# Patient Record
Sex: Male | Born: 1945 | Race: White | Hispanic: No | Smoking: Never smoker
Health system: Southern US, Community
[De-identification: ages and names within clinical notes are randomized; demographics above are authoritative.]

## PROBLEM LIST (undated history)

## (undated) DIAGNOSIS — M5126 Other intervertebral disc displacement, lumbar region: Secondary | ICD-10-CM

## (undated) DIAGNOSIS — I10 Essential (primary) hypertension: Secondary | ICD-10-CM

## (undated) DIAGNOSIS — M25569 Pain in unspecified knee: Secondary | ICD-10-CM

## (undated) DIAGNOSIS — G8929 Other chronic pain: Secondary | ICD-10-CM

## (undated) DIAGNOSIS — K219 Gastro-esophageal reflux disease without esophagitis: Secondary | ICD-10-CM

## (undated) DIAGNOSIS — F32A Depression, unspecified: Secondary | ICD-10-CM

## (undated) DIAGNOSIS — F329 Major depressive disorder, single episode, unspecified: Secondary | ICD-10-CM

## (undated) HISTORY — PX: KNEE SURGERY: SHX244

---

## 2004-09-15 ENCOUNTER — Ambulatory Visit: Payer: Self-pay | Admitting: Family Medicine

## 2004-10-06 ENCOUNTER — Ambulatory Visit: Payer: Self-pay | Admitting: Family Medicine

## 2004-11-09 ENCOUNTER — Ambulatory Visit: Payer: Self-pay | Admitting: Family Medicine

## 2004-12-10 ENCOUNTER — Ambulatory Visit: Payer: Self-pay | Admitting: Family Medicine

## 2005-02-04 ENCOUNTER — Ambulatory Visit: Payer: Self-pay | Admitting: Family Medicine

## 2011-02-17 ENCOUNTER — Emergency Department (HOSPITAL_COMMUNITY): Payer: Non-veteran care

## 2011-02-17 ENCOUNTER — Encounter: Payer: Self-pay | Admitting: *Deleted

## 2011-02-17 ENCOUNTER — Other Ambulatory Visit: Payer: Self-pay

## 2011-02-17 ENCOUNTER — Inpatient Hospital Stay (HOSPITAL_COMMUNITY): Payer: Non-veteran care

## 2011-02-17 ENCOUNTER — Inpatient Hospital Stay (HOSPITAL_COMMUNITY)
Admission: EM | Admit: 2011-02-17 | Discharge: 2011-02-20 | Disposition: A | Discharge status: Nursing Home (Includes ICF) | Payer: Non-veteran care | Source: Home / Self Care | Attending: General Surgery | Admitting: General Surgery

## 2011-02-17 DIAGNOSIS — E876 Hypokalemia: Secondary | ICD-10-CM | POA: Diagnosis not present

## 2011-02-17 DIAGNOSIS — F329 Major depressive disorder, single episode, unspecified: Secondary | ICD-10-CM | POA: Diagnosis present

## 2011-02-17 DIAGNOSIS — N189 Chronic kidney disease, unspecified: Secondary | ICD-10-CM | POA: Diagnosis not present

## 2011-02-17 DIAGNOSIS — F3289 Other specified depressive episodes: Secondary | ICD-10-CM | POA: Diagnosis present

## 2011-02-17 DIAGNOSIS — K922 Gastrointestinal hemorrhage, unspecified: Secondary | ICD-10-CM

## 2011-02-17 DIAGNOSIS — I129 Hypertensive chronic kidney disease with stage 1 through stage 4 chronic kidney disease, or unspecified chronic kidney disease: Secondary | ICD-10-CM | POA: Diagnosis present

## 2011-02-17 DIAGNOSIS — E872 Acidosis, unspecified: Secondary | ICD-10-CM | POA: Diagnosis not present

## 2011-02-17 DIAGNOSIS — T8183XA Persistent postprocedural fistula, initial encounter: Secondary | ICD-10-CM | POA: Diagnosis not present

## 2011-02-17 DIAGNOSIS — R55 Syncope and collapse: Secondary | ICD-10-CM

## 2011-02-17 DIAGNOSIS — Y849 Medical procedure, unspecified as the cause of abnormal reaction of the patient, or of later complication, without mention of misadventure at the time of the procedure: Secondary | ICD-10-CM | POA: Diagnosis present

## 2011-02-17 DIAGNOSIS — K659 Peritonitis, unspecified: Secondary | ICD-10-CM | POA: Diagnosis not present

## 2011-02-17 DIAGNOSIS — K265 Chronic or unspecified duodenal ulcer with perforation: Principal | ICD-10-CM | POA: Diagnosis present

## 2011-02-17 DIAGNOSIS — S0100XA Unspecified open wound of scalp, initial encounter: Secondary | ICD-10-CM | POA: Diagnosis present

## 2011-02-17 DIAGNOSIS — E871 Hypo-osmolality and hyponatremia: Secondary | ICD-10-CM | POA: Diagnosis not present

## 2011-02-17 DIAGNOSIS — I5032 Chronic diastolic (congestive) heart failure: Secondary | ICD-10-CM | POA: Diagnosis present

## 2011-02-17 DIAGNOSIS — R6520 Severe sepsis without septic shock: Secondary | ICD-10-CM | POA: Diagnosis not present

## 2011-02-17 DIAGNOSIS — S0101XA Laceration without foreign body of scalp, initial encounter: Secondary | ICD-10-CM

## 2011-02-17 DIAGNOSIS — Y9210 Unspecified residential institution as the place of occurrence of the external cause: Secondary | ICD-10-CM | POA: Diagnosis not present

## 2011-02-17 DIAGNOSIS — T82898A Other specified complication of vascular prosthetic devices, implants and grafts, initial encounter: Secondary | ICD-10-CM | POA: Diagnosis not present

## 2011-02-17 DIAGNOSIS — J9600 Acute respiratory failure, unspecified whether with hypoxia or hypercapnia: Secondary | ICD-10-CM | POA: Diagnosis not present

## 2011-02-17 DIAGNOSIS — D649 Anemia, unspecified: Secondary | ICD-10-CM

## 2011-02-17 DIAGNOSIS — E8809 Other disorders of plasma-protein metabolism, not elsewhere classified: Secondary | ICD-10-CM | POA: Diagnosis present

## 2011-02-17 DIAGNOSIS — K631 Perforation of intestine (nontraumatic): Secondary | ICD-10-CM

## 2011-02-17 DIAGNOSIS — A419 Sepsis, unspecified organism: Secondary | ICD-10-CM | POA: Diagnosis not present

## 2011-02-17 DIAGNOSIS — I509 Heart failure, unspecified: Secondary | ICD-10-CM | POA: Diagnosis present

## 2011-02-17 DIAGNOSIS — Y832 Surgical operation with anastomosis, bypass or graft as the cause of abnormal reaction of the patient, or of later complication, without mention of misadventure at the time of the procedure: Secondary | ICD-10-CM | POA: Diagnosis not present

## 2011-02-17 DIAGNOSIS — D62 Acute posthemorrhagic anemia: Secondary | ICD-10-CM | POA: Diagnosis not present

## 2011-02-17 DIAGNOSIS — T148XXA Other injury of unspecified body region, initial encounter: Secondary | ICD-10-CM

## 2011-02-17 DIAGNOSIS — W19XXXA Unspecified fall, initial encounter: Secondary | ICD-10-CM | POA: Diagnosis present

## 2011-02-17 HISTORY — DX: Depression, unspecified: F32.A

## 2011-02-17 HISTORY — DX: Major depressive disorder, single episode, unspecified: F32.9

## 2011-02-17 LAB — COMPREHENSIVE METABOLIC PANEL
BUN: 37 mg/dL — ABNORMAL HIGH (ref 6–23)
Calcium: 10.8 mg/dL — ABNORMAL HIGH (ref 8.4–10.5)
Creatinine, Ser: 1.54 mg/dL — ABNORMAL HIGH (ref 0.50–1.35)
GFR calc Af Amer: 53 mL/min — ABNORMAL LOW (ref >90)
Glucose, Bld: 183 mg/dL — ABNORMAL HIGH (ref 70–99)
Total Protein: 5.7 g/dL — ABNORMAL LOW (ref 6.0–8.3)

## 2011-02-17 LAB — BLOOD GAS, ARTERIAL
Acid-base deficit: 11.1 mmol/L — ABNORMAL HIGH (ref 0.0–2.0)
Drawn by: 21694
O2 Content: 2 L/min
Patient temperature: 37
pCO2 arterial: 36.3 mmHg (ref 35.0–45.0)

## 2011-02-17 LAB — DIFFERENTIAL
Basophils Absolute: 0 K/uL (ref 0.0–0.1)
Basophils Absolute: 0 10*3/uL (ref 0.0–0.1)
Basophils Relative: 0 % (ref 0–1)
Eosinophils Absolute: 0 10*3/uL (ref 0.0–0.7)
Eosinophils Relative: 0 % (ref 0–5)
Lymphocytes Relative: 6 % — ABNORMAL LOW (ref 12–46)
Lymphocytes Relative: 8 % — ABNORMAL LOW (ref 12–46)
Lymphs Abs: 0.5 10*3/uL — ABNORMAL LOW (ref 0.7–4.0)
Lymphs Abs: 0.9 10*3/uL (ref 0.7–4.0)
Monocytes Absolute: 0.5 K/uL (ref 0.1–1.0)
Monocytes Relative: 6 % (ref 3–12)
Monocytes Relative: 5 % (ref 3–12)
Neutro Abs: 6.5 K/uL (ref 1.7–7.7)
Neutrophils Relative %: 87 % — ABNORMAL HIGH (ref 43–77)

## 2011-02-17 LAB — COMPREHENSIVE METABOLIC PANEL WITH GFR
ALT: 12 U/L (ref 0–53)
AST: 17 U/L (ref 0–37)
Albumin: 3.2 g/dL — ABNORMAL LOW (ref 3.5–5.2)
Alkaline Phosphatase: 69 U/L (ref 39–117)
CO2: 21 meq/L (ref 19–32)
Chloride: 99 meq/L (ref 96–112)
GFR calc non Af Amer: 45 mL/min — ABNORMAL LOW (ref >90)
Potassium: 3.9 meq/L (ref 3.5–5.1)
Sodium: 135 meq/L (ref 135–145)
Total Bilirubin: 0.1 mg/dL — ABNORMAL LOW (ref 0.3–1.2)

## 2011-02-17 LAB — PROTIME-INR
INR: 1.13 (ref 0.00–1.49)
INR: 1.36 (ref 0.00–1.49)
Prothrombin Time: 14.7 s (ref 11.6–15.2)

## 2011-02-17 LAB — URINALYSIS, ROUTINE W REFLEX MICROSCOPIC
Glucose, UA: NEGATIVE mg/dL
Hgb urine dipstick: NEGATIVE
Ketones, ur: NEGATIVE mg/dL
Leukocytes, UA: NEGATIVE
Nitrite: NEGATIVE
Protein, ur: NEGATIVE mg/dL
Specific Gravity, Urine: 1.025 (ref 1.005–1.030)
Urobilinogen, UA: 0.2 mg/dL (ref 0.0–1.0)
pH: 5 (ref 5.0–8.0)

## 2011-02-17 LAB — SAMPLE TO BLOOD BANK

## 2011-02-17 LAB — HEPATIC FUNCTION PANEL
ALT: 7 U/L (ref 0–53)
AST: 14 U/L (ref 0–37)
Alkaline Phosphatase: 50 U/L (ref 39–117)
Bilirubin, Direct: 0.1 mg/dL (ref 0.0–0.3)
Total Bilirubin: 0.1 mg/dL — ABNORMAL LOW (ref 0.3–1.2)

## 2011-02-17 LAB — CBC
HCT: 29.9 % — ABNORMAL LOW (ref 39.0–52.0)
HCT: 30.1 % — ABNORMAL LOW (ref 39.0–52.0)
Hemoglobin: 9.6 g/dL — ABNORMAL LOW (ref 13.0–17.0)
Hemoglobin: 9.3 g/dL — ABNORMAL LOW (ref 13.0–17.0)
MCH: 29.9 pg (ref 26.0–34.0)
MCH: 29.2 pg (ref 26.0–34.0)
MCHC: 32.1 g/dL (ref 30.0–36.0)
MCHC: 30.9 g/dL (ref 30.0–36.0)
MCV: 93.1 fL (ref 78.0–100.0)
Platelets: 556 K/uL — ABNORMAL HIGH (ref 150–400)
Platelets: 564 10*3/uL — ABNORMAL HIGH (ref 150–400)
RBC: 3.21 MIL/uL — ABNORMAL LOW (ref 4.22–5.81)
RBC: 3.18 MIL/uL — ABNORMAL LOW (ref 4.22–5.81)
RDW: 14.1 % (ref 11.5–15.5)
RDW: 14.2 % (ref 11.5–15.5)
WBC: 7.5 K/uL (ref 4.0–10.5)

## 2011-02-17 LAB — OCCULT BLOOD, POC DEVICE: Fecal Occult Bld: POSITIVE

## 2011-02-17 LAB — LACTATE DEHYDROGENASE: LDH: 213 U/L (ref 94–250)

## 2011-02-17 LAB — APTT: aPTT: 27 seconds (ref 24–37)

## 2011-02-17 MED ORDER — SODIUM CHLORIDE 0.9 % IJ SOLN
INTRAMUSCULAR | Status: AC
  Filled 2011-02-17: qty 10

## 2011-02-17 MED ORDER — ENOXAPARIN SODIUM 40 MG/0.4ML ~~LOC~~ SOLN
40.0000 mg | SUBCUTANEOUS | Status: DC
  Administered 2011-02-18: 40 mg via SUBCUTANEOUS
  Filled 2011-02-17: qty 0.4

## 2011-02-17 MED ORDER — SODIUM CHLORIDE 0.45 % IV SOLN
INTRAVENOUS | Status: DC
  Administered 2011-02-17: 250 mL/h via INTRAVENOUS

## 2011-02-17 MED ORDER — SODIUM CHLORIDE 0.9 % IV SOLN: Freq: Once | INTRAVENOUS | Status: DC

## 2011-02-17 MED ORDER — HYDROMORPHONE HCL 1 MG/ML IJ SOLN
1.0000 mg | INTRAMUSCULAR | Status: DC | PRN
  Administered 2011-02-17 – 2011-02-20 (×10): 1 mg via INTRAVENOUS
  Filled 2011-02-17 (×10): qty 1

## 2011-02-17 MED ORDER — LEVALBUTEROL HCL 0.63 MG/3ML IN NEBU
0.6300 mg | INHALATION_SOLUTION | RESPIRATORY_TRACT | Status: DC | PRN
  Administered 2011-02-17 – 2011-02-18 (×3): 0.63 mg via RESPIRATORY_TRACT
  Filled 2011-02-17 (×3): qty 3

## 2011-02-17 MED ORDER — SODIUM CHLORIDE 0.9 % IV SOLN
Freq: Once | INTRAVENOUS | Status: AC
  Administered 2011-02-17: via INTRAVENOUS

## 2011-02-17 MED ORDER — SODIUM CHLORIDE 0.9 % IV BOLUS (SEPSIS)
1000.0000 mL | Freq: Once | INTRAVENOUS | Status: AC
  Administered 2011-02-17: 1000 mL via INTRAVENOUS

## 2011-02-17 MED ORDER — PANTOPRAZOLE SODIUM 40 MG IV SOLR
40.0000 mg | INTRAVENOUS | Status: DC
  Administered 2011-02-18 – 2011-02-19 (×2): 40 mg via INTRAVENOUS
  Filled 2011-02-17 (×2): qty 40

## 2011-02-17 MED ORDER — PIPERACILLIN-TAZOBACTAM 3.375 G IVPB
3.3750 g | Freq: Three times a day (TID) | INTRAVENOUS | Status: DC
  Administered 2011-02-17 – 2011-02-19 (×5): 3.375 g via INTRAVENOUS
  Filled 2011-02-17 (×6): qty 50

## 2011-02-17 MED ORDER — IOHEXOL 300 MG/ML  SOLN
100.0000 mL | Freq: Once | INTRAMUSCULAR | Status: AC | PRN
  Administered 2011-02-17: 100 mL via INTRAVENOUS

## 2011-02-17 MED ORDER — ONDANSETRON HCL 4 MG/2ML IJ SOLN: 4.0000 mg | Freq: Four times a day (QID) | INTRAMUSCULAR | Status: DC | PRN

## 2011-02-17 MED ORDER — DEXTROSE-NACL 5-0.9 % IV SOLN
INTRAVENOUS | Status: DC
  Administered 2011-02-17 – 2011-02-18 (×6): via INTRAVENOUS

## 2011-02-17 MED ORDER — SODIUM CHLORIDE 0.9 % IV SOLN
80.0000 mg | Freq: Once | INTRAVENOUS | Status: AC
  Administered 2011-02-17: 80 mg via INTRAVENOUS
  Filled 2011-02-17: qty 80

## 2011-02-17 MED ORDER — SODIUM CHLORIDE 0.9 % IJ SOLN
10.0000 mL | INTRAMUSCULAR | Status: DC | PRN
  Administered 2011-02-18 – 2011-02-19 (×3): 10 mL
  Filled 2011-02-17 (×2): qty 10
  Filled 2011-02-17: qty 20
  Filled 2011-02-17: qty 10

## 2011-02-17 MED ORDER — SODIUM CHLORIDE 0.9 % IV SOLN
Freq: Once | INTRAVENOUS | Status: AC
  Administered 2011-02-17: 18:00:00 via INTRAVENOUS

## 2011-02-17 MED ORDER — FENTANYL CITRATE 0.05 MG/ML IJ SOLN
50.0000 ug | Freq: Once | INTRAMUSCULAR | Status: AC
  Administered 2011-02-17: 50 ug via INTRAVENOUS
  Filled 2011-02-17: qty 2

## 2011-02-17 MED ORDER — SODIUM CHLORIDE 0.9 % IJ SOLN
10.0000 mL | Freq: Two times a day (BID) | INTRAMUSCULAR | Status: DC
  Administered 2011-02-18 – 2011-02-19 (×2): 10 mL
  Filled 2011-02-17 (×2): qty 10

## 2011-02-17 NOTE — H&P (Signed)
Subjective:   Patient is a 65 y.o. male presents with abdominal pain. He states he has had abdominal pain over the last 3-4 weeks. He has been taking both Naprosyn and Kaopectate to help out with the upper abdominal pain. Has had several episodes syncope without loss of consciousness. He called EMS and was brought to the hospital for further evaluation treatment. An abdominal series was performed by the ER which revealed pneumoperitoneum. Surgical consultation was obtained. The patient normally receives care through the hospital system. There are no active problems to display for this patient.  Past Medical History  Diagnosis Date  . Depression     History reviewed. No pertinent past surgical history.   (Not in a hospital admission) Allergies  Allergen Reactions  . Eggs Or Egg-Derived Products     History  Substance Use Topics  . Smoking status: Never Smoker   . Smokeless tobacco: Not on file  . Alcohol Use: No    History reviewed. No pertinent family history.  Review of Systems A comprehensive review of systems was negative.  Objective:   Patient Vitals for the past 8 hrs:  BP Temp Temp src Pulse Resp SpO2 Height Weight  02/17/11 1311 101/71 mmHg - - 108  20  95 % - -  02/17/11 1057 115/61 mmHg - - 86  24  95 % - -  02/17/11 0933 113/66 mmHg 97.6 F (36.4 C) Oral 97  20  97 % 5\' 6"  (1.676 m) 81.647 kg (180 lb)       Well-developed well-nourished white male in no acute distress. HEENT examination: Abrasion to the right forehead. No hematomas noted. Physical reactive. Neck: Supple without lymphadenopathy Lungs: Clear to auscultation with equal breath sounds bilaterally. Heart: Regular rate and rhythm without S3, S4, murmurs Abdomen: Soft, flat. No rigidity noted. No hepatosplenomegaly or masses are noted. Nonspecific tenderness noted in the right upper quadrant and epigastric region. Rectal: Deferred at this time.  .  Data Review:  Results for orders placed during the  hospital encounter of 02/17/11 (from the past 48 hour(s))  CBC     Status: Abnormal   Collection Time   02/17/11 10:25 AM      Component Value Range Comment   WBC 7.5  4.0 - 10.5 (K/uL)    RBC 3.21 (*) 4.22 - 5.81 (MIL/uL)    Hemoglobin 9.6 (*) 13.0 - 17.0 (g/dL)    HCT 04.5 (*) 40.9 - 52.0 (%)    MCV 93.1  78.0 - 100.0 (fL)    MCH 29.9  26.0 - 34.0 (pg)    MCHC 32.1  30.0 - 36.0 (g/dL)    RDW 81.1  91.4 - 78.2 (%)    Platelets 556 (*) 150 - 400 (K/uL)   DIFFERENTIAL     Status: Abnormal   Collection Time   02/17/11 10:25 AM      Component Value Range Comment   Neutrophils Relative 87 (*) 43 - 77 (%)    Neutro Abs 6.5  1.7 - 7.7 (K/uL)    Lymphocytes Relative 6 (*) 12 - 46 (%)    Lymphs Abs 0.5 (*) 0.7 - 4.0 (K/uL)    Monocytes Relative 6  3 - 12 (%)    Monocytes Absolute 0.5  0.1 - 1.0 (K/uL)    Eosinophils Relative 0  0 - 5 (%)    Eosinophils Absolute 0.0  0.0 - 0.7 (K/uL)    Basophils Relative 0  0 - 1 (%)    Basophils  Absolute 0.0  0.0 - 0.1 (K/uL)   COMPREHENSIVE METABOLIC PANEL     Status: Abnormal   Collection Time   02/17/11 10:25 AM      Component Value Range Comment   Sodium 135  135 - 145 (mEq/L)    Potassium 3.9  3.5 - 5.1 (mEq/L)    Chloride 99  96 - 112 (mEq/L)    CO2 21  19 - 32 (mEq/L)    Glucose, Bld 183 (*) 70 - 99 (mg/dL)    BUN 37 (*) 6 - 23 (mg/dL)    Creatinine, Ser 1.61 (*) 0.50 - 1.35 (mg/dL)    Calcium 09.6 (*) 8.4 - 10.5 (mg/dL)    Total Protein 5.7 (*) 6.0 - 8.3 (g/dL)    Albumin 3.2 (*) 3.5 - 5.2 (g/dL)    AST 17  0 - 37 (U/L)    ALT 12  0 - 53 (U/L)    Alkaline Phosphatase 69  39 - 117 (U/L)    Total Bilirubin 0.1 (*) 0.3 - 1.2 (mg/dL)    GFR calc non Af Amer 45 (*) >90 (mL/min)    GFR calc Af Amer 53 (*) >90 (mL/min)   APTT     Status: Normal   Collection Time   02/17/11 10:25 AM      Component Value Range Comment   aPTT 27  24 - 37 (seconds)   PROTIME-INR     Status: Normal   Collection Time   02/17/11 10:25 AM      Component  Value Range Comment   Prothrombin Time 14.7  11.6 - 15.2 (seconds)    INR 1.13  0.00 - 1.49    SAMPLE TO BLOOD BANK     Status: Normal   Collection Time   02/17/11 10:25 AM      Component Value Range Comment   Blood Bank Specimen SAMPLE AVAILABLE FOR TESTING      Sample Expiration 02/20/2011     OCCULT BLOOD, POC DEVICE     Status: Normal   Collection Time   02/17/11 10:46 AM      Component Value Range Comment   Fecal Occult Bld POSITIVE     URINALYSIS, ROUTINE W REFLEX MICROSCOPIC     Status: Abnormal   Collection Time   02/17/11 10:48 AM      Component Value Range Comment   Color, Urine YELLOW  YELLOW     Appearance CLEAR  CLEAR     Specific Gravity, Urine 1.025  1.005 - 1.030     pH 5.0  5.0 - 8.0     Glucose, UA NEGATIVE  NEGATIVE (mg/dL)    Hgb urine dipstick NEGATIVE  NEGATIVE     Bilirubin Urine SMALL (*) NEGATIVE     Ketones, ur NEGATIVE  NEGATIVE (mg/dL)    Protein, ur NEGATIVE  NEGATIVE (mg/dL)    Urobilinogen, UA 0.2  0.0 - 1.0 (mg/dL)    Nitrite NEGATIVE  NEGATIVE     Leukocytes, UA NEGATIVE  NEGATIVE  MICROSCOPIC NOT DONE ON URINES WITH NEGATIVE PROTEIN, BLOOD, LEUKOCYTES, NITRITE, OR GLUCOSE <1000 mg/dL.   CT scan of the abdomen and pelvis with oral contrast: Pneumoperitoneum with fluid noted along the right hepatic gutter and pelvis. Question of some contrast extravasation.  Thickened duodenal anterior bulb, consistent with perforated duodenal ulcer  Assessment:  Perforated duodenal ulcer Orthostatic hypotension NSAIDS abuse Active Problems:  * No active hospital problems. *    Plan:  Remnant the patient to the hospital  for intravenous hydration and to start IV antibiotics. He subsequently will perform an exploratory laparotomy, and a Graham plication. Risks and benefits of the procedure including bleeding, infection, and recurrence of the ulcer were fully explained to the patient, gave informed consent. The [facility] was contacted, but Gen. surgery declined  the transfer. The stated that if he needed surgery this could be done at [facility]. This was told to the patient.

## 2011-02-17 NOTE — Progress Notes (Signed)
eLink Physician-Brief Progress Note Patient Name: Jason Holt DOB: Mar 28, 1946 MRN: [id number]  Date of Service  02/17/2011   HPI/Events of Note   Patient with DU perf just moved to ICU. Noted to be hypotensive and triggered sepsis alert. Per eRN patient due to trip to OR tomorrow. PAtient sbp 85 currently. Looks ill on camera exam  eICU Interventions  .1. Stat fluid bolus 2. Stat labs   Intervention Category Major Interventions: Shock - evaluation and management  Stiven Kaspar 02/17/2011, 9:46 PM

## 2011-02-17 NOTE — ED Notes (Signed)
Pt c/o low abdominal pain, black stools and weakness x 3 weeks. Pt states that he has been taking a lot of pepto bismol for nausea. Pt alert and oriented x 3.

## 2011-02-17 NOTE — Procedures (Signed)
Central Venous Catheter Insertion Procedure Note Jason Holt [id number] Nov 19, 1945  Procedure: Insertion of Central Venous Catheter Indications: Assessment of intravascular volume  Procedure Details Consent: Risks of procedure as well as the alternatives and risks of each were explained to the (patient/caregiver).  Consent for procedure obtained. Time Out: Verified patient identification, verified procedure, site/side was marked, verified correct patient position, special equipment/implants available, medications/allergies/relevent history reviewed, required imaging and test results available.  Performed  Maximum sterile technique was used including antiseptics, gloves, gown, hand hygiene, mask and sheet. Skin prep: Chlorhexidine; local anesthetic administered A antimicrobial bonded/coated triple lumen catheter was placed in the left subclavian vein using the Seldinger technique.  Evaluation Blood flow good Complications: No apparent complications Patient did tolerate procedure well. Chest X-ray ordered to verify placement.  CXR: pending.  Jason Holt A 02/17/2011, 10:12 PM

## 2011-02-17 NOTE — ED Provider Notes (Addendum)
History   Chart scribed for Gavin Pound. Joshus Rogan, MD by Enos Fling; the patient was seen in room [facility]; this patient's care was started at 10:10 AM.    CSN: [id number] Arrival date & time: 02/17/2011  9:42 AM  Chief Complaint  Patient presents with  . Fatigue    HPI Jason Holt is a 65 y.o. male who presents to the Emergency Department complaining of abd pain and recurrent falls. Pt states he has had persistent abd pain, worse on the right, for the past 4-5 weeks. Pain is unchanged from onset and is non-radiating. Pt has been taking aleve and naproxen for pain as well as pepto-bismol for nausea. No vomiting or diarrhea. Family reports pt has also been c/o generalized weakness and fatigue for several weeks with multiple syncopal episodes that are worse with standing and exertion. Pt had 3 episodes of falling this AM because he feels weak; no LOC today. +head injury with laceration to right temporal area; also with skin tears to his right forearm that his wife bandaged at home pta. Denies neck pain, back pain or any other injury from fall. Pt admits to black stools and intermittent bright red rectal bleeding for several weeks. Pt takes iron daily. Family states pt appears pale. Reports h/o ulcers but denies h/o endoscopies. Pt is not taking blood thinners.   Past Medical History  Diagnosis Date  . Depression     History reviewed. No pertinent past surgical history.  History reviewed. No pertinent family history.  History  Substance Use Topics  . Smoking status: Never Smoker   . Smokeless tobacco: Not on file  . Alcohol Use: No      Review of Systems 10 Systems reviewed and are negative for acute change except as noted in the HPI.  Allergies  Eggs or egg-derived products  Home Medications   Current Outpatient Rx  Name Route Sig Dispense Refill  . CALCIUM CARB-CHOLECALCIFEROL 500-200 MG-UNIT PO TABS Oral Take 1 tablet by mouth 2 (two) times daily.      Marland Kitchen FERROUS  FUMARATE 325 (106 FE) MG PO TABS Oral Take 1 tablet by mouth daily.      Marland Kitchen NAPROXEN 500 MG PO TABS Oral Take 500 mg by mouth 2 (two) times daily as needed. Pain     . SERTRALINE HCL 100 MG PO TABS Oral Take 100 mg by mouth daily.      Marland Kitchen VITAMIN B-12 500 MCG PO TABS Oral Take 1,000 mcg by mouth daily.        BP 115/61  Pulse 86  Temp(Src) 97.6 F (36.4 C) (Oral)  Resp 24  Ht 5\' 6"  (1.676 m)  Wt 180 lb (81.647 kg)  BMI 29.05 kg/m2  SpO2 95%  Physical Exam  Nursing note and vitals reviewed. Constitutional: He is oriented to person, place, and time. He appears well-developed and well-nourished. No distress.  HENT:  Head: Normocephalic.  Right Ear: External ear normal.  Left Ear: External ear normal.  Nose: Nose normal.  Mouth/Throat: Oropharynx is clear and moist.       Laceration to right temporal scalp with surrounding contusion  Eyes:       Conjunctival pallor  Neck: Neck supple.       Neck nontender  Cardiovascular: Normal rate and regular rhythm.   Pulmonary/Chest: Effort normal and breath sounds normal.  Abdominal: Soft. There is tenderness.  Genitourinary:       Rectal exam: black stool, no hemorrhoids, normal tone, prostate nontender  Musculoskeletal: Normal range of motion.       Normal pulses  Neurological: He is alert and oriented to person, place, and time.       Motor intact in all extremities  Skin: Skin is warm and dry. There is pallor.       Skin tear to dorsum of right hand and right forearm  Psychiatric: He has a normal mood and affect.    ED Course  CRITICAL CARE Performed by: Lear Ng Authorized by: Lear Ng Total critical care time: 30 minutes Critical care time was exclusive of separately billable procedures and treating other patients. Critical care was necessary to treat or prevent imminent or life-threatening deterioration of the following conditions: circulatory failure and shock. Critical care was time spent personally by me on  the following activities: development of treatment plan with patient or surrogate, evaluation of patient's response to treatment, examination of patient, obtaining history from patient or surrogate, ordering and performing treatments and interventions, ordering and review of laboratory studies, ordering and review of radiographic studies, re-evaluation of patient's condition and review of old charts.  LACERATION REPAIR Date/Time: 02/17/2011 10:56 AM Performed by: Lear Ng Authorized by: Lear Ng Consent: Verbal consent obtained. Consent given by: patient Patient understanding: patient states understanding of the procedure being performed Patient consent: the patient's understanding of the procedure matches consent given Patient identity confirmed: verbally with patient Time out: Immediately prior to procedure a "time out" was called to verify the correct patient, procedure, equipment, support staff and site/side marked as required. Body area: head/neck Location details: scalp Tendon involvement: none Nerve involvement: none Vascular damage: no Irrigation solution: saline Amount of cleaning: standard Skin closure: glue Approximation: close Approximation difficulty: simple Patient tolerance: Patient tolerated the procedure well with no immediate complications.   - none  Labs Reviewed  CBC - Abnormal; Notable for the following:    RBC 3.21 (*)    Hemoglobin 9.6 (*)    HCT 29.9 (*)    Platelets 556 (*)    All other components within normal limits  DIFFERENTIAL - Abnormal; Notable for the following:    Neutrophils Relative 87 (*)    Lymphocytes Relative 6 (*)    Lymphs Abs 0.5 (*)    All other components within normal limits  COMPREHENSIVE METABOLIC PANEL - Abnormal; Notable for the following:    Glucose, Bld 183 (*)    BUN 37 (*)    Creatinine, Ser 1.54 (*)    Calcium 10.8 (*)    Total Protein 5.7 (*)    Albumin 3.2 (*)    Total Bilirubin 0.1 (*)    GFR calc  non Af Amer 45 (*)    GFR calc Af Amer 53 (*)    All other components within normal limits  URINALYSIS, ROUTINE W REFLEX MICROSCOPIC - Abnormal; Notable for the following:    Bilirubin Urine SMALL (*)    All other components within normal limits  APTT  PROTIME-INR  SAMPLE TO BLOOD BANK  OCCULT BLOOD, POC DEVICE  POCT OCCULT BLOOD STOOL, DEVICE   Dg Abd Acute W/chest  02/17/2011  *RADIOLOGY REPORT*  Clinical Data: Abdominal pain and GI bleed.  ACUTE ABDOMEN SERIES (ABDOMEN 2 VIEW & CHEST 1 VIEW)  Comparison: None.  Findings: Frontal view of the chest shows midline trachea.  Heart size is accentuated by low lung volumes and AP technique.  Linear densities are seen in both lung bases.  Lucencies are seen beneath the hemidiaphragms bilaterally.  Two views of the abdomen show gas and stool in the colon.  No small bowel dilatation.  IMPRESSION:  1.  Pneumoperitoneum. Critical Value/emergent results were called by telephone at the time of interpretation on 02/17/2011  at 1210 hours  to  Dr. Oletta Lamas, who verbally acknowledged these results. 2.  Bibasilar atelectasis and/or scarring.  Original Report Authenticated By: Reyes Ivan, M.D.     MDM    Weak, fatigue, with near syncope.  Pt with abd pain for 4-5 weeks, persistent, takign naprosyn and now stools bloody per family, here for me, was black.  Pt is taknig iron and pepto bismol twice a day for 4-5 days which may make interpretation difficult.  However MM are pale, I ssupect anemia from bleeding ulcer as cause of symptoms and abd pain.  Will need admit, will order IVF's and IV protonix for now.  No neck tenderness or pain.  No LOC or vomiting and not on coumadin or plavix, so I don't think head CT is needed.  Will dermabond scalp lac.     IMPRESSION: 1. GI bleeding   2. Anemia   3. Near syncope   4. Scalp laceration   5. Abrasion   6. Intestinal perforation     SCRIBE ATTESTATION: I personally performed the services described in this  documentation, which was scribed in my presence. The recorded information has been reviewed and considered. Macil Crady Y.   11:47 AM ECG time 11:23, NSR at rate 90, incomplete RBBB, normal axis, normal ST and T wave segments   12:10 PM X-ray results received from radiologist, free air present  12:21 PM Case discussed with on call surgeon Dr. Lovell Sheehan  1:02 PM Spoke to Dr. Laurell Josephs at [facility] who agrees that pt can stay here at least until tomorrow to be resuscitated and stabilized.  He would advise that if pt required surgery that it could be done here at [facility].  If he is transferred tomorrow, [facility] could [facility] directly since he has already spoken to me.  I will let Dr. Lovell Sheehan know and CT is pending.    Gavin Pound. Oletta Lamas, MD 02/17/11 1303     Gavin Pound. Oletta Lamas, MD 02/24/11 2152

## 2011-02-17 NOTE — Progress Notes (Addendum)
Patient is a 65 yr old male that arrived to dept 300,very diaphoretic,weak,with severe abd pain.B/P 89/62,hr 102,temp 96.9,Dr Lovell Sheehan notified.Orders received,and given.Patient received1 liter NS bolus,NG tube 16 fr inserted to rt nare,patient tolerated well,positive for placement,connected to moderate intermittent suction.Dilaudid1 mg given for pain. Orders to transfer to step down unit. Patient reassessed B/P 107/72,hr 77, pain level a 4 now,Dr Lovell Sheehan notified.Report called,and given to [facility] RN ICU nurse.Transferred to ICU bed 7,patient stable. Family aware.

## 2011-02-17 NOTE — ED Notes (Signed)
Pt states his knees gave out and fell this morning. Pain to right rib area. Pt states hx of knee problems since [employer]. Pt states intermittent nausea and upper abd pain x 3 weeks. NAD at this time.

## 2011-02-18 ENCOUNTER — Encounter (HOSPITAL_COMMUNITY)
Admission: EM | Disposition: A | Discharge status: Nursing Home (Includes ICF) | Payer: Self-pay | Source: Home / Self Care | Attending: General Surgery

## 2011-02-18 ENCOUNTER — Inpatient Hospital Stay (HOSPITAL_COMMUNITY): Payer: Non-veteran care

## 2011-02-18 ENCOUNTER — Encounter (HOSPITAL_COMMUNITY): Payer: Self-pay | Admitting: Anesthesiology

## 2011-02-18 ENCOUNTER — Inpatient Hospital Stay (HOSPITAL_COMMUNITY): Payer: Non-veteran care | Admitting: Anesthesiology

## 2011-02-18 DIAGNOSIS — I517 Cardiomegaly: Secondary | ICD-10-CM

## 2011-02-18 HISTORY — PX: LAPAROTOMY: SHX154

## 2011-02-18 LAB — BLOOD GAS, ARTERIAL
Acid-base deficit: 9.6 mmol/L — ABNORMAL HIGH (ref 0.0–2.0)
Acid-base deficit: 9.8 mmol/L — ABNORMAL HIGH (ref 0.0–2.0)
Acid-base deficit: 11.1 mmol/L — ABNORMAL HIGH (ref 0.0–2.0)
Bicarbonate: 13.2 mEq/L — ABNORMAL LOW (ref 20.0–24.0)
Bicarbonate: 14.7 mEq/L — ABNORMAL LOW (ref 20.0–24.0)
Bicarbonate: 14.8 mEq/L — ABNORMAL LOW (ref 20.0–24.0)
Drawn by: 22874
Drawn by: 21310
FIO2: 50 %
FIO2: 40 %
MECHVT: 520 mL
MECHVT: 470 mL
O2 Content: 3 L/min
O2 Saturation: 95.9 %
O2 Saturation: 97 %
PEEP: 8 cmH2O
PEEP: 5 cmH2O
Patient temperature: 37
RATE: 18 resp/min
RATE: 23 resp/min
TCO2: 13.5 mmol/L (ref 0–100)
TCO2: 13.5 mmol/L (ref 0–100)
TCO2: 13.4 mmol/L (ref 0–100)
pCO2 arterial: 30.3 mmHg — ABNORMAL LOW (ref 35.0–45.0)
pCO2 arterial: 27.3 mmHg — ABNORMAL LOW (ref 35.0–45.0)
pCO2 arterial: 27.4 mmHg — ABNORMAL LOW (ref 35.0–45.0)
pH, Arterial: 7.062 — CL (ref 7.350–7.450)
pH, Arterial: 7.322 — ABNORMAL LOW (ref 7.350–7.450)
pH, Arterial: 7.351 (ref 7.350–7.450)
pO2, Arterial: 62 mmHg — ABNORMAL LOW (ref 80.0–100.0)
pO2, Arterial: 85.7 mmHg (ref 80.0–100.0)
pO2, Arterial: 78.6 mmHg — ABNORMAL LOW (ref 80.0–100.0)
pO2, Arterial: 90.4 mmHg (ref 80.0–100.0)

## 2011-02-18 LAB — CBC
MCV: 95.2 fL (ref 78.0–100.0)
Platelets: 606 10*3/uL — ABNORMAL HIGH (ref 150–400)
Platelets: 506 10*3/uL — ABNORMAL HIGH (ref 150–400)
RBC: 3.12 MIL/uL — ABNORMAL LOW (ref 4.22–5.81)
RDW: 15.3 % (ref 11.5–15.5)
WBC: 11 10*3/uL — ABNORMAL HIGH (ref 4.0–10.5)
WBC: 9.4 10*3/uL (ref 4.0–10.5)

## 2011-02-18 LAB — HEPATIC FUNCTION PANEL
Alkaline Phosphatase: 54 U/L (ref 39–117)
Bilirubin, Direct: 0.2 mg/dL (ref 0.0–0.3)
Indirect Bilirubin: 0 mg/dL — ABNORMAL LOW (ref 0.3–0.9)
Total Bilirubin: 0.2 mg/dL — ABNORMAL LOW (ref 0.3–1.2)

## 2011-02-18 LAB — BASIC METABOLIC PANEL
BUN: 40 mg/dL — ABNORMAL HIGH (ref 6–23)
BUN: 41 mg/dL — ABNORMAL HIGH (ref 6–23)
BUN: 49 mg/dL — ABNORMAL HIGH (ref 6–23)
CO2: 16 mEq/L — ABNORMAL LOW (ref 19–32)
CO2: 18 mEq/L — ABNORMAL LOW (ref 19–32)
Calcium: 8 mg/dL — ABNORMAL LOW (ref 8.4–10.5)
Chloride: 110 mEq/L (ref 96–112)
Creatinine, Ser: 2.21 mg/dL — ABNORMAL HIGH (ref 0.50–1.35)
Creatinine, Ser: 3.04 mg/dL — ABNORMAL HIGH (ref 0.50–1.35)
GFR calc Af Amer: 37 mL/min — ABNORMAL LOW (ref >90)
GFR calc Af Amer: 23 mL/min — ABNORMAL LOW (ref >90)
GFR calc non Af Amer: 20 mL/min — ABNORMAL LOW (ref >90)
Glucose, Bld: 187 mg/dL — ABNORMAL HIGH (ref 70–99)
Glucose, Bld: 236 mg/dL — ABNORMAL HIGH (ref 70–99)
Potassium: 4.2 mEq/L (ref 3.5–5.1)

## 2011-02-18 LAB — DIFFERENTIAL
Basophils Absolute: 0 10*3/uL (ref 0.0–0.1)
Lymphocytes Relative: 11 % — ABNORMAL LOW (ref 12–46)
Lymphs Abs: 1 10*3/uL (ref 0.7–4.0)
Monocytes Relative: 9 % (ref 3–12)
Neutrophils Relative %: 80 % — ABNORMAL HIGH (ref 43–77)

## 2011-02-18 LAB — LACTIC ACID, PLASMA: Lactic Acid, Venous: 2.5 mmol/L — ABNORMAL HIGH (ref 0.5–2.2)

## 2011-02-18 LAB — PHOSPHORUS: Phosphorus: 5.8 mg/dL — ABNORMAL HIGH (ref 2.3–4.6)

## 2011-02-18 LAB — CARBOXYHEMOGLOBIN
Total hemoglobin: 11.1 g/dL — ABNORMAL LOW (ref 13.5–18.0)
Total oxygen content: 10.1 mL/dL — ABNORMAL LOW (ref 15.0–23.0)

## 2011-02-18 SURGERY — LAPAROTOMY, EXPLORATORY
Anesthesia: General | Site: Abdomen | Wound class: Dirty or Infected

## 2011-02-18 MED ORDER — CHLORHEXIDINE GLUCONATE CLOTH 2 % EX PADS
6.0000 | MEDICATED_PAD | Freq: Every day | CUTANEOUS | Status: DC
  Administered 2011-02-18 – 2011-02-19 (×2): 6 via TOPICAL

## 2011-02-18 MED ORDER — LACTATED RINGERS IV SOLN
INTRAVENOUS | Status: DC
  Administered 2011-02-18: 20:00:00 via INTRAVENOUS

## 2011-02-18 MED ORDER — NOREPINEPHRINE BITARTRATE 1 MG/ML IJ SOLN
2.0000 ug/min | INTRAVENOUS | Status: DC
  Administered 2011-02-18: 4 ug/min via INTRAVENOUS
  Administered 2011-02-18: 22 ug/min via INTRAVENOUS
  Filled 2011-02-18: qty 4

## 2011-02-18 MED ORDER — SODIUM CHLORIDE 0.9 % IV BOLUS (SEPSIS)
1000.0000 mL | Freq: Once | INTRAVENOUS | Status: AC
  Administered 2011-02-18: 1000 mL via INTRAVENOUS

## 2011-02-18 MED ORDER — MUPIROCIN CALCIUM 2 % EX CREA
TOPICAL_CREAM | Freq: Two times a day (BID) | CUTANEOUS | Status: DC
  Administered 2011-02-18: 23:00:00 via TOPICAL
  Administered 2011-02-18: 1 via TOPICAL
  Administered 2011-02-19 (×2): via TOPICAL
  Filled 2011-02-18: qty 15

## 2011-02-18 MED ORDER — EPHEDRINE SULFATE 50 MG/ML IJ SOLN: INTRAMUSCULAR | Status: DC | PRN

## 2011-02-18 MED ORDER — PROPOFOL 10 MG/ML IV EMUL
INTRAVENOUS | Status: AC
  Filled 2011-02-18: qty 20

## 2011-02-18 MED ORDER — ACETAMINOPHEN 10 MG/ML IV SOLN
1000.0000 mg | Freq: Four times a day (QID) | INTRAVENOUS | Status: AC
  Administered 2011-02-18 – 2011-02-19 (×4): 1000 mg via INTRAVENOUS
  Filled 2011-02-18 (×4): qty 100

## 2011-02-18 MED ORDER — SODIUM CHLORIDE 0.9 % IV SOLN
INTRAVENOUS | Status: DC | PRN
  Administered 2011-02-18: 13:00:00 via INTRAVENOUS

## 2011-02-18 MED ORDER — DOPAMINE-DEXTROSE 3.2-5 MG/ML-% IV SOLN
INTRAVENOUS | Status: AC
  Filled 2011-02-18: qty 250

## 2011-02-18 MED ORDER — SODIUM CHLORIDE 0.9 % IR SOLN
Status: DC | PRN
  Administered 2011-02-18: 5000 mL

## 2011-02-18 MED ORDER — SODIUM CHLORIDE 0.9 % IV BOLUS (SEPSIS)
1000.0000 mL | Freq: Once | INTRAVENOUS | Status: AC
Start: 2011-02-18 — End: 2011-02-18
  Administered 2011-02-18: 1000 mL via INTRAVENOUS

## 2011-02-18 MED ORDER — LIDOCAINE HCL (CARDIAC) 20 MG/ML IV SOLN
INTRAVENOUS | Status: AC
  Filled 2011-02-18: qty 5

## 2011-02-18 MED ORDER — POVIDONE-IODINE 10 % EX OINT
TOPICAL_OINTMENT | CUTANEOUS | Status: AC
  Filled 2011-02-18: qty 2

## 2011-02-18 MED ORDER — FENTANYL CITRATE 0.05 MG/ML IJ SOLN
INTRAMUSCULAR | Status: AC
  Filled 2011-02-18: qty 5

## 2011-02-18 MED ORDER — SODIUM BICARBONATE 8.4 % IV SOLN: 50.0000 meq | Freq: Once | INTRAVENOUS | Status: DC

## 2011-02-18 MED ORDER — ACETAMINOPHEN 10 MG/ML IV SOLN
INTRAVENOUS | Status: AC
  Filled 2011-02-18: qty 200

## 2011-02-18 MED ORDER — POVIDONE-IODINE 10 % OINT PACKET
TOPICAL_OINTMENT | CUTANEOUS | Status: DC | PRN
  Administered 2011-02-18: 2 via TOPICAL

## 2011-02-18 MED ORDER — PHENYLEPHRINE HCL 10 MG/ML IJ SOLN
INTRAMUSCULAR | Status: AC
  Filled 2011-02-18: qty 1

## 2011-02-18 MED ORDER — DOPAMINE-DEXTROSE 3.2-5 MG/ML-% IV SOLN: 5.0000 ug/kg/min | INTRAVENOUS | Status: DC

## 2011-02-18 MED ORDER — FUROSEMIDE 10 MG/ML IJ SOLN
40.0000 mg | Freq: Once | INTRAMUSCULAR | Status: AC
  Administered 2011-02-18: 40 mg via INTRAVENOUS

## 2011-02-18 MED ORDER — ROCURONIUM BROMIDE 50 MG/5ML IV SOLN
INTRAVENOUS | Status: AC
  Filled 2011-02-18: qty 2

## 2011-02-18 MED ORDER — PHENYLEPHRINE HCL 10 MG/ML IJ SOLN
100.0000 ug | Freq: Once | INTRAMUSCULAR | Status: AC
  Administered 2011-02-18: 100 ug via INTRAVENOUS

## 2011-02-18 MED ORDER — SODIUM CHLORIDE 0.9 % IV SOLN: Freq: Once | INTRAVENOUS | Status: DC

## 2011-02-18 MED ORDER — ROCURONIUM BROMIDE 100 MG/10ML IV SOLN
INTRAVENOUS | Status: DC | PRN
  Administered 2011-02-18: 50 mg via INTRAVENOUS
  Administered 2011-02-18: 30 mg via INTRAVENOUS

## 2011-02-18 MED ORDER — MIDAZOLAM HCL 5 MG/5ML IJ SOLN
INTRAMUSCULAR | Status: DC | PRN
  Administered 2011-02-18: 2 mg via INTRAVENOUS

## 2011-02-18 MED ORDER — PHENYLEPHRINE HCL 10 MG/ML IJ SOLN
INTRAMUSCULAR | Status: DC | PRN
  Administered 2011-02-18: 100 ug via INTRAVENOUS

## 2011-02-18 MED ORDER — SUCCINYLCHOLINE CHLORIDE 20 MG/ML IJ SOLN
INTRAMUSCULAR | Status: AC
  Filled 2011-02-18: qty 1

## 2011-02-18 MED ORDER — MIDAZOLAM HCL 2 MG/2ML IJ SOLN
INTRAMUSCULAR | Status: AC
  Filled 2011-02-18: qty 2

## 2011-02-18 MED ORDER — FENTANYL CITRATE 0.05 MG/ML IJ SOLN
INTRAMUSCULAR | Status: DC | PRN
  Administered 2011-02-18 (×2): 50 ug via INTRAVENOUS
  Administered 2011-02-18: 100 ug via INTRAVENOUS
  Administered 2011-02-18: 50 ug via INTRAVENOUS

## 2011-02-18 MED ORDER — MIDAZOLAM HCL 2 MG/2ML IJ SOLN
1.0000 mg | INTRAMUSCULAR | Status: DC | PRN
  Administered 2011-02-18 – 2011-02-20 (×8): 2 mg via INTRAVENOUS
  Filled 2011-02-18 (×8): qty 2

## 2011-02-18 MED ORDER — FUROSEMIDE 10 MG/ML IJ SOLN
INTRAMUSCULAR | Status: AC
  Administered 2011-02-18: 40 mg via INTRAVENOUS
  Filled 2011-02-18: qty 4

## 2011-02-18 MED ORDER — FENTANYL CITRATE 0.05 MG/ML IJ SOLN
25.0000 ug | INTRAMUSCULAR | Status: DC | PRN
  Administered 2011-02-18 – 2011-02-20 (×7): 50 ug via INTRAVENOUS
  Filled 2011-02-18 (×7): qty 2

## 2011-02-18 MED ORDER — ETOMIDATE 2 MG/ML IV SOLN
INTRAVENOUS | Status: AC
  Filled 2011-02-18: qty 20

## 2011-02-18 MED ORDER — SODIUM BICARBONATE 8.4 % IV SOLN
INTRAVENOUS | Status: AC
  Administered 2011-02-18: 50 meq
  Filled 2011-02-18: qty 50

## 2011-02-18 MED ORDER — NOREPINEPHRINE BITARTRATE 1 MG/ML IJ SOLN
INTRAMUSCULAR | Status: AC
Start: 2011-02-18 — End: 2011-02-18
  Filled 2011-02-18: qty 4

## 2011-02-18 MED ORDER — LACTATED RINGERS IV SOLN
INTRAVENOUS | Status: DC | PRN
  Administered 2011-02-18: 13:00:00 via INTRAVENOUS

## 2011-02-18 SURGICAL SUPPLY — 68 items
APPLIER CLIP 11 MED OPEN (CLIP)
APPLIER CLIP 13 LRG OPEN (CLIP)
APR CLP LRG 13 20 CLIP (CLIP)
APR CLP MED 11 20 MLT OPN (CLIP)
BAG HAMPER (MISCELLANEOUS) ×2 IMPLANT
BARRIER SKIN 2 3/4 (OSTOMY) IMPLANT
BRR SKN FLT 2.75X2.25 2 PC (OSTOMY)
CLAMP POUCH DRAINAGE QUIET (OSTOMY) IMPLANT
CLIP APPLIE 11 MED OPEN (CLIP) IMPLANT
CLIP APPLIE 13 LRG OPEN (CLIP) IMPLANT
CLOTH BEACON ORANGE TIMEOUT ST (SAFETY) ×2 IMPLANT
COVER LIGHT HANDLE STERIS (MISCELLANEOUS) ×4 IMPLANT
DRAPE WARM FLUID 44X44 (DRAPE) ×2 IMPLANT
DRSG MEPILEX BORDER 4X12 (GAUZE/BANDAGES/DRESSINGS) ×2 IMPLANT
DURAPREP 26ML APPLICATOR (WOUND CARE) ×2 IMPLANT
ELECT BLADE 6 FLAT ULTRCLN (ELECTRODE) IMPLANT
ELECT REM PT RETURN 9FT ADLT (ELECTROSURGICAL) ×2
ELECTRODE REM PT RTRN 9FT ADLT (ELECTROSURGICAL) ×1 IMPLANT
EVACUATOR DRAINAGE 10X20 100CC (DRAIN) IMPLANT
EVACUATOR SILICONE 100CC (DRAIN) ×2
GLOVE BIO SURGEON STRL SZ7.5 (GLOVE) ×4 IMPLANT
GLOVE ECLIPSE 7.0 STRL STRAW (GLOVE) ×1 IMPLANT
GLOVE ECLIPSE 8.0 STRL XLNG CF (GLOVE) ×1 IMPLANT
GLOVE EXAM NITRILE MD LF STRL (GLOVE) ×1 IMPLANT
GLOVE INDICATOR 7.5 STRL GRN (GLOVE) ×2 IMPLANT
GLOVE INDICATOR 8.5 STRL (GLOVE) ×1 IMPLANT
GOWN BRE IMP SLV AUR XL STRL (GOWN DISPOSABLE) ×5 IMPLANT
HARMONIC SHEARS 14CM COAG (MISCELLANEOUS) IMPLANT
INST SET MAJOR GENERAL (KITS) ×2 IMPLANT
KIT REMOVER STAPLE SKIN (MISCELLANEOUS) IMPLANT
KIT ROOM TURNOVER APOR (KITS) ×2 IMPLANT
LIGASURE IMPACT 36 18CM CVD LR (INSTRUMENTS) IMPLANT
MANIFOLD NEPTUNE II (INSTRUMENTS) ×2 IMPLANT
NS IRRIG 1000ML POUR BTL (IV SOLUTION) ×6 IMPLANT
PACK ABDOMINAL MAJOR (CUSTOM PROCEDURE TRAY) ×2 IMPLANT
PAD ARMBOARD 7.5X6 YLW CONV (MISCELLANEOUS) ×2 IMPLANT
POUCH OSTOMY 2 3/4  H 3804 (WOUND CARE)
POUCH OSTOMY 2 3/4 H 3804 (WOUND CARE)
POUCH OSTOMY 2 PC DRNBL 2.75 (WOUND CARE) IMPLANT
RELOAD LINEAR CUT PROX 55 BLUE (ENDOMECHANICALS) IMPLANT
RELOAD PROXIMATE 75MM BLUE (ENDOMECHANICALS) IMPLANT
RELOAD STAPLE 55 3.8 BLU REG (ENDOMECHANICALS) IMPLANT
RELOAD STAPLE 75 3.8 BLU REG (ENDOMECHANICALS) IMPLANT
RETRACTOR WND ALEXIS 25 LRG (MISCELLANEOUS) ×1 IMPLANT
RETRACTOR WOUND ALXS 34CM XLRG (MISCELLANEOUS) IMPLANT
RTRCTR WOUND ALEXIS 25CM LRG (MISCELLANEOUS) ×2
RTRCTR WOUND ALEXIS 34CM XLRG (MISCELLANEOUS)
SET BASIN LINEN APH (SET/KITS/TRAYS/PACK) ×2 IMPLANT
SHEARS HARMONIC 23CM COAG (MISCELLANEOUS) IMPLANT
SPONGE DRAIN TRACH 4X4 STRL 2S (GAUZE/BANDAGES/DRESSINGS) ×2 IMPLANT
SPONGE GAUZE 4X4 12PLY (GAUZE/BANDAGES/DRESSINGS) ×2 IMPLANT
SPONGE LAP 18X18 X RAY DECT (DISPOSABLE) ×4 IMPLANT
STAPLER GUN LINEAR PROX 60 (STAPLE) IMPLANT
STAPLER PROXIMATE 55 BLUE (STAPLE) IMPLANT
STAPLER PROXIMATE 75MM BLUE (STAPLE) IMPLANT
STAPLER VISISTAT (STAPLE) ×2 IMPLANT
SUCTION POOLE TIP (SUCTIONS) ×2 IMPLANT
SUT CHROMIC 0 SH (SUTURE) ×1 IMPLANT
SUT CHROMIC 2 0 SH (SUTURE) IMPLANT
SUT ETHILON 3 0 FSL (SUTURE) ×1 IMPLANT
SUT NOVA NAB GS-26 0 60 (SUTURE) ×4 IMPLANT
SUT SILK 2 0 (SUTURE)
SUT SILK 2 0 REEL (SUTURE) IMPLANT
SUT SILK 2-0 18XBRD TIE 12 (SUTURE) IMPLANT
SUT SILK 3 0 SH CR/8 (SUTURE) ×4 IMPLANT
TAPE CLOTH SURG 4X10 WHT LF (GAUZE/BANDAGES/DRESSINGS) ×1 IMPLANT
TOWEL BLUE STERILE X RAY DET (MISCELLANEOUS) ×1 IMPLANT
TRAY FOLEY CATH 14FR (SET/KITS/TRAYS/PACK) ×1 IMPLANT

## 2011-02-18 NOTE — Consult Note (Signed)
Consult requested by:Dr Lovell Sheehan Consult requested fo rrespiratory failure:  FAO:ZHYQ is a 65 year old Caucasian male who was admitted with a perforated ulcer. He has had surgery and has been left intubated and on the ventilator. He has been hypoxic and somewhat hypotensive since surgery.  Past Medical History  Diagnosis Date  . Depression      History reviewed. No pertinent family history.   History   Social History  . Marital Status: Married    Spouse Name: N/A    Number of Children: N/A  . Years of Education: N/A   Social History Main Topics  . Smoking status: Never Smoker   . Smokeless tobacco: None  . Alcohol Use: No  . Drug Use: No  . Sexually Active: Not Currently   Other Topics Concern  . None   Social History Narrative  . None     ROS: unobtainable    Objective: Vital signs in last 24 hours: Temp:  [97.5 F (36.4 C)-101.1 F (38.4 C)] 101.1 F (38.4 C) (10/11 1715) Pulse Rate:  [69-141] 116  (10/11 1900) Resp:  [15-36] 24  (10/11 1900) BP: (59-124)/(34-88) 104/42 mmHg (10/11 1900) SpO2:  [78 %-100 %] 100 % (10/11 1900) FiO2 (%):  [50 %-100 %] 50 % (10/11 1930) Weight:  [88.9 kg (195 lb 15.8 oz)] 195 lb 15.8 oz (88.9 kg) (10/11 0500) Weight change:  Last BM Date: 02/17/11  Intake/Output from previous day: 10/10 0701 - 10/11 0700 In: 5620 [I.V.:4520; IV Piggyback:1100] Out: 500 [Urine:500]  PHYSICAL EXAM he is intubated and on a ventilator. his blood pressure ipressors. His HEENT examination is unremarkable. His nose and throat are clear. His neck is supple without masses bruits or JVD. His chest shows some rales bilaterally. His heart is regular without murmur gallop or rub. I did not examine his abdomen since he has just had surger His extremities showed no clubbing cyanosis or edema. His central nervous system examination shows that his intubated and sedated  Lab Results: Basic Metabolic Panel:  Basename 02/18/11 1843 02/18/11 0405  NA 139  139  K 4.4 4.2  CL 111 110  CO2 17* 18*  GLUCOSE 135* 236*  BUN 49* 41*  CREATININE 3.04* 2.07*  CALCIUM 7.0* 7.9*  MG 2.2 2.2  PHOS 5.8* 5.1*   Liver Function Tests:  Basename 02/18/11 1843 02/17/11 2214  AST 20 14  ALT 5 7  ALKPHOS 54 50  BILITOT 0.2* 0.1*  PROT 4.0* 4.1*  ALBUMIN 1.5* 1.9*   No results found for this basename: LIPASE:2,AMYLASE:2 in the last 72 hours No results found for this basename: AMMONIA:2 in the last 72 hours CBC:  Basename 02/18/11 1843 02/18/11 0405 02/17/11 2214  WBC 9.4 11.0* --  NEUTROABS 7.6 -- 9.4*  HGB 11.2* 9.1* --  HCT 34.2* 29.7* --  MCV 91.2 95.2 --  PLT 506* 606* --   Cardiac Enzymes: No results found for this basename: CKTOTAL:3,CKMB:3,CKMBINDEX:3,TROPONINI:3 in the last 72 hours BNP: No results found for this basename: POCBNP:3 in the last 72 hours D-Dimer: No results found for this basename: DDIMER:2 in the last 72 hours CBG: No results found for this basename: GLUCAP:6 in the last 72 hours Hemoglobin A1C: No results found for this basename: HGBA1C in the last 72 hours Fasting Lipid Panel: No results found for this basename: CHOL,HDL,LDLCALC,TRIG,CHOLHDL,LDLDIRECT in the last 72 hours Thyroid Function Tests: No results found for this basename: TSH,T4TOTAL,FREET4,T3FREE,THYROIDAB in the last 72 hours Anemia Panel: No results found for this basename: VITAMINB12,FOLATE,FERRITIN,TIBC,IRON,RETICCTPCT  in the last 72 hours Urine Drug Screen:  Alcohol Level: No results found for this basename: ETH:2 in the last 72 hours Urinalysis:  Misc. Labs:   ABGS:  Basename 02/18/11 1645  PHART 7.322*  PO2ART 85.7  TCO2 13.5  HCO3 15.2*     MICROBIOLOGY: Recent Results (from the past 240 hour(s))  MRSA PCR SCREENING     Status: Normal   Collection Time   02/17/11  7:17 PM      Component Value Range Status Comment   MRSA by PCR NEGATIVE  NEGATIVE  Final   SURGICAL PCR SCREEN     Status: Abnormal   Collection Time    02/18/11  1:59 AM      Component Value Range Status Comment   MRSA, PCR NEGATIVE  NEGATIVE  Final    Staphylococcus aureus POSITIVE (*) NEGATIVE  Final     Studies/Results: Ct Abdomen Pelvis W Contrast  02/17/2011  *RADIOLOGY REPORT*  Clinical Data: Fatigue.  Abdominal pain.  Evaluate for intestinal perforation.  Abdominal pain.  Nausea.  CT ABDOMEN AND PELVIS WITH CONTRAST  Technique:  Multidetector CT imaging of the abdomen and pelvis was performed following the standard protocol during bolus administration of intravenous contrast.  Contrast: OMNIPAQUE IOHEXOL 300 MG/ML IV SOLN  Comparison: Plain films earlier in the day.  Findings: Clear lung bases.  Mild cardiomegaly without pericardial or pleural effusion.  Suspect mild hepatic steatosis.  No focal liver lesion.  Normal spleen.  Wall thickening at the gastric antrum and pylorus.  Free spill of contrast along the left lobe of the liver with extensive free intraperitoneal air.  Former finding on image 21. Contrast spill on image 31 coronal from the antropyloric region. Adjacent gastrocolic ligament nodes measure up to 8 mm on image 31.  No evidence of abscess.  Normal pancreas, gallbladder, biliary tract, adrenal glands.  Lower pole right renal too small to characterize lesion, likely a cyst.  Mild bilateral renal cortical thinning.  Other too small to characterize bilateral renal lesions. No retroperitoneal or retrocrural adenopathy.  Extensive colonic diverticulosis.  Normal terminal ileum and appendix.  Small volume perihepatic and perisplenic fluid and contrast  Normal caliber of small bowel loops.  Fat containing bilateral inguinal hernias. No pelvic adenopathy. Normal urinary bladder.  Mild prostatomegaly, with prominence of the median lobe.  Fluid and extra enteric contrast in the cul-de- sac on image 82.  Small volume. No acute osseous abnormality.  IMPRESSION:  1.  Free intraperitoneal air is secondary to perforation of the antropyloric  region.  Underlying wall thickening could be related to gastritis/duodenitis or ulcer. 2.  Small volume abdominal and pelvic extraenteric fluid/contrast. 3.  Small nodes in the gastrocolic ligament.  Likely reactive. Recommend attention on follow-up.  Original Report Authenticated By: Consuello Bossier, M.D.   Dg Chest Portable 1 View  02/18/2011  *RADIOLOGY REPORT*  Clinical Data: Status post intubation for respiratory failure.  PORTABLE CHEST - 1 VIEW  Comparison: 02/17/2011  Findings: Endotracheal tube present with tip approximately 3 cm above the carina.  Lung volumes are extremely low bilaterally with bibasilar atelectasis present.  No evidence of pneumothorax, edema or pleural effusion.  A nasogastric tube extends into the stomach. Central line is in stable position.  Surgical drain visible in the upper abdomen.  IMPRESSION: Endotracheal tube tip approximately 3 cm above the carina.  Lungs show low volumes with bibasilar atelectasis.  Original Report Authenticated By: Reola Calkins, M.D.   Dg Chest Portable  1 View  02/17/2011  *RADIOLOGY REPORT*  Clinical Data: Line placement  PORTABLE CHEST - 1 VIEW  Comparison: None.  Findings: There is an NG tube with tip in the esophagus.  Left subclavian catheter with tip projecting over the left brachiocephalic vein/SVC confluence.  Lungs are hypoaerated. Bibasilar linear opacities are favored to be scarring or atelectasis.  Cardiomediastinal contours mildly prominent however likely exaggerated by technique/respiratory effort.  No acute osseous abnormality identified.  IMPRESSION: NG tube tip projects over the esophagus.  Recommend advancement.  Subclavian catheter tip projects over the left brachiocephalic vein/SVC confluence.  No pneumothorax.  Original Report Authenticated By: Waneta Martins, M.D.   Dg Abd Acute W/chest  02/17/2011  *RADIOLOGY REPORT*  Clinical Data: Abdominal pain and GI bleed.  ACUTE ABDOMEN SERIES (ABDOMEN 2 VIEW & CHEST 1 VIEW)   Comparison: None.  Findings: Frontal view of the chest shows midline trachea.  Heart size is accentuated by low lung volumes and AP technique.  Linear densities are seen in both lung bases.  Lucencies are seen beneath the hemidiaphragms bilaterally.  Two views of the abdomen show gas and stool in the colon.  No small bowel dilatation.  IMPRESSION:  1.  Pneumoperitoneum. Critical Value/emergent results were called by telephone at the time of interpretation on 02/17/2011  at 1210 hours  to  Dr. Oletta Lamas, who verbally acknowledged these results. 2.  Bibasilar atelectasis and/or scarring.  Original Report Authenticated By: Reyes Ivan, M.D.    Medications:  Scheduled:   . sodium chloride   Intravenous Once  . sodium chloride   Intravenous Once  . acetaminophen  1,000 mg Intravenous Q6H  . Chlorhexidine Gluconate Cloth  6 each Topical Q0600  . DOPamine      . enoxaparin  40 mg Subcutaneous Q24H  . etomidate      . fentaNYL      . furosemide  40 mg Intravenous Once  . lidocaine (cardiac) 100 mg/15ml      . midazolam      . mupirocin   Topical BID  . pantoprazole (PROTONIX) IV  40 mg Intravenous Q24H  . phenylephrine      . phenylephrine  100 mcg Intravenous Once  . piperacillin-tazobactam (ZOSYN)  IV  3.375 g Intravenous Q8H  . propofol      . rocuronium      . rocuronium      . sodium bicarbonate      . sodium chloride  1,000 mL Intravenous Once  . sodium chloride  1,000 mL Intravenous Once  . sodium chloride  10 mL Intracatheter Q12H  . sodium chloride      . succinylcholine      . DISCONTD: sodium bicarbonate  50 mEq Intravenous Once   Continuous:   . DOPamine 10 mcg/kg/min (02/18/11 1943)  . lactated ringers 150 mL/hr at 02/18/11 1944  . norepinephrine (LEVOPHED) Adult infusion 6 mcg/min (02/18/11 1943)  . DISCONTD: dextrose 5 % and 0.9% NaCl Stopped (02/18/11 1817)   ZOX:WRUEAVWU, HYDROmorphone, levalbuterol, midazolam, ondansetron, sodium chloride, DISCONTD: povidone-iodine,  DISCONTD: sodium chloride  Assesment:he has respiratory failure. I think he probably is developing early ARDS I have changed his ventilator settings to ARDS protocol. Active Problems:  * No active hospital problems. *     Plan:he will continue intubated and on a ventilator and I switched his ventilator protocol  Thank you for allowing me to see him with you    LOS: 1 day   Shameek Nyquist L 02/18/2011, 7:40  PM

## 2011-02-18 NOTE — Addendum Note (Signed)
Addendum  created 02/18/11 1547 by Laurene Footman   Modules edited:Anesthesia Attestations

## 2011-02-18 NOTE — Progress Notes (Signed)
A-line was not inserted due to low blood pressures.  Dr. Juanetta Gosling instituted ards protocol on patient.

## 2011-02-18 NOTE — Anesthesia Preprocedure Evaluation (Addendum)
Anesthesia Evaluation   Patient unresponsive  General Assessment Comment  Airway       Dental   Pulmonary  ?? Early ARDS, intubated in ICU. Dopamine for BP support.         Cardiovascular Irregular Tachycardia Echo - nl, hyperdynamic   Neuro/Psych    GI/Hepatic perfed DU, ?early sepsis or ARDS   Endo/Other    Renal/GU      Musculoskeletal   Abdominal (+) obese,  Abdomen: soft. Bowel sounds: absent.  Peds  Hematology   Anesthesia Other Findings   Reproductive/Obstetrics                         Anesthesia Physical Anesthesia Plan  ASA: III  Anesthesia Plan: General   Post-op Pain Management:    Induction: Intravenous  Airway Management Planned: Oral ETT  Additional Equipment:   Intra-op Plan:   Post-operative Plan: Post-operative intubation/ventilation  Informed Consent: I have reviewed the patients History and Physical, chart, labs and discussed the procedure including the risks, benefits and alternatives for the proposed anesthesia with the patient or authorized representative who has indicated his/her understanding and acceptance.     Plan Discussed with: CRNA  Anesthesia Plan Comments: (Post op vent support.)        Anesthesia Quick Evaluation

## 2011-02-18 NOTE — Procedures (Signed)
Arterial Catheter Insertion Procedure Note JERMAINE NEUHARTH [id number] 08/18/1945  Procedure: Insertion of Arterial Catheter  Indications: Blood pressure monitoring  Procedure Details Consent: Risks of procedure as well as the alternatives and risks of each were explained to the (patient/caregiver).  Consent for procedure obtained. Time Out: Verified patient identification, verified procedure, site/side was marked, verified correct patient position, special equipment/implants available, medications/allergies/relevent history reviewed, required imaging and test results available.  Performed  Maximum sterile technique was used including antiseptics, gloves, gown, hand hygiene and sheet. Skin prep: Chlorhexidine; local anesthetic administered 22 gauge catheter was inserted into right femoral artery using the Seldinger technique.  Evaluation Blood flow good; BP tracing good. Complications: No apparent complications.   Franky Macho A 02/18/2011

## 2011-02-18 NOTE — Progress Notes (Signed)
eLink Physician-Brief Progress Note Patient Name: Jason Holt DOB: 1945-12-25 MRN: [id number]  Date of Service  02/18/2011   HPI/Events of Note   Acidotic, now intubated. Worsening creat and also repeated hypotension. S/p lap. Camera rounds  eICU Interventions  Fluid bolus Check cvp Check coox Check labs   Intervention Category Major Interventions: Shock - evaluation and management  Dorthie Santini 02/18/2011, 6:33 PM

## 2011-02-18 NOTE — Transfer of Care (Signed)
Immediate Anesthesia Transfer of Care Note  Patient: Jason Holt  Procedure(s) Performed:  EXPLORATORY LAPAROTOMY Mike Gip  Patient Location: ICU  Anesthesia Type: General  Level of Consciousness: unresponsive  Airway & Oxygen Therapy: Patient remains intubated per anesthesia plan  Post-op Assessment: Post -op Vital signs reviewed and stable  Post vital signs: Reviewed and stable  Complications: No apparent anesthesia complications

## 2011-02-18 NOTE — Progress Notes (Signed)
HYPOTENSION and TACHYCARDIA  Noted.  Spoke with bedside nurse.  Bedside RN to notify attending MD.  eMD notified.  Will continue to monitor.

## 2011-02-18 NOTE — OR Nursing (Signed)
Received patient to OR intubated via R. Ervin Knack, CRNA, Henderson Baltimore, RN; Post op patient transported with monitoring to ICU 7, remained intubated, transported by R. Idacavage, CRNA, C.Page, RN, Henderson Baltimore, RN.

## 2011-02-18 NOTE — Progress Notes (Signed)
eLink Physician-Brief Progress Note Patient Name: Jason Holt DOB: March 11, 1946 MRN: [id number]  Date of Service  02/18/2011   HPI/Events of Note  Patient developing MODS - anuric, fio2 50%, continued pressor need. RN says CVP 10   eICU Interventions  2 more liter fluid bolus, recheck abg and bmet and if worse transfer to [facility]  D/w Primary attending Dr. Lovell Sheehan   Intervention Category Major Interventions: Shock - evaluation and management  Ayan Heffington 02/18/2011, 9:42 PM

## 2011-02-18 NOTE — Progress Notes (Signed)
Intubated per anesthesia size 8 endotube 24cm at the lip. Placed on PRVC, f15, VT 500, FIO2 100%, NO PEEP. Equal and bilateral breath sounds.

## 2011-02-18 NOTE — Progress Notes (Signed)
Pt resting comfortably at present, with no acute distress noted.  Dopamine started due to low bp. Awaiting for surgical team to come and get patient for surgery by Dr. Lovell Sheehan.  Family at the bedside after intubation.  Emotional support provided.  HR 127, bp 78/39 and titrating at present.  Ngt removed during intubation, new one to be placed in or.  Art line to be placed in OR as well.

## 2011-02-18 NOTE — Progress Notes (Signed)
UR Chart Review Completed  

## 2011-02-18 NOTE — Progress Notes (Signed)
Dr. Lovell Sheehan notified of ABG results, ph 7.06, co2 48.7, po2 62, sat 79.8, bicarb 13.2. Orders received. Dr. Lovell Sheehan on way to intubate patient before surgery.

## 2011-02-18 NOTE — Progress Notes (Signed)
*  PRELIMINARY RESULTS* Echocardiogram 2D Echocardiogram has been performed.  Jason Holt 02/18/2011, 10:21 AM

## 2011-02-18 NOTE — Anesthesia Procedure Notes (Addendum)
Procedure Name: Intubation Date/Time: 02/18/2011 11:45 AM Performed by: Marylene Buerger Pre-anesthesia Checklist: Emergency Drugs available, Patient identified and Patient being monitored Patient Re-evaluated:Patient Re-evaluated prior to inductionPreoxygenation: Pre-oxygenation with 100% oxygen Intubation Type: IV induction, Rapid sequence and Circoid Pressure applied Number of attempts: 1 Airway Equipment and Method: stylet and lighted stylet Placement Confirmation: ETT inserted through vocal cords under direct vision,  positive ETCO2,  breath sounds checked- equal and bilateral and CO2 detector Secured at: 21 cm Dental Injury: Teeth and Oropharynx as per pre-operative assessment  Difficulty Due To: Difficulty was unanticipated Comments: Emergent Intubation In ICU #7. Amidate 8 Milligrams Anectine 120 miligrams Intubated X1 Zemuron 40 Milligrams

## 2011-02-18 NOTE — Anesthesia Postprocedure Evaluation (Signed)
  Anesthesia Post-op Note  Patient: Jason Holt  Procedure(s) Performed:  EXPLORATORY LAPAROTOMY - Gastrorraphy  Patient Location: ICU  Anesthesia Type: General  Level of Consciousness: unresponsive and Patient remains intubated per anesthesia plan  Airway and Oxygen Therapy: Patient remains intubated per anesthesia plan and Patient placed on Ventilator (see vital sign flow sheet for setting)  Post-op Pain: none  Post-op Assessment: Post-op Vital signs reviewed, Patient's Cardiovascular Status Stable and Respiratory Function Stable  Post-op Vital Signs: Reviewed and stable  Complications: No apparent anesthesia complications

## 2011-02-18 NOTE — Op Note (Signed)
Patient:  Jason Holt  DOB:  Jan 14, 1946  MRN:  [id number]   Preop Diagnosis:  Perforated viscus  Postop Diagnosis:  Same, perforated duodenal ulcer  Procedure:  Exploratory laparotomy, gastrorrhaphy  Surgeon:  Franky Macho, M.D.  Anes:  General endotracheal  Indications:  Patient is a 65 year old white male who presented yesterday afternoon with worsening abdominal pain. CT scan the abdomen and pelvis revealed pneumoperitoneum with thickened duodenal wall, consistent with probable perforated duodenum. He had had symptoms for approximately 3 or 4 weeks with episodes of syncope at home. It was elected to admit him to the hospital for stabilization prior to surgery. Do to aggressive hydration, he started having some shortness of breath do to possible early pulmonary edema. He was alert intubated in the intensive care unit and then brought down for his scheduled surgery. The risks and benefits of the procedure including bleeding, infection, cardiopulmonary difficulties, the possibly of remaining on the ventilator for a prolonged time after the surgery were fully explained to the family, gave informed consent for the patient. The patient previously signed the consent form for the surgery.  Procedure note:  Patient was placed the supine position. The abdomen was prepped and draped using usual sterile technique with DuraPrep. Surgical site confirmation was performed.  An upper midline incision was made down to the fascia.  The peritoneal cavity was entered into without difficulty. Cloudy peritoneal fluid was found. Approximately 2 L of fluid was found. This was evacuated without difficulty. The duodenum was explored and the superior portion of the first portion the duodenum had a relatively large perforation present. This was closed primarily using 3-0 silk sutures and surrounding adipose tissue was then closed over this and secured to the repair using 3-0 silk sutures. A nasogastric tube is noted be  in appropriate position in the stomach. The abdominal cavity was then copiously irrigated with warm normal saline until the fluid was clear. No other lesions were noted, though the exploration was limited secondary to the critical nature of the patient's cardiovascular status. A #10 flat Jackson-Pratt drain was placed around the repair and brought through separate stab wound to the right of the incision. It was secured at the skin level using 3-0 nylon interrupted suture. The fascia was reapproximated using looped oh Novafil running suture. Subcutaneous layer was irrigated normal saline and the skin was closed using staples. Betadine ointment after dressings were applied.  All tape and needle counts were correct at the end of the procedure. Patient was left intubated and transferred to the intensive care unit in critical condition.  Complications:  None  EBL:  25 cc  Specimen:  None  Drains: Jackson-Pratt drain to gastrorrhaphy repair  Transfusions: 2 units packed red blood cells

## 2011-02-18 NOTE — Progress Notes (Signed)
Notified Dr. Lovell Sheehan of tachycardia 138 and bp 76/44 with Dopamine drip at 83mcg/kg/min.  Also notified him of pt's fever 101.1.  New order received to start Levophed drip.

## 2011-02-18 NOTE — Progress Notes (Signed)
CRITICAL VALUE ALERT  Critical value received:  Positive Staph aureus presurgical swab  Date of notification:  02/18/11  Time of notification:  0354  Critical value read back:yes  Nurse who received alert:  Jinny Sanders, RN  MD notified (1st page):    Time of first page:    MD notified (2nd page):  Time of second page:  Responding MD:    Time MD responded:    MD not notified per protocol orders were initiated.

## 2011-02-19 ENCOUNTER — Inpatient Hospital Stay (HOSPITAL_COMMUNITY): Payer: Non-veteran care

## 2011-02-19 LAB — CBC
HCT: 30.6 % — ABNORMAL LOW (ref 39.0–52.0)
Platelets: 520 10*3/uL — ABNORMAL HIGH (ref 150–400)
RBC: 3.38 MIL/uL — ABNORMAL LOW (ref 4.22–5.81)
RDW: 16 % — ABNORMAL HIGH (ref 11.5–15.5)
WBC: 10.7 10*3/uL — ABNORMAL HIGH (ref 4.0–10.5)

## 2011-02-19 LAB — BLOOD GAS, ARTERIAL
Acid-base deficit: 9.4 mmol/L — ABNORMAL HIGH (ref 0.0–2.0)
Bicarbonate: 13.7 mEq/L — ABNORMAL LOW (ref 20.0–24.0)
FIO2: 30 %
O2 Saturation: 95.7 %
O2 Saturation: 97.2 %
PEEP: 5 cmH2O
RATE: 23 resp/min
TCO2: 13.7 mmol/L (ref 0–100)
pCO2 arterial: 27.2 mmHg — ABNORMAL LOW (ref 35.0–45.0)
pH, Arterial: 7.35 (ref 7.350–7.450)
pO2, Arterial: 78 mmHg — ABNORMAL LOW (ref 80.0–100.0)
pO2, Arterial: 90.2 mmHg (ref 80.0–100.0)

## 2011-02-19 LAB — BASIC METABOLIC PANEL
CO2: 14 mEq/L — ABNORMAL LOW (ref 19–32)
Calcium: 6.6 mg/dL — ABNORMAL LOW (ref 8.4–10.5)
Calcium: 6.3 mg/dL — CL (ref 8.4–10.5)
Chloride: 111 mEq/L (ref 96–112)
Creatinine, Ser: 3.31 mg/dL — ABNORMAL HIGH (ref 0.50–1.35)
Creatinine, Ser: 3.88 mg/dL — ABNORMAL HIGH (ref 0.50–1.35)
GFR calc Af Amer: 21 mL/min — ABNORMAL LOW (ref >90)
GFR calc Af Amer: 17 mL/min — ABNORMAL LOW (ref >90)
Sodium: 141 mEq/L (ref 135–145)
Sodium: 138 mEq/L (ref 135–145)

## 2011-02-19 LAB — PHOSPHORUS: Phosphorus: 5.4 mg/dL — ABNORMAL HIGH (ref 2.3–4.6)

## 2011-02-19 MED ORDER — FUROSEMIDE 10 MG/ML IJ SOLN
200.0000 mg | Freq: Two times a day (BID) | INTRAVENOUS | Status: DC
  Administered 2011-02-19 – 2011-02-20 (×2): 200 mg via INTRAVENOUS
  Filled 2011-02-19 (×7): qty 20

## 2011-02-19 MED ORDER — FLUCONAZOLE IN SODIUM CHLORIDE 200-0.9 MG/100ML-% IV SOLN
INTRAVENOUS | Status: AC
  Filled 2011-02-19: qty 200

## 2011-02-19 MED ORDER — PIPERACILLIN-TAZOBACTAM IN DEX 2-0.25 GM/50ML IV SOLN
2.2500 g | Freq: Three times a day (TID) | INTRAVENOUS | Status: DC
  Administered 2011-02-19 – 2011-02-20 (×3): 2.25 g via INTRAVENOUS
  Filled 2011-02-19 (×10): qty 50

## 2011-02-19 MED ORDER — FLUCONAZOLE IN SODIUM CHLORIDE 400-0.9 MG/200ML-% IV SOLN
800.0000 mg | INTRAVENOUS | Status: DC
  Filled 2011-02-19: qty 400

## 2011-02-19 MED ORDER — SODIUM CHLORIDE 0.45 % IV SOLN: INTRAVENOUS | Status: DC

## 2011-02-19 MED ORDER — VANCOMYCIN HCL IN DEXTROSE 1-5 GM/200ML-% IV SOLN
1000.0000 mg | INTRAVENOUS | Status: DC
  Administered 2011-02-20: 1000 mg via INTRAVENOUS
  Filled 2011-02-19 (×3): qty 200

## 2011-02-19 MED ORDER — FUROSEMIDE 10 MG/ML IJ SOLN
200.0000 mg | Freq: Once | INTRAVENOUS | Status: AC
  Administered 2011-02-19: 200 mg via INTRAVENOUS
  Filled 2011-02-19: qty 20

## 2011-02-19 MED ORDER — CHLORHEXIDINE GLUCONATE 0.12 % MT SOLN
15.0000 mL | Freq: Two times a day (BID) | OROMUCOSAL | Status: DC
  Administered 2011-02-19 – 2011-02-20 (×2): 15 mL via OROMUCOSAL
  Filled 2011-02-19 (×3): qty 15

## 2011-02-19 MED ORDER — SODIUM CHLORIDE 0.45 % IV SOLN
INTRAVENOUS | Status: DC
  Administered 2011-02-19 – 2011-02-20 (×4): via INTRAVENOUS
  Filled 2011-02-19 (×13): qty 1000

## 2011-02-19 MED ORDER — ALBUMIN HUMAN 25 % IV SOLN
25.0000 g | Freq: Two times a day (BID) | INTRAVENOUS | Status: DC
  Administered 2011-02-19 (×2): 25 g via INTRAVENOUS
  Filled 2011-02-19 (×4): qty 100

## 2011-02-19 MED ORDER — SODIUM BICARBONATE 8.4 % IV SOLN
INTRAVENOUS | Status: AC
  Filled 2011-02-19: qty 100

## 2011-02-19 MED ORDER — NOREPINEPHRINE BITARTRATE 1 MG/ML IJ SOLN
2.0000 ug/min | INTRAVENOUS | Status: DC
  Administered 2011-02-19: 16 ug/min via INTRAVENOUS
  Administered 2011-02-19: 14 ug/min via INTRAVENOUS
  Administered 2011-02-19: 20 ug/min via INTRAVENOUS
  Administered 2011-02-20: 10 ug/min via INTRAVENOUS
  Filled 2011-02-19 (×3): qty 8

## 2011-02-19 MED ORDER — BIOTENE DRY MOUTH MT LIQD
Freq: Four times a day (QID) | OROMUCOSAL | Status: DC
  Administered 2011-02-19 – 2011-02-20 (×4): via OROMUCOSAL

## 2011-02-19 MED ORDER — NOREPINEPHRINE BITARTRATE 1 MG/ML IJ SOLN
INTRAMUSCULAR | Status: AC
  Filled 2011-02-19: qty 8

## 2011-02-19 MED ORDER — ENOXAPARIN SODIUM 30 MG/0.3ML ~~LOC~~ SOLN
30.0000 mg | SUBCUTANEOUS | Status: DC
  Administered 2011-02-19: 30 mg via SUBCUTANEOUS
  Filled 2011-02-19: qty 0.3

## 2011-02-19 MED ORDER — VANCOMYCIN HCL IN DEXTROSE 1-5 GM/200ML-% IV SOLN
1000.0000 mg | Freq: Two times a day (BID) | INTRAVENOUS | Status: DC
  Administered 2011-02-19: 1000 mg via INTRAVENOUS
  Filled 2011-02-19: qty 200

## 2011-02-19 MED ORDER — FLUCONAZOLE IN SODIUM CHLORIDE 200-0.9 MG/100ML-% IV SOLN
200.0000 mg | INTRAVENOUS | Status: DC
  Filled 2011-02-19 (×3): qty 100

## 2011-02-19 MED ORDER — FLUCONAZOLE IN SODIUM CHLORIDE 400-0.9 MG/200ML-% IV SOLN
400.0000 mg | Freq: Once | INTRAVENOUS | Status: AC
  Administered 2011-02-19: 400 mg via INTRAVENOUS
  Filled 2011-02-19: qty 200

## 2011-02-19 MED ORDER — VANCOMYCIN HCL IN DEXTROSE 1-5 GM/200ML-% IV SOLN
INTRAVENOUS | Status: AC
  Filled 2011-02-19: qty 200

## 2011-02-19 MED ORDER — ACETAMINOPHEN 10 MG/ML IV SOLN
INTRAVENOUS | Status: AC
  Filled 2011-02-19: qty 100

## 2011-02-19 NOTE — Consult Note (Signed)
Reason for Consult: Acute kidney injury Referring Physician: Dr. Darci Current JAMEEK BRUNTZ is an 65 y.o. male.  HPI: Patient without a significant past medical history presently had came to the emergency room with complaints of abdominal pain and also syncope. During his workup patient was found to have pneumohemothorax possibility of stoma came dentist final ulcer perforation was entertained and the surgical consult was called. He is status post her surgery. Patient with septic shock and was on dopamine which seems to be weaned off . Presently patient is still intubated borderline low blood pressure and the oliguric her with worsening of renal failure.  Past Medical History  Diagnosis Date  . Depression     History reviewed. No pertinent past surgical history.  History reviewed. No pertinent family history.  Social History:  reports that he has never smoked. He does not have any smokeless tobacco history on file. He reports that he does not drink alcohol or use illicit drugs.  Allergies:  Allergies  Allergen Reactions  . Eggs Or Egg-Derived Products     Medications: I have reviewed the patient's current medications.  Results for orders placed during the hospital encounter of 02/17/11 (from the past 48 hour(s))  OCCULT BLOOD, POC DEVICE     Status: Normal   Collection Time   02/17/11 10:46 AM      Component Value Range Comment   Fecal Occult Bld POSITIVE     URINALYSIS, ROUTINE W REFLEX MICROSCOPIC     Status: Abnormal   Collection Time   02/17/11 10:48 AM      Component Value Range Comment   Color, Urine YELLOW  YELLOW     Appearance CLEAR  CLEAR     Specific Gravity, Urine 1.025  1.005 - 1.030     pH 5.0  5.0 - 8.0     Glucose, UA NEGATIVE  NEGATIVE (mg/dL)    Hgb urine dipstick NEGATIVE  NEGATIVE     Bilirubin Urine SMALL (*) NEGATIVE     Ketones, ur NEGATIVE  NEGATIVE (mg/dL)    Protein, ur NEGATIVE  NEGATIVE (mg/dL)    Urobilinogen, UA 0.2  0.0 - 1.0 (mg/dL)    Nitrite  NEGATIVE  NEGATIVE     Leukocytes, UA NEGATIVE  NEGATIVE  MICROSCOPIC NOT DONE ON URINES WITH NEGATIVE PROTEIN, BLOOD, LEUKOCYTES, NITRITE, OR GLUCOSE <1000 mg/dL.  MRSA PCR SCREENING     Status: Normal   Collection Time   02/17/11  7:17 PM      Component Value Range Comment   MRSA by PCR NEGATIVE  NEGATIVE    BLOOD GAS, ARTERIAL     Status: Abnormal   Collection Time   02/17/11 10:00 PM      Component Value Range Comment   O2 Content 2.0      Delivery systems NASAL CANNULA      pH, Arterial 7.238 (*) 7.350 - 7.450     pCO2 arterial 36.3  35.0 - 45.0 (mmHg)    pO2, Arterial 74.1 (*) 80.0 - 100.0 (mmHg)    Bicarbonate 14.9 (*) 20.0 - 24.0 (mEq/L)    TCO2 14.4  0 - 100 (mmol/L)    Acid-base deficit 11.1 (*) 0.0 - 2.0 (mmol/L)    O2 Saturation 91.4      Patient temperature 37.0      Collection site RIGHT RADIAL      Drawn by [id number]      Sample type ARTERIAL      Allens test (pass/fail) PASS  PASS    CBC     Status: Abnormal   Collection Time   02/17/11 10:14 PM      Component Value Range Comment   WBC 10.8 (*) 4.0 - 10.5 (K/uL)    RBC 3.18 (*) 4.22 - 5.81 (MIL/uL)    Hemoglobin 9.3 (*) 13.0 - 17.0 (g/dL)    HCT 16.1 (*) 09.6 - 52.0 (%)    MCV 94.7  78.0 - 100.0 (fL)    MCH 29.2  26.0 - 34.0 (pg)    MCHC 30.9  30.0 - 36.0 (g/dL)    RDW 04.5  40.9 - 81.1 (%)    Platelets 564 (*) 150 - 400 (K/uL)   DIFFERENTIAL     Status: Abnormal   Collection Time   02/17/11 10:14 PM      Component Value Range Comment   Neutrophils Relative 87 (*) 43 - 77 (%)    Lymphocytes Relative 8 (*) 12 - 46 (%)    Monocytes Relative 5  3 - 12 (%)    Eosinophils Relative 0  0 - 5 (%)    Basophils Relative 0  0 - 1 (%)    Neutro Abs 9.4 (*) 1.7 - 7.7 (K/uL)    Lymphs Abs 0.9  0.7 - 4.0 (K/uL)    Monocytes Absolute 0.5  0.1 - 1.0 (K/uL)    Eosinophils Absolute 0.0  0.0 - 0.7 (K/uL)    Basophils Absolute 0.0  0.0 - 0.1 (K/uL)   LACTATE DEHYDROGENASE     Status: Normal   Collection Time   02/17/11  10:14 PM      Component Value Range Comment   LD 213  94 - 250 (U/L)   PROCALCITONIN     Status: Normal   Collection Time   02/17/11 10:14 PM      Component Value Range Comment   Procalcitonin 32.74     HEPATIC FUNCTION PANEL     Status: Abnormal   Collection Time   02/17/11 10:14 PM      Component Value Range Comment   Total Protein 4.1 (*) 6.0 - 8.3 (g/dL)    Albumin 1.9 (*) 3.5 - 5.2 (g/dL)    AST 14  0 - 37 (U/L)    ALT 7  0 - 53 (U/L)    Alkaline Phosphatase 50  39 - 117 (U/L)    Total Bilirubin 0.1 (*) 0.3 - 1.2 (mg/dL)    Bilirubin, Direct 0.1  0.0 - 0.3 (mg/dL)    Indirect Bilirubin 0.0 (*) 0.3 - 0.9 (mg/dL)   PROTIME-INR     Status: Abnormal   Collection Time   02/17/11 10:14 PM      Component Value Range Comment   Prothrombin Time 17.0 (*) 11.6 - 15.2 (seconds)    INR 1.36  0.00 - 1.49    BASIC METABOLIC PANEL     Status: Abnormal   Collection Time   02/17/11 11:02 PM      Component Value Range Comment   Sodium 135  135 - 145 (mEq/L)    Potassium 4.1  3.5 - 5.1 (mEq/L)    Chloride 103  96 - 112 (mEq/L)    CO2 16 (*) 19 - 32 (mEq/L)    Glucose, Bld 187 (*) 70 - 99 (mg/dL)    BUN 40 (*) 6 - 23 (mg/dL)    Creatinine, Ser 9.14 (*) 0.50 - 1.35 (mg/dL)    Calcium 8.0 (*) 8.4 - 10.5 (mg/dL)    GFR calc  non Af Amer 29 (*) >90 (mL/min)    GFR calc Af Amer 34 (*) >90 (mL/min)   SURGICAL PCR SCREEN     Status: Abnormal   Collection Time   02/18/11  1:59 AM      Component Value Range Comment   MRSA, PCR NEGATIVE  NEGATIVE     Staphylococcus aureus POSITIVE (*) NEGATIVE    BASIC METABOLIC PANEL     Status: Abnormal   Collection Time   02/18/11  4:05 AM      Component Value Range Comment   Sodium 139  135 - 145 (mEq/L)    Potassium 4.2  3.5 - 5.1 (mEq/L)    Chloride 110  96 - 112 (mEq/L)    CO2 18 (*) 19 - 32 (mEq/L)    Glucose, Bld 236 (*) 70 - 99 (mg/dL)    BUN 41 (*) 6 - 23 (mg/dL)    Creatinine, Ser 1.61 (*) 0.50 - 1.35 (mg/dL)    Calcium 7.9 (*) 8.4 - 10.5  (mg/dL)    GFR calc non Af Amer 32 (*) >90 (mL/min)    GFR calc Af Amer 37 (*) >90 (mL/min)   MAGNESIUM     Status: Normal   Collection Time   02/18/11  4:05 AM      Component Value Range Comment   Magnesium 2.2  1.5 - 2.5 (mg/dL)   PHOSPHORUS     Status: Abnormal   Collection Time   02/18/11  4:05 AM      Component Value Range Comment   Phosphorus 5.1 (*) 2.3 - 4.6 (mg/dL)   CBC     Status: Abnormal   Collection Time   02/18/11  4:05 AM      Component Value Range Comment   WBC 11.0 (*) 4.0 - 10.5 (K/uL)    RBC 3.12 (*) 4.22 - 5.81 (MIL/uL)    Hemoglobin 9.1 (*) 13.0 - 17.0 (g/dL)    HCT 09.6 (*) 04.5 - 52.0 (%)    MCV 95.2  78.0 - 100.0 (fL)    MCH 29.2  26.0 - 34.0 (pg)    MCHC 30.6  30.0 - 36.0 (g/dL)    RDW 40.9  81.1 - 91.4 (%)    Platelets 606 (*) 150 - 400 (K/uL)   BLOOD GAS, ARTERIAL     Status: Abnormal   Collection Time   02/18/11 11:05 AM      Component Value Range Comment   O2 Content 3.0      Delivery systems NASAL CANNULA      pH, Arterial 7.062 (*) 7.350 - 7.450     pCO2 arterial 48.7 (*) 35.0 - 45.0 (mmHg)    pO2, Arterial 62.0 (*) 80.0 - 100.0 (mmHg)    Bicarbonate 13.2 (*) 20.0 - 24.0 (mEq/L)    TCO2 13.4  0 - 100 (mmol/L)    Acid-base deficit 15.1 (*) 0.0 - 2.0 (mmol/L)    O2 Saturation 79.8      Patient temperature 37.0      Collection site RIGHT BRACHIAL      Drawn by COLLECTED BY RT      Sample type ARTERIAL      Allens test (pass/fail) PASS  PASS    BLOOD GAS, ARTERIAL     Status: Abnormal   Collection Time   02/18/11  4:45 PM      Component Value Range Comment   FIO2 1.00      Delivery systems VENTILATOR  Mode PRESSURE REGULATED VOLUME CONTROL      VT 500      Rate 15      pH, Arterial 7.322 (*) 7.350 - 7.450     pCO2 arterial 30.3 (*) 35.0 - 45.0 (mmHg)    pO2, Arterial 85.7  80.0 - 100.0 (mmHg)    Bicarbonate 15.2 (*) 20.0 - 24.0 (mEq/L)    TCO2 13.5  0 - 100 (mmol/L)    Acid-base deficit 9.6 (*) 0.0 - 2.0 (mmol/L)    O2  Saturation 96.6      Patient temperature 37.0      Collection site RIGHT BRACHIAL      Drawn by [id number]      Sample type ARTERIAL      Allens test (pass/fail) PASS  PASS    LACTIC ACID, PLASMA     Status: Abnormal   Collection Time   02/18/11  6:43 PM      Component Value Range Comment   Lactic Acid, Venous 2.5 (*) 0.5 - 2.2 (mmol/L)   BASIC METABOLIC PANEL     Status: Abnormal   Collection Time   02/18/11  6:43 PM      Component Value Range Comment   Sodium 139  135 - 145 (mEq/L)    Potassium 4.4  3.5 - 5.1 (mEq/L)    Chloride 111  96 - 112 (mEq/L)    CO2 17 (*) 19 - 32 (mEq/L)    Glucose, Bld 135 (*) 70 - 99 (mg/dL)    BUN 49 (*) 6 - 23 (mg/dL)    Creatinine, Ser 6.57 (*) 0.50 - 1.35 (mg/dL)    Calcium 7.0 (*) 8.4 - 10.5 (mg/dL)    GFR calc non Af Amer 20 (*) >90 (mL/min)    GFR calc Af Amer 23 (*) >90 (mL/min)   PHOSPHORUS     Status: Abnormal   Collection Time   02/18/11  6:43 PM      Component Value Range Comment   Phosphorus 5.8 (*) 2.3 - 4.6 (mg/dL)   MAGNESIUM     Status: Normal   Collection Time   02/18/11  6:43 PM      Component Value Range Comment   Magnesium 2.2  1.5 - 2.5 (mg/dL)   HEPATIC FUNCTION PANEL     Status: Abnormal   Collection Time   02/18/11  6:43 PM      Component Value Range Comment   Total Protein 4.0 (*) 6.0 - 8.3 (g/dL)    Albumin 1.5 (*) 3.5 - 5.2 (g/dL)    AST 20  0 - 37 (U/L)    ALT 5  0 - 53 (U/L)    Alkaline Phosphatase 54  39 - 117 (U/L)    Total Bilirubin 0.2 (*) 0.3 - 1.2 (mg/dL)    Bilirubin, Direct 0.2  0.0 - 0.3 (mg/dL)    Indirect Bilirubin 0.0 (*) 0.3 - 0.9 (mg/dL)   CBC     Status: Abnormal   Collection Time   02/18/11  6:43 PM      Component Value Range Comment   WBC 9.4  4.0 - 10.5 (K/uL)    RBC 3.75 (*) 4.22 - 5.81 (MIL/uL)    Hemoglobin 11.2 (*) 13.0 - 17.0 (g/dL)    HCT 84.6 (*) 96.2 - 52.0 (%)    MCV 91.2  78.0 - 100.0 (fL)    MCH 29.9  26.0 - 34.0 (pg)    MCHC 32.7  30.0 - 36.0 (g/dL)  RDW 15.3  11.5 - 15.5  (%)    Platelets 506 (*) 150 - 400 (K/uL)   DIFFERENTIAL     Status: Abnormal   Collection Time   02/18/11  6:43 PM      Component Value Range Comment   Neutrophils Relative 80 (*) 43 - 77 (%)    Lymphocytes Relative 11 (*) 12 - 46 (%)    Monocytes Relative 9  3 - 12 (%)    Eosinophils Relative 0  0 - 5 (%)    Basophils Relative 0  0 - 1 (%)    Neutro Abs 7.6  1.7 - 7.7 (K/uL)    Lymphs Abs 1.0  0.7 - 4.0 (K/uL)    Monocytes Absolute 0.8  0.1 - 1.0 (K/uL)    Eosinophils Absolute 0.0  0.0 - 0.7 (K/uL)    Basophils Absolute 0.0  0.0 - 0.1 (K/uL)    RBC Morphology POLYCHROMASIA PRESENT      WBC Morphology ATYPICAL LYMPHOCYTES      Smear Review PLATELET COUNT CONFIRMED BY SMEAR     CARBOXYHEMOGLOBIN     Status: Abnormal   Collection Time   02/18/11  8:20 PM      Component Value Range Comment   Total hemoglobin 11.1 (*) 13.5 - 18.0 (g/dL)    O2 Saturation 04.5      Carboxyhemoglobin 1.3  0.5 - 1.5 (%)    Methemoglobin 1.6 (*) 0.0 - 1.5 (%)    Total oxygen content 10.1 (*) 15.0 - 23.0 (mL/dL)   BLOOD GAS, ARTERIAL     Status: Abnormal   Collection Time   02/18/11  8:30 PM      Component Value Range Comment   FIO2 50.00      Delivery systems VENTILATOR      Mode PRESSURE REGULATED VOLUME CONTROL      VT 620      Rate 18      Peep/cpap 8.0      pH, Arterial 7.349 (*) 7.350 - 7.450     pCO2 arterial 27.3 (*) 35.0 - 45.0 (mmHg)    pO2, Arterial 78.6 (*) 80.0 - 100.0 (mmHg)    Bicarbonate 14.7 (*) 20.0 - 24.0 (mEq/L)    TCO2 13.5  0 - 100 (mmol/L)    Acid-base deficit 9.8 (*) 0.0 - 2.0 (mmol/L)    O2 Saturation 95.9      Patient temperature 37.0      Collection site FEMORAL ARTERY      Drawn by [id number]      Sample type ARTERIAL      Allens test (pass/fail) NOT INDICATED (*) PASS    BLOOD GAS, ARTERIAL     Status: Abnormal   Collection Time   02/18/11  9:35 PM      Component Value Range Comment   FIO2 50.00      Delivery systems VENTILATOR      Mode PRESSURE REGULATED VOLUME  CONTROL      VT 520      Rate 21      Peep/cpap 8.0      pH, Arterial 7.351  7.350 - 7.450     pCO2 arterial 27.4 (*) 35.0 - 45.0 (mmHg)    pO2, Arterial 91.4  80.0 - 100.0 (mmHg)    Bicarbonate 14.8 (*) 20.0 - 24.0 (mEq/L)    TCO2 13.4  0 - 100 (mmol/L)    Acid-base deficit 9.7 (*) 0.0 - 2.0 (mmol/L)    O2 Saturation 97.6  Patient temperature 37.0      Collection site FEMORAL ARTERY      Drawn by [id number]      Sample type ARTERIAL      Allens test (pass/fail) NOT INDICATED (*) PASS    BLOOD GAS, ARTERIAL     Status: Abnormal   Collection Time   02/18/11 10:55 PM      Component Value Range Comment   FIO2 40.00      Delivery systems VENTILATOR      Mode PRESSURE REGULATED VOLUME CONTROL      VT 470      Rate 23      Peep/cpap 5.0      pH, Arterial 7.312 (*) 7.350 - 7.450     pCO2 arterial 28.3 (*) 35.0 - 45.0 (mmHg)    pO2, Arterial 90.4  80.0 - 100.0 (mmHg)    Bicarbonate 13.9 (*) 20.0 - 24.0 (mEq/L)    TCO2 12.7  0 - 100 (mmol/L)    Acid-base deficit 11.1 (*) 0.0 - 2.0 (mmol/L)    O2 Saturation 97.0      Patient temperature 37.0      Collection site FEMORAL ARTERY      Drawn by [id number]      Sample type ARTERIAL      Allens test (pass/fail) NOT INDICATED (*) PASS    BASIC METABOLIC PANEL     Status: Abnormal   Collection Time   02/18/11 11:18 PM      Component Value Range Comment   Sodium 141  135 - 145 (mEq/L)    Potassium 4.5  3.5 - 5.1 (mEq/L)    Chloride 116 (*) 96 - 112 (mEq/L)    CO2 14 (*) 19 - 32 (mEq/L)    Glucose, Bld 131 (*) 70 - 99 (mg/dL)    BUN 47 (*) 6 - 23 (mg/dL)    Creatinine, Ser 9.14 (*) 0.50 - 1.35 (mg/dL)    Calcium 6.6 (*) 8.4 - 10.5 (mg/dL)    GFR calc non Af Amer 18 (*) >90 (mL/min)    GFR calc Af Amer 21 (*) >90 (mL/min)   BLOOD GAS, ARTERIAL     Status: Abnormal   Collection Time   02/19/11 12:15 AM      Component Value Range Comment   FIO2 30.00      Delivery systems VENTILATOR      Mode PRESSURE REGULATED VOLUME CONTROL      VT  470      Rate 23      Peep/cpap 5.0      pH, Arterial 7.350  7.350 - 7.450     pCO2 arterial 25.4 (*) 35.0 - 45.0 (mmHg)    pO2, Arterial 78.0 (*) 80.0 - 100.0 (mmHg)    Bicarbonate 13.7 (*) 20.0 - 24.0 (mEq/L)    TCO2 12.5  0 - 100 (mmol/L)    Acid-Base Excess 10.8 (*) 0.0 - 2.0 (mmol/L)    O2 Saturation 95.7      Collection site FEMORAL ARTERY      Drawn by [id number]      Sample type ARTERIAL      Allens test (pass/fail) NOT INDICATED (*) PASS    BLOOD GAS, ARTERIAL     Status: Abnormal   Collection Time   02/19/11  4:15 AM      Component Value Range Comment   FIO2 30.00      Delivery systems VENTILATOR      Mode PRESSURE REGULATED VOLUME CONTROL  VT 470      Rate 23      Peep/cpap 5.0      pH, Arterial 7.359  7.350 - 7.450     pCO2 arterial 27.2 (*) 35.0 - 45.0 (mmHg)    pO2, Arterial 90.2  80.0 - 100.0 (mmHg)    Bicarbonate 15.0 (*) 20.0 - 24.0 (mEq/L)    TCO2 13.7  0 - 100 (mmol/L)    Acid-base deficit 9.4 (*) 0.0 - 2.0 (mmol/L)    O2 Saturation 97.2      Patient temperature 37.0      Collection site FEMORAL ARTERY      Drawn by [id number]      Sample type ARTERIAL      Allens test (pass/fail) NOT INDICATED (*) PASS    CBC     Status: Abnormal   Collection Time   02/19/11  4:30 AM      Component Value Range Comment   WBC 10.7 (*) 4.0 - 10.5 (K/uL)    RBC 3.38 (*) 4.22 - 5.81 (MIL/uL)    Hemoglobin 9.8 (*) 13.0 - 17.0 (g/dL)    HCT 16.1 (*) 09.6 - 52.0 (%)    MCV 90.5  78.0 - 100.0 (fL)    MCH 29.0  26.0 - 34.0 (pg)    MCHC 32.0  30.0 - 36.0 (g/dL)    RDW 04.5 (*) 40.9 - 15.5 (%)    Platelets 520 (*) 150 - 400 (K/uL)   MAGNESIUM     Status: Normal   Collection Time   02/19/11  4:30 AM      Component Value Range Comment   Magnesium 1.9  1.5 - 2.5 (mg/dL)   PHOSPHORUS     Status: Abnormal   Collection Time   02/19/11  4:30 AM      Component Value Range Comment   Phosphorus 5.4 (*) 2.3 - 4.6 (mg/dL)   BASIC METABOLIC PANEL     Status: Abnormal   Collection Time    02/19/11  4:30 AM      Component Value Range Comment   Sodium 138  135 - 145 (mEq/L)    Potassium 4.1  3.5 - 5.1 (mEq/L)    Chloride 111  96 - 112 (mEq/L)    CO2 16 (*) 19 - 32 (mEq/L)    Glucose, Bld 123 (*) 70 - 99 (mg/dL)    BUN 51 (*) 6 - 23 (mg/dL)    Creatinine, Ser 8.11 (*) 0.50 - 1.35 (mg/dL)    Calcium 6.3 (*) 8.4 - 10.5 (mg/dL)    GFR calc non Af Amer 15 (*) >90 (mL/min)    GFR calc Af Amer 17 (*) >90 (mL/min)   CULTURE, BLOOD (ROUTINE X 2)     Status: Normal (Preliminary result)   Collection Time   02/19/11  5:42 AM      Component Value Range Comment   Specimen Description BLOOD BLOOD LEFT HAND      Special Requests [facility] 2CC      Culture PENDING      Report Status PENDING     CULTURE, BLOOD (ROUTINE X 2)     Status: Normal (Preliminary result)   Collection Time   02/19/11  5:43 AM      Component Value Range Comment   Specimen Description BLOOD BLOOD LEFT ARM      Special Requests [facility] E[facility]      Culture PENDING      Report Status PENDING  Ct Abdomen Pelvis W Contrast  02/17/2011  *RADIOLOGY REPORT*  Clinical Data: Fatigue.  Abdominal pain.  Evaluate for intestinal perforation.  Abdominal pain.  Nausea.  CT ABDOMEN AND PELVIS WITH CONTRAST  Technique:  Multidetector CT imaging of the abdomen and pelvis was performed following the standard protocol during bolus administration of intravenous contrast.  Contrast: OMNIPAQUE IOHEXOL 300 MG/ML IV SOLN  Comparison: Plain films earlier in the day.  Findings: Clear lung bases.  Mild cardiomegaly without pericardial or pleural effusion.  Suspect mild hepatic steatosis.  No focal liver lesion.  Normal spleen.  Wall thickening at the gastric antrum and pylorus.  Free spill of contrast along the left lobe of the liver with extensive free intraperitoneal air.  Former finding on image 21. Contrast spill on image 31 coronal from the antropyloric region. Adjacent gastrocolic ligament nodes measure up to 8 mm on image 31.  No  evidence of abscess.  Normal pancreas, gallbladder, biliary tract, adrenal glands.  Lower pole right renal too small to characterize lesion, likely a cyst.  Mild bilateral renal cortical thinning.  Other too small to characterize bilateral renal lesions. No retroperitoneal or retrocrural adenopathy.  Extensive colonic diverticulosis.  Normal terminal ileum and appendix.  Small volume perihepatic and perisplenic fluid and contrast  Normal caliber of small bowel loops.  Fat containing bilateral inguinal hernias. No pelvic adenopathy. Normal urinary bladder.  Mild prostatomegaly, with prominence of the median lobe.  Fluid and extra enteric contrast in the cul-de- sac on image 82.  Small volume. No acute osseous abnormality.  IMPRESSION:  1.  Free intraperitoneal air is secondary to perforation of the antropyloric region.  Underlying wall thickening could be related to gastritis/duodenitis or ulcer. 2.  Small volume abdominal and pelvic extraenteric fluid/contrast. 3.  Small nodes in the gastrocolic ligament.  Likely reactive. Recommend attention on follow-up.  Original Report Authenticated By: Consuello Bossier, M.D.   Dg Chest Portable 1 View  02/19/2011  *RADIOLOGY REPORT*  Clinical Data: Respiratory failure  PORTABLE CHEST - 1 VIEW  Comparison:   the previous day's study  Findings: Endotracheal tube, nasogastric tube, and left subclavian central line are stable in position.  Low lung volumes.  Persistent bibasilar atelectasis or consolidation.  Suspect small left effusion.  Heart size within normal limits for technique.  IMPRESSION:  1.  Persistent low lung volumes with bibasilar atelectasis/consolidation. 2. Support hardware stable in position.  Original Report Authenticated By: Osa Craver, M.D.   Dg Chest Portable 1 View  02/18/2011  *RADIOLOGY REPORT*  Clinical Data: Status post intubation for respiratory failure.  PORTABLE CHEST - 1 VIEW  Comparison: 02/17/2011  Findings: Endotracheal tube  present with tip approximately 3 cm above the carina.  Lung volumes are extremely low bilaterally with bibasilar atelectasis present.  No evidence of pneumothorax, edema or pleural effusion.  A nasogastric tube extends into the stomach. Central line is in stable position.  Surgical drain visible in the upper abdomen.  IMPRESSION: Endotracheal tube tip approximately 3 cm above the carina.  Lungs show low volumes with bibasilar atelectasis.  Original Report Authenticated By: Reola Calkins, M.D.   Dg Chest Portable 1 View  02/17/2011  *RADIOLOGY REPORT*  Clinical Data: Line placement  PORTABLE CHEST - 1 VIEW  Comparison: None.  Findings: There is an NG tube with tip in the esophagus.  Left subclavian catheter with tip projecting over the left brachiocephalic vein/SVC confluence.  Lungs are hypoaerated. Bibasilar linear opacities are favored to  be scarring or atelectasis.  Cardiomediastinal contours mildly prominent however likely exaggerated by technique/respiratory effort.  No acute osseous abnormality identified.  IMPRESSION: NG tube tip projects over the esophagus.  Recommend advancement.  Subclavian catheter tip projects over the left brachiocephalic vein/SVC confluence.  No pneumothorax.  Original Report Authenticated By: Waneta Martins, M.D.   Dg Abd Acute W/chest  02/17/2011  *RADIOLOGY REPORT*  Clinical Data: Abdominal pain and GI bleed.  ACUTE ABDOMEN SERIES (ABDOMEN 2 VIEW & CHEST 1 VIEW)  Comparison: None.  Findings: Frontal view of the chest shows midline trachea.  Heart size is accentuated by low lung volumes and AP technique.  Linear densities are seen in both lung bases.  Lucencies are seen beneath the hemidiaphragms bilaterally.  Two views of the abdomen show gas and stool in the colon.  No small bowel dilatation.  IMPRESSION:  1.  Pneumoperitoneum. Critical Value/emergent results were called by telephone at the time of interpretation on 02/17/2011  at 1210 hours  to  Dr. Oletta Lamas, who  verbally acknowledged these results. 2.  Bibasilar atelectasis and/or scarring.  Original Report Authenticated By: Reyes Ivan, M.D.    Review of Systems  Gastrointestinal: Positive for abdominal pain.   Blood pressure 112/49, pulse 103, temperature 100.8 F (38.2 C), temperature source Oral, resp. rate 23, height 5\' 6"  (1.676 m), weight 93.1 kg (205 lb 4 oz), SpO2 99.00%. Physical Exam  Eyes: No scleral icterus.  Cardiovascular: Normal rate, regular rhythm and normal heart sounds.  Exam reveals no gallop.   No murmur heard. Respiratory: He has no wheezes.  GI: He exhibits no distension.  Musculoskeletal: He exhibits edema.    Assessment/Plan: Problem #1 acute kidney injury at this moment seems to be multifactorial including ATN from septic shock versus dye-induced acute kidney injury because of contrast. The initial blood work indicate a creatinine of 1.5 in the CT scan shows patient has mild cortical thinning which could be a sign of underlying chronic renal failure. At this moment there is no additional history or lab to see what his baseline creatinine is. Presently .patient is oliguric Problem #2 septic shock he was on dopamine which has been weaned off. His blood pressure seems to be somewhat better now. His blood culture is pending and his white blood cell count is slightly high. Problem #3 status post laparoscopic surgery for perforated viscus. Problem #4 respiratory failure patient is intubated. Problem #5 anemia Problem #6 history of  metabolic acidosis Problem #7 history of hypocalcemia possibly secondary to low albumin however at this moment free serum calcium oral moment is not available. Problem #8 history of hyperphosphatemia mild. Recommendation we'll check a serum calcium level of albumin today. Will start him on Lasix drip to improve his urine output. Since his blood pressure still low probably will continue his he IV fluid. We'll check his basic metabolic panel  and CBC in the morning. We'll check his UA. We'll continue his other medications and we'll follow patient. If his urine output doesn't improve and then creatinine continued to increase probably would do an ultrasound of the kidneys to rule out obstruction.   Joeanthony Seeling S 02/19/2011, 10:40 AM

## 2011-02-19 NOTE — Addendum Note (Signed)
Addendum  created 02/19/11 1229 by Glynn Octave   Modules edited:Anesthesia Events, Notes Section

## 2011-02-19 NOTE — Progress Notes (Signed)
Temp 102 Add vanc, diflucan BC  X 2

## 2011-02-19 NOTE — Progress Notes (Signed)
1 Day Post-Op  Subjective: Intubated, apparently comfortable on the ventilator  Objective: Vital signs in last 24 hours: Temp:  [99.2 F (37.3 C)-102.6 F (39.2 C)] 100.9 F (38.3 C) (10/12 0600) Pulse Rate:  [101-141] 102  (10/12 0700) Resp:  [15-36] 26  (10/12 0700) BP: (57-126)/(34-96) 123/50 mmHg (10/12 0700) SpO2:  [84 %-100 %] 100 % (10/12 0700) Arterial Line BP: (85-142)/(44-63) 116/54 mmHg (10/12 0700) FiO2 (%):  [29.8 %-100 %] 30 % (10/12 0729) Weight:  [93.1 kg (205 lb 4 oz)] 205 lb 4 oz (93.1 kg) (10/12 0500) Last BM Date: 02/17/11  Intake/Output from previous day: 10/11 0701 - 10/12 0700 In: 9400.8 [I.V.:4613.5; Blood:600; IV Piggyback:4187.3] Out: 1660 [Urine:545; Emesis/NG output:550; Drains:540; Blood:25] Intake/Output this shift:    General appearance: Sedated, intubated Resp: clear to auscultation bilaterally Cardio: regular rate and rhythm, S1, S2 normal, no murmur, click, rub or gallop Abdomen: Soft, flat. Dressing dry and intact. Jackson-Pratt drain with some slightly bilious fluid.  Lab Results:   [facility] 02/19/11 0430 02/18/11 1843  WBC 10.7* 9.4  HGB 9.8* 11.2*  HCT 30.6* 34.2*  PLT 520* 506*   BMET  Basename 02/19/11 0430 02/18/11 2318  NA 138 141  K 4.1 4.5  CL 111 116*  CO2 16* 14*  GLUCOSE 123* 131*  BUN 51* 47*  CREATININE 3.88* 3.31*  CALCIUM 6.3* 6.6*   PT/INR  Basename 02/17/11 2214 02/17/11 1025  LABPROT 17.0* 14.7  INR 1.36 1.13    Studies/Results: Ct Abdomen Pelvis W Contrast  02/17/2011  *RADIOLOGY REPORT*  Clinical Data: Fatigue.  Abdominal pain.  Evaluate for intestinal perforation.  Abdominal pain.  Nausea.  CT ABDOMEN AND PELVIS WITH CONTRAST  Technique:  Multidetector CT imaging of the abdomen and pelvis was performed following the standard protocol during bolus administration of intravenous contrast.  Contrast: OMNIPAQUE IOHEXOL 300 MG/ML IV SOLN  Comparison: Plain films earlier in the day.  Findings: Clear  lung bases.  Mild cardiomegaly without pericardial or pleural effusion.  Suspect mild hepatic steatosis.  No focal liver lesion.  Normal spleen.  Wall thickening at the gastric antrum and pylorus.  Free spill of contrast along the left lobe of the liver with extensive free intraperitoneal air.  Former finding on image 21. Contrast spill on image 31 coronal from the antropyloric region. Adjacent gastrocolic ligament nodes measure up to 8 mm on image 31.  No evidence of abscess.  Normal pancreas, gallbladder, biliary tract, adrenal glands.  Lower pole right renal too small to characterize lesion, likely a cyst.  Mild bilateral renal cortical thinning.  Other too small to characterize bilateral renal lesions. No retroperitoneal or retrocrural adenopathy.  Extensive colonic diverticulosis.  Normal terminal ileum and appendix.  Small volume perihepatic and perisplenic fluid and contrast  Normal caliber of small bowel loops.  Fat containing bilateral inguinal hernias. No pelvic adenopathy. Normal urinary bladder.  Mild prostatomegaly, with prominence of the median lobe.  Fluid and extra enteric contrast in the cul-de- sac on image 82.  Small volume. No acute osseous abnormality.  IMPRESSION:  1.  Free intraperitoneal air is secondary to perforation of the antropyloric region.  Underlying wall thickening could be related to gastritis/duodenitis or ulcer. 2.  Small volume abdominal and pelvic extraenteric fluid/contrast. 3.  Small nodes in the gastrocolic ligament.  Likely reactive. Recommend attention on follow-up.  Original Report Authenticated By: Consuello Bossier, M.D.   Dg Chest Portable 1 View  02/19/2011  *RADIOLOGY REPORT*  Clinical Data: Respiratory  failure  PORTABLE CHEST - 1 VIEW  Comparison:   the previous day's study  Findings: Endotracheal tube, nasogastric tube, and left subclavian central line are stable in position.  Low lung volumes.  Persistent bibasilar atelectasis or consolidation.  Suspect small left  effusion.  Heart size within normal limits for technique.  IMPRESSION:  1.  Persistent low lung volumes with bibasilar atelectasis/consolidation. 2. Support hardware stable in position.  Original Report Authenticated By: Osa Craver, M.D.   Dg Chest Portable 1 View  02/18/2011  *RADIOLOGY REPORT*  Clinical Data: Status post intubation for respiratory failure.  PORTABLE CHEST - 1 VIEW  Comparison: 02/17/2011  Findings: Endotracheal tube present with tip approximately 3 cm above the carina.  Lung volumes are extremely low bilaterally with bibasilar atelectasis present.  No evidence of pneumothorax, edema or pleural effusion.  A nasogastric tube extends into the stomach. Central line is in stable position.  Surgical drain visible in the upper abdomen.  IMPRESSION: Endotracheal tube tip approximately 3 cm above the carina.  Lungs show low volumes with bibasilar atelectasis.  Original Report Authenticated By: Reola Calkins, M.D.   Dg Chest Portable 1 View  02/17/2011  *RADIOLOGY REPORT*  Clinical Data: Line placement  PORTABLE CHEST - 1 VIEW  Comparison: None.  Findings: There is an NG tube with tip in the esophagus.  Left subclavian catheter with tip projecting over the left brachiocephalic vein/SVC confluence.  Lungs are hypoaerated. Bibasilar linear opacities are favored to be scarring or atelectasis.  Cardiomediastinal contours mildly prominent however likely exaggerated by technique/respiratory effort.  No acute osseous abnormality identified.  IMPRESSION: NG tube tip projects over the esophagus.  Recommend advancement.  Subclavian catheter tip projects over the left brachiocephalic vein/SVC confluence.  No pneumothorax.  Original Report Authenticated By: Waneta Martins, M.D.   Dg Abd Acute W/chest  02/17/2011  *RADIOLOGY REPORT*  Clinical Data: Abdominal pain and GI bleed.  ACUTE ABDOMEN SERIES (ABDOMEN 2 VIEW & CHEST 1 VIEW)  Comparison: None.  Findings: Frontal view of the chest  shows midline trachea.  Heart size is accentuated by low lung volumes and AP technique.  Linear densities are seen in both lung bases.  Lucencies are seen beneath the hemidiaphragms bilaterally.  Two views of the abdomen show gas and stool in the colon.  No small bowel dilatation.  IMPRESSION:  1.  Pneumoperitoneum. Critical Value/emergent results were called by telephone at the time of interpretation on 02/17/2011  at 1210 hours  to  Dr. Oletta Lamas, who verbally acknowledged these results. 2.  Bibasilar atelectasis and/or scarring.  Original Report Authenticated By: Reyes Ivan, M.D.    Anti-infectives: Anti-infectives     Start     Dose/Rate Route Frequency Ordered Stop   02/19/11 0600   fluconazole (DIFLUCAN) IVPB 400 mg        400 mg 200 mL/hr over 60 Minutes Intravenous  Once 02/19/11 0554 02/19/11 0703   02/19/11 0530   vancomycin (VANCOCIN) IVPB 1000 mg/200 mL premix  Status:  Discontinued        1,000 mg 200 mL/hr over 60 Minutes Intravenous Every 12 hours 02/19/11 0523 02/19/11 0745   02/19/11 0530   fluconazole (DIFLUCAN) IVPB 800 mg  Status:  Discontinued        800 mg 400 mL/hr over 60 Minutes Intravenous Every 24 hours 02/19/11 0523 02/19/11 0553   02/17/11 1700   piperacillin-tazobactam (ZOSYN) IVPB 3.375 g  Status:  Discontinued  3.375 g 12.5 mL/hr over 240 Minutes Intravenous Every 8 hours 02/17/11 1442 02/19/11 0556          Assessment/Plan: s/p Procedure(s): EXPLORATORY LAPAROTOMY Impression: 1.  Multiple organ failure secondary to peritonitis, blood pressure improved, dopamine has been weaned off, he continues on the left side 2. metabolic acidosis, on bicarbonate supplementation 3. renal failure, creatinine somewhat stabilizing, though urine output low. Dr. Fausto Skillern of nephrology has been consulted 4. status post gastrorrhaphy, and appropriate position. Nasogastric tube was reproduced positioned as an orogastric tube as the NG tube was pulled for ventilatory  protocol 5. respiratory failure, stable, being followed by Dr. Juanetta Gosling of pulmonology 6. there is a question of whether the patient was transferred to critical care at [facility] hospital for possible CAVH.   LOS: 2 days    Vernell Townley A 02/19/2011

## 2011-02-19 NOTE — Progress Notes (Signed)
Subjective: He had problems overnight with low urine output which did respond to some extent to fluid bolus and with fever. He had blood cultures done he is now on vancomycin and Diflucan. His urine output has improved somewhat. He is still somewhat hypotensive and remains on Levofed.Jason Holt He remains intubated and on the ventilator.  Objective: Vital signs in last 24 hours: Temp:  [97.9 F (36.6 C)-102.6 F (39.2 C)] 100.9 F (38.3 C) (10/12 0600) Pulse Rate:  [101-141] 102  (10/12 0700) Resp:  [15-36] 26  (10/12 0700) BP: (57-126)/(34-96) 123/50 mmHg (10/12 0700) SpO2:  [84 %-100 %] 100 % (10/12 0700) Arterial Line BP: (85-142)/(44-63) 116/54 mmHg (10/12 0700) FiO2 (%):  [29.8 %-100 %] 30 % (10/12 0729) Weight:  [93.1 kg (205 lb 4 oz)] 205 lb 4 oz (93.1 kg) (10/12 0500) Weight change: 11.452 kg (25 lb 4 oz) Last BM Date: 02/17/11  Intake/Output from previous day: 10/11 0701 - 10/12 0700 In: 9400.8 [I.V.:4613.5; Blood:600; IV Piggyback:4187.3] Out: 1660 [Urine:545; Emesis/NG output:550; Drains:540; Blood:25]  PHYSICAL EXAM General appearance: He is undergoing his wakeup assessment and complaint of pain in his abdomen at the surgical site. He is intubated on the ventilator and sedated but less so than earlier so we can do his wakeup assessment Resp: rhonchi bilaterally Cardio: regular rate and rhythm, S1, S2 normal, no murmur, click, rub or gallop GI: soft, non-tender; bowel sounds normal; no masses,  no organomegaly Extremities: extremities normal, atraumatic, no cyanosis or edema  Lab Results:    Basic Metabolic Panel:  Basename 02/19/11 0430 02/18/11 2318 02/18/11 1843  NA 138 141 --  K 4.1 4.5 --  CL 111 116* --  CO2 16* 14* --  GLUCOSE 123* 131* --  BUN 51* 47* --  CREATININE 3.88* 3.31* --  CALCIUM 6.3* 6.6* --  MG 1.9 -- 2.2  PHOS 5.4* -- 5.8*   Liver Function Tests:  Basename 02/18/11 1843 02/17/11 2214  AST 20 14  ALT 5 7  ALKPHOS 54 50  BILITOT 0.2* 0.1*    PROT 4.0* 4.1*  ALBUMIN 1.5* 1.9*   No results found for this basename: LIPASE:2,AMYLASE:2 in the last 72 hours No results found for this basename: AMMONIA:2 in the last 72 hours CBC:  Basename 02/19/11 0430 02/18/11 1843 02/17/11 2214  WBC 10.7* 9.4 --  NEUTROABS -- 7.6 9.4*  HGB 9.8* 11.2* --  HCT 30.6* 34.2* --  MCV 90.5 91.2 --  PLT 520* 506* --   Cardiac Enzymes: No results found for this basename: CKTOTAL:3,CKMB:3,CKMBINDEX:3,TROPONINI:3 in the last 72 hours BNP: No results found for this basename: POCBNP:3 in the last 72 hours D-Dimer: No results found for this basename: DDIMER:2 in the last 72 hours CBG: No results found for this basename: GLUCAP:6 in the last 72 hours Hemoglobin A1C: No results found for this basename: HGBA1C in the last 72 hours Fasting Lipid Panel: No results found for this basename: CHOL,HDL,LDLCALC,TRIG,CHOLHDL,LDLDIRECT in the last 72 hours Thyroid Function Tests: No results found for this basename: TSH,T4TOTAL,FREET4,T3FREE,THYROIDAB in the last 72 hours Anemia Panel: No results found for this basename: VITAMINB12,FOLATE,FERRITIN,TIBC,IRON,RETICCTPCT in the last 72 hours Urine Drug Screen:  Alcohol Level: No results found for this basename: ETH:2 in the last 72 hours Urinalysis:  Misc. Labs:  ABGS  Basename 02/19/11 0415  PHART 7.359  PO2ART 90.2  TCO2 13.7  HCO3 15.0*   CULTURES Recent Results (from the past 240 hour(s))  MRSA PCR SCREENING     Status: Normal   Collection  Time   02/17/11  7:17 PM      Component Value Range Status Comment   MRSA by PCR NEGATIVE  NEGATIVE  Final   SURGICAL PCR SCREEN     Status: Abnormal   Collection Time   02/18/11  1:59 AM      Component Value Range Status Comment   MRSA, PCR NEGATIVE  NEGATIVE  Final    Staphylococcus aureus POSITIVE (*) NEGATIVE  Final   CULTURE, BLOOD (ROUTINE X 2)     Status: Normal (Preliminary result)   Collection Time   02/19/11  5:42 AM      Component Value  Range Status Comment   Specimen Description BLOOD BLOOD LEFT HAND   Final    Special Requests AEB 2CC   Final    Culture PENDING   Incomplete    Report Status PENDING   Incomplete   CULTURE, BLOOD (ROUTINE X 2)     Status: Normal (Preliminary result)   Collection Time   02/19/11  5:43 AM      Component Value Range Status Comment   Specimen Description BLOOD BLOOD LEFT ARM   Final    Special Requests [facility]   Final    Culture PENDING   Incomplete    Report Status PENDING   Incomplete    Studies/Results: Ct Abdomen Pelvis W Contrast  02/17/2011  *RADIOLOGY REPORT*  Clinical Data: Fatigue.  Abdominal pain.  Evaluate for intestinal perforation.  Abdominal pain.  Nausea.  CT ABDOMEN AND PELVIS WITH CONTRAST  Technique:  Multidetector CT imaging of the abdomen and pelvis was performed following the standard protocol during bolus administration of intravenous contrast.  Contrast: OMNIPAQUE IOHEXOL 300 MG/ML IV SOLN  Comparison: Plain films earlier in the day.  Findings: Clear lung bases.  Mild cardiomegaly without pericardial or pleural effusion.  Suspect mild hepatic steatosis.  No focal liver lesion.  Normal spleen.  Wall thickening at the gastric antrum and pylorus.  Free spill of contrast along the left lobe of the liver with extensive free intraperitoneal air.  Former finding on image 21. Contrast spill on image 31 coronal from the antropyloric region. Adjacent gastrocolic ligament nodes measure up to 8 mm on image 31.  No evidence of abscess.  Normal pancreas, gallbladder, biliary tract, adrenal glands.  Lower pole right renal too small to characterize lesion, likely a cyst.  Mild bilateral renal cortical thinning.  Other too small to characterize bilateral renal lesions. No retroperitoneal or retrocrural adenopathy.  Extensive colonic diverticulosis.  Normal terminal ileum and appendix.  Small volume perihepatic and perisplenic fluid and contrast  Normal caliber of small bowel loops.   Fat containing bilateral inguinal hernias. No pelvic adenopathy. Normal urinary bladder.  Mild prostatomegaly, with prominence of the median lobe.  Fluid and extra enteric contrast in the cul-de- sac on image 82.  Small volume. No acute osseous abnormality.  IMPRESSION:  1.  Free intraperitoneal air is secondary to perforation of the antropyloric region.  Underlying wall thickening could be related to gastritis/duodenitis or ulcer. 2.  Small volume abdominal and pelvic extraenteric fluid/contrast. 3.  Small nodes in the gastrocolic ligament.  Likely reactive. Recommend attention on follow-up.  Original Report Authenticated By: Consuello Bossier, M.D.   Dg Chest Portable 1 View  02/18/2011  *RADIOLOGY REPORT*  Clinical Data: Status post intubation for respiratory failure.  PORTABLE CHEST - 1 VIEW  Comparison: 02/17/2011  Findings: Endotracheal tube present with tip approximately 3 cm above the carina.  Lung volumes are extremely low bilaterally with bibasilar atelectasis present.  No evidence of pneumothorax, edema or pleural effusion.  A nasogastric tube extends into the stomach. Central line is in stable position.  Surgical drain visible in the upper abdomen.  IMPRESSION: Endotracheal tube tip approximately 3 cm above the carina.  Lungs show low volumes with bibasilar atelectasis.  Original Report Authenticated By: Reola Calkins, M.D.   Dg Chest Portable 1 View  02/17/2011  *RADIOLOGY REPORT*  Clinical Data: Line placement  PORTABLE CHEST - 1 VIEW  Comparison: None.  Findings: There is an NG tube with tip in the esophagus.  Left subclavian catheter with tip projecting over the left brachiocephalic vein/SVC confluence.  Lungs are hypoaerated. Bibasilar linear opacities are favored to be scarring or atelectasis.  Cardiomediastinal contours mildly prominent however likely exaggerated by technique/respiratory effort.  No acute osseous abnormality identified.  IMPRESSION: NG tube tip projects over the  esophagus.  Recommend advancement.  Subclavian catheter tip projects over the left brachiocephalic vein/SVC confluence.  No pneumothorax.  Original Report Authenticated By: Waneta Martins, M.D.   Dg Abd Acute W/chest  02/17/2011  *RADIOLOGY REPORT*  Clinical Data: Abdominal pain and GI bleed.  ACUTE ABDOMEN SERIES (ABDOMEN 2 VIEW & CHEST 1 VIEW)  Comparison: None.  Findings: Frontal view of the chest shows midline trachea.  Heart size is accentuated by low lung volumes and AP technique.  Linear densities are seen in both lung bases.  Lucencies are seen beneath the hemidiaphragms bilaterally.  Two views of the abdomen show gas and stool in the colon.  No small bowel dilatation.  IMPRESSION:  1.  Pneumoperitoneum. Critical Value/emergent results were called by telephone at the time of interpretation on 02/17/2011  at 1210 hours  to  Dr. Oletta Lamas, who verbally acknowledged these results. 2.  Bibasilar atelectasis and/or scarring.  Original Report Authenticated By: Reyes Ivan, M.D.    Medications:  Scheduled:   . sodium chloride   Intravenous Once  . acetaminophen  1,000 mg Intravenous Q6H  . antiseptic oral rinse   Mouth Rinse QID  . chlorhexidine  15 mL Mouth/Throat BID  . Chlorhexidine Gluconate Cloth  6 each Topical Q0600  . DOPamine      . enoxaparin  40 mg Subcutaneous Q24H  . etomidate      . fentaNYL      . fluconazole (DIFLUCAN) IV  400 mg Intravenous Once  . furosemide  40 mg Intravenous Once  . lidocaine (cardiac) 100 mg/51ml      . midazolam      . mupirocin   Topical BID  . pantoprazole (PROTONIX) IV  40 mg Intravenous Q24H  . phenylephrine      . phenylephrine  100 mcg Intravenous Once  . piperacillin-tazobactam (ZOSYN)  IV  2.25 g Intravenous Q8H  . propofol      . rocuronium      . rocuronium      . sodium bicarbonate      . sodium chloride  1,000 mL Intravenous Once  . sodium chloride  1,000 mL Intravenous Once  . sodium chloride  1,000 mL Intravenous Once  .  sodium chloride  10 mL Intracatheter Q12H  . succinylcholine      . vancomycin  1,000 mg Intravenous Q12H  . DISCONTD: sodium chloride   Intravenous Once  . DISCONTD: fluconazole (DIFLUCAN) IV  800 mg Intravenous Q24H  . DISCONTD: piperacillin-tazobactam (ZOSYN)  IV  3.375 g Intravenous Q8H  . DISCONTD:  sodium bicarbonate  50 mEq Intravenous Once   Continuous:   . DOPamine Stopped (02/18/11 2235)  . norepinephrine (LEVOPHED) Adult infusion 14 mcg/min (02/19/11 0715)  . sodium chloride 0.45 % 1,000 mL with sodium bicarbonate 1 mEq/mL 100 mEq infusion 150 mL/hr at 02/19/11 0700  . DISCONTD: sodium chloride    . DISCONTD: dextrose 5 % and 0.9% NaCl Stopped (02/18/11 1817)  . DISCONTD: lactated ringers Stopped (02/19/11 0102)  . DISCONTD: norepinephrine (LEVOPHED) Adult infusion Stopped (02/19/11 0330)   ZOX:WRUEAVWU, HYDROmorphone, levalbuterol, midazolam, ondansetron, sodium chloride, DISCONTD: povidone-iodine, DISCONTD: sodium chloride  Assesment:he has respiratory failure post-op. He has signs and symptoms of sepsis and is on multiple antibiotics. He is still requiring pressor support and his renal function has deteriorated.Chest x ray is not as suggestive of ards but with ovwerall clinical picture  I will leave him on ARDS protocol for ventilation Active Problems:  * No active hospital problems. *     Plan:continue with current treatments    LOS: 2 days   Jull Harral L 02/19/2011, 7:40 AM

## 2011-02-19 NOTE — Anesthesia Post-op Follow-up Note (Signed)
Patient remains in the ICU on the ventilator.  Respiratory status is stable though no immediate plans to extubate.  Kidney function is deminished, but is presently on levophed to maintain BP.  Some discussion on the possibility of transferring to [facility] for CVVH, Present problems more likely as a result of medical problems than anesthesia.

## 2011-02-19 NOTE — Consult Note (Signed)
ANTIBIOTIC CONSULT NOTE - INITIAL  Pharmacy Consult for vancomycin and fluconazole Indication: possible sepsis, pna  Allergies  Allergen Reactions  . Eggs Or Egg-Derived Products    Patient Measurements: Height: 5\' 6"  (167.6 cm) Weight: 205 lb 4 oz (93.1 kg) IBW/kg (Calculated) : 63.8   Vital Signs: Temp: 100.8 F (38.2 C) (10/12 0800) Temp src: Oral (10/12 0800) BP: 112/49 mmHg (10/12 1000) Pulse Rate: 103  (10/12 1000) Intake/Output from previous day: 10/11 0701 - 10/12 0700 In: 9400.8 [I.V.:4613.5; Blood:600; IV Piggyback:4187.3] Out: 1660 [Urine:545; Emesis/NG output:550; Drains:540; Blood:25] Intake/Output from this shift: Total I/O In: -  Out: 25 [Drains:25]  Labs:  [facility] 02/19/11 0430 02/18/11 2318 02/18/11 1843 02/18/11 0405  WBC 10.7* -- 9.4 11.0*  HGB 9.8* -- 11.2* 9.1*  PLT 520* -- 506* 606*  LABCREA -- -- -- --  CREATININE 3.88* 3.31* 3.04* --   Estimated Creatinine Clearance: 20 ml/min (by C-G formula based on Cr of 3.88). No results found for this basename: VANCOTROUGH:2,VANCOPEAK:2,VANCORANDOM:2,GENTTROUGH:2,GENTPEAK:2,GENTRANDOM:2,TOBRATROUGH:2,TOBRAPEAK:2,TOBRARND:2,AMIKACINPEAK:2,AMIKACINTROU:2,AMIKACIN:2, in the last 72 hours   Microbiology: Recent Results (from the past 720 hour(s))  MRSA PCR SCREENING     Status: Normal   Collection Time   02/17/11  7:17 PM      Component Value Range Status Comment   MRSA by PCR NEGATIVE  NEGATIVE  Final   SURGICAL PCR SCREEN     Status: Abnormal   Collection Time   02/18/11  1:59 AM      Component Value Range Status Comment   MRSA, PCR NEGATIVE  NEGATIVE  Final    Staphylococcus aureus POSITIVE (*) NEGATIVE  Final   CULTURE, BLOOD (ROUTINE X 2)     Status: Normal (Preliminary result)   Collection Time   02/19/11  5:42 AM      Component Value Range Status Comment   Specimen Description BLOOD BLOOD LEFT HAND   Final    Special Requests AEB 2CC   Final    Culture PENDING   Incomplete    Report Status  PENDING   Incomplete   CULTURE, BLOOD (ROUTINE X 2)     Status: Normal (Preliminary result)   Collection Time   02/19/11  5:43 AM      Component Value Range Status Comment   Specimen Description BLOOD BLOOD LEFT ARM   Final    Special Requests [facility] EACH   Final    Culture PENDING   Incomplete    Report Status PENDING   Incomplete    Medical History: Past Medical History  Diagnosis Date  . Depression    Medications:  Scheduled:    . sodium chloride   Intravenous Once  . acetaminophen  1,000 mg Intravenous Q6H  . antiseptic oral rinse   Mouth Rinse QID  . chlorhexidine  15 mL Mouth/Throat BID  . Chlorhexidine Gluconate Cloth  6 each Topical Q0600  . DOPamine      . enoxaparin  30 mg Subcutaneous Q24H  . etomidate      . fentaNYL      . fluconazole (DIFLUCAN) IV  200 mg Intravenous Q24H  . fluconazole (DIFLUCAN) IV  400 mg Intravenous Once  . furosemide  40 mg Intravenous Once  . lidocaine (cardiac) 100 mg/64ml      . midazolam      . mupirocin   Topical BID  . pantoprazole (PROTONIX) IV  40 mg Intravenous Q24H  . phenylephrine      . phenylephrine  100 mcg Intravenous Once  .  piperacillin-tazobactam (ZOSYN)  IV  2.25 g Intravenous Q8H  . propofol      . rocuronium      . rocuronium      . sodium bicarbonate      . sodium chloride  1,000 mL Intravenous Once  . sodium chloride  1,000 mL Intravenous Once  . sodium chloride  1,000 mL Intravenous Once  . sodium chloride  10 mL Intracatheter Q12H  . succinylcholine      . vancomycin  1,000 mg Intravenous Q24H  . DISCONTD: sodium chloride   Intravenous Once  . DISCONTD: enoxaparin  40 mg Subcutaneous Q24H  . DISCONTD: fluconazole (DIFLUCAN) IV  800 mg Intravenous Q24H  . DISCONTD: piperacillin-tazobactam (ZOSYN)  IV  3.375 g Intravenous Q8H  . DISCONTD: sodium bicarbonate  50 mEq Intravenous Once  . DISCONTD: vancomycin  1,000 mg Intravenous Q12H   Assessment: Renal failure (awaiting nephrology consult) May be  candidate for dialysis clcr ~ 20 ml/min Multi-organ failure  Goal of Therapy:  Vancomycin trough level 15-20 mcg/ml Eradicate infection.  Plan: Fluconazole 400mg  already given... Fluconazole 200mg  iv daily thereafter for now Vancomycin 1gm iv q24hrs for now.. Check trough at [facility] Will adjust ABX as needed based on nephrology consult and/or need for dialysis and/or drug levels Monitor renal fxn per protocol   Jason Holt A 02/19/2011,10:43 AM

## 2011-02-19 NOTE — Progress Notes (Signed)
CRITICAL VALUE ALERT  Critical value received:  Calcium 6.3  Date of notification:  02/19/11  Time of notification:  0600  Critical value read back:yes  Nurse who received alert:  Jinny Sanders, RN   MD notified (1st page):    Time of first page:    MD notified (2nd page):  Time of second page:  Responding MD:  0600  Time MD responded:  Dr. Tyson Alias notified via phone no new orders were given.

## 2011-02-19 NOTE — Progress Notes (Signed)
  75 cc hr over last 1. Hours Add bicard for NON AG  Repeat bmet

## 2011-02-20 ENCOUNTER — Inpatient Hospital Stay (HOSPITAL_COMMUNITY): Payer: Non-veteran care

## 2011-02-20 ENCOUNTER — Other Ambulatory Visit: Payer: Self-pay

## 2011-02-20 ENCOUNTER — Inpatient Hospital Stay (HOSPITAL_COMMUNITY)
Admission: AD | Admit: 2011-02-20 | Discharge: 2011-03-13 | DRG: 326 | Disposition: A | Discharge status: Other Acute Inpatient Hospital | Payer: Non-veteran care | Source: Other Acute Inpatient Hospital | Attending: Internal Medicine | Admitting: Internal Medicine

## 2011-02-20 DIAGNOSIS — A419 Sepsis, unspecified organism: Secondary | ICD-10-CM

## 2011-02-20 DIAGNOSIS — R6520 Severe sepsis without septic shock: Secondary | ICD-10-CM

## 2011-02-20 DIAGNOSIS — L0291 Cutaneous abscess, unspecified: Secondary | ICD-10-CM

## 2011-02-20 DIAGNOSIS — R6521 Severe sepsis with septic shock: Secondary | ICD-10-CM

## 2011-02-20 DIAGNOSIS — J9600 Acute respiratory failure, unspecified whether with hypoxia or hypercapnia: Secondary | ICD-10-CM

## 2011-02-20 DIAGNOSIS — S31109A Unspecified open wound of abdominal wall, unspecified quadrant without penetration into peritoneal cavity, initial encounter: Secondary | ICD-10-CM

## 2011-02-20 DIAGNOSIS — N170 Acute kidney failure with tubular necrosis: Secondary | ICD-10-CM

## 2011-02-20 LAB — LACTIC ACID, PLASMA: Lactic Acid, Venous: 1 mmol/L (ref 0.5–2.2)

## 2011-02-20 LAB — PRO B NATRIURETIC PEPTIDE: Pro B Natriuretic peptide (BNP): 8616 pg/mL — ABNORMAL HIGH (ref 0–125)

## 2011-02-20 LAB — CBC
HCT: 22.9 % — ABNORMAL LOW (ref 39.0–52.0)
HCT: 31.5 % — ABNORMAL LOW (ref 39.0–52.0)
Hemoglobin: 7.4 g/dL — ABNORMAL LOW (ref 13.0–17.0)
MCHC: 32.3 g/dL (ref 30.0–36.0)
MCV: 89.5 fL (ref 78.0–100.0)
Platelets: 287 10*3/uL (ref 150–400)
RDW: 15.2 % (ref 11.5–15.5)
WBC: 10.5 10*3/uL (ref 4.0–10.5)
WBC: 17.4 10*3/uL — ABNORMAL HIGH (ref 4.0–10.5)

## 2011-02-20 LAB — POCT I-STAT 3, ART BLOOD GAS (G3+)
pCO2 arterial: 31.1 mmHg — ABNORMAL LOW (ref 35.0–45.0)
pH, Arterial: 7.396 (ref 7.350–7.450)
pO2, Arterial: 75 mmHg — ABNORMAL LOW (ref 80.0–100.0)

## 2011-02-20 LAB — COMPREHENSIVE METABOLIC PANEL
ALT: <5 U/L (ref 0–53)
Albumin: 1.6 g/dL — ABNORMAL LOW (ref 3.5–5.2)
Alkaline Phosphatase: 55 U/L (ref 39–117)
Calcium: 5.9 mg/dL — CL (ref 8.4–10.5)
GFR calc Af Amer: 11 mL/min — ABNORMAL LOW (ref >90)
Glucose, Bld: 88 mg/dL (ref 70–99)
Potassium: 3.6 mEq/L (ref 3.5–5.1)
Sodium: 139 mEq/L (ref 135–145)
Total Protein: 4.1 g/dL — ABNORMAL LOW (ref 6.0–8.3)

## 2011-02-20 LAB — BASIC METABOLIC PANEL
BUN: 67 mg/dL — ABNORMAL HIGH (ref 6–23)
GFR calc Af Amer: 8 mL/min — ABNORMAL LOW (ref >90)
GFR calc non Af Amer: 7 mL/min — ABNORMAL LOW (ref >90)
Potassium: 3.6 mEq/L (ref 3.5–5.1)
Sodium: 141 mEq/L (ref 135–145)

## 2011-02-20 LAB — MRSA PCR SCREENING: MRSA by PCR: POSITIVE — AB

## 2011-02-20 LAB — PHOSPHORUS: Phosphorus: 5.8 mg/dL — ABNORMAL HIGH (ref 2.3–4.6)

## 2011-02-20 LAB — URINE MICROSCOPIC-ADD ON

## 2011-02-20 LAB — URINALYSIS, ROUTINE W REFLEX MICROSCOPIC
Bilirubin Urine: NEGATIVE
Nitrite: NEGATIVE
Specific Gravity, Urine: 1.013 (ref 1.005–1.030)
Urobilinogen, UA: 0.2 mg/dL (ref 0.0–1.0)

## 2011-02-20 LAB — PROTIME-INR: INR: 2.39 — ABNORMAL HIGH (ref 0.00–1.49)

## 2011-02-20 LAB — PREPARE RBC (CROSSMATCH)

## 2011-02-20 LAB — APTT: aPTT: 75 seconds — ABNORMAL HIGH (ref 24–37)

## 2011-02-20 LAB — GLUCOSE, CAPILLARY: Glucose-Capillary: 99 mg/dL (ref 70–99)

## 2011-02-20 MED ORDER — PROPOFOL 10 MG/ML IV EMUL
5.0000 ug/kg/min | INTRAVENOUS | Status: DC
  Administered 2011-02-20: 20 ug/kg/min via INTRAVENOUS

## 2011-02-20 MED ORDER — SODIUM CHLORIDE 0.9 % IJ SOLN
INTRAMUSCULAR | Status: AC
  Filled 2011-02-20: qty 20

## 2011-02-20 MED ORDER — VANCOMYCIN HCL IN DEXTROSE 1-5 GM/200ML-% IV SOLN: 1000.0000 mg | INTRAVENOUS | Status: DC

## 2011-02-20 MED ORDER — PROPOFOL 10 MG/ML IV EMUL
INTRAVENOUS | Status: AC
  Filled 2011-02-20: qty 100

## 2011-02-20 MED ORDER — SODIUM BICARBONATE 8.4 % IV SOLN
INTRAVENOUS | Status: AC
  Filled 2011-02-20: qty 100

## 2011-02-20 MED ORDER — ACETAMINOPHEN 160 MG/5ML PO SOLN
650.0000 mg | Freq: Four times a day (QID) | ORAL | Status: DC | PRN
  Administered 2011-02-20 (×2): 650 mg
  Filled 2011-02-20 (×2): qty 20.3

## 2011-02-20 MED ORDER — SODIUM CHLORIDE 0.9 % IV SOLN
25.0000 ug/h | INTRAVENOUS | Status: DC
  Filled 2011-02-20: qty 50

## 2011-02-20 NOTE — Progress Notes (Signed)
Md notified of femoral line connection being loose and this am hbg of 7.4. New orders received.

## 2011-02-20 NOTE — Progress Notes (Signed)
2 Days Post-Op  Subjective: Intubated, on ventilator, comfortable  Objective: Vital signs in last 24 hours: Temp:  [99.4 F (37.4 C)-102 F (38.9 C)] 100.3 F (37.9 C) (10/13 0820) Pulse Rate:  [91-150] 150  (10/13 0800) Resp:  [0-40] 28  (10/13 0800) BP: (73-128)/(43-84) 120/62 mmHg (10/13 0800) SpO2:  [83 %-100 %] 100 % (10/13 0630) FiO2 (%):  [29.7 %-30.3 %] 30.1 % (10/13 0630) Last BM Date: 02/20/11  Intake/Output from previous day: 10/12 0701 - 10/13 0700 In: 4588.9 [I.V.:4038.9; IV Piggyback:550] Out: 606 [Urine:100; Drains:505; Stool:1] Intake/Output this shift: Total I/O In: 200 [IV Piggyback:200] Out: 90 [Drains:90]  Lungs:   Intubuted, CTA Cor:  Sinus tach, without s3, s4, murmurs ABdomen:  Soft, incision healing well.  JP drain clear yellowish-green.  Lab Results:  @LABLAST2 (wbc:2,hgb:2,hct:2,plt:2) BMET  Basename 02/20/11 0443 02/19/11 0430  NA 139 138  K 3.6 4.1  CL 103 111  CO2 21 16*  GLUCOSE 88 123*  BUN 59* 51*  CREATININE 5.81* 3.88*  CALCIUM 5.9* 6.3*   PT/INR  Basename 02/17/11 2214 02/17/11 1025  LABPROT 17.0* 14.7  INR 1.36 1.13   ABG  Basename 02/19/11 0415 02/19/11 0015  PHART 7.359 7.350  HCO3 15.0* 13.7*    Studies/Results: Dg Chest Portable 1 View  02/19/2011  *RADIOLOGY REPORT*  Clinical Data: Respiratory failure  PORTABLE CHEST - 1 VIEW  Comparison:   the previous day's study  Findings: Endotracheal tube, nasogastric tube, and left subclavian central line are stable in position.  Low lung volumes.  Persistent bibasilar atelectasis or consolidation.  Suspect small left effusion.  Heart size within normal limits for technique.  IMPRESSION:  1.  Persistent low lung volumes with bibasilar atelectasis/consolidation. 2. Support hardware stable in position.  Original Report Authenticated By: Osa Craver, M.D.   Dg Chest Portable 1 View  02/18/2011  *RADIOLOGY REPORT*  Clinical Data: Status post intubation for respiratory  failure.  PORTABLE CHEST - 1 VIEW  Comparison: 02/17/2011  Findings: Endotracheal tube present with tip approximately 3 cm above the carina.  Lung volumes are extremely low bilaterally with bibasilar atelectasis present.  No evidence of pneumothorax, edema or pleural effusion.  A nasogastric tube extends into the stomach. Central line is in stable position.  Surgical drain visible in the upper abdomen.  IMPRESSION: Endotracheal tube tip approximately 3 cm above the carina.  Lungs show low volumes with bibasilar atelectasis.  Original Report Authenticated By: Reola Calkins, M.D.    Anti-infectives: Anti-infectives     Start     Dose/Rate Route Frequency Ordered Stop   02/19/11 0600   fluconazole (DIFLUCAN) IVPB 400 mg        400 mg 200 mL/hr over 60 Minutes Intravenous  Once 02/19/11 0554 02/19/11 0703   02/19/11 0530   vancomycin (VANCOCIN) IVPB 1000 mg/200 mL premix  Status:  Discontinued        1,000 mg 200 mL/hr over 60 Minutes Intravenous Every 12 hours 02/19/11 0523 02/19/11 0745   02/19/11 0530   fluconazole (DIFLUCAN) IVPB 800 mg  Status:  Discontinued        800 mg 400 mL/hr over 60 Minutes Intravenous Every 24 hours 02/19/11 0523 02/19/11 0553   02/17/11 1700   piperacillin-tazobactam (ZOSYN) IVPB 3.375 g  Status:  Discontinued        3.375 g 12.5 mL/hr over 240 Minutes Intravenous Every 8 hours 02/17/11 1442 02/19/11 0556          Assessment/Plan: s/p Procedure(s):  EXPLORATORY LAPAROTOMY, Gastrorrhaphty : Impression: 1. Worsening renal failure 2. Anemia secondary to acute blood loss from art line. 3. VDRF- stable 4. MOF secondary to peritonitis. Plan:  Have discussed situation with [facility]. They have accepted patient in transfer for CAVHD.  General surgery has been notified of what I did for him. He has been notified that he is being transferred for further care that we cannot provide here in any [facility]. They are fine with that and agreed to the  transfer. I will be more than happy to accept the patient back on transfer once he has CAVHD.      LOS: 3 days    Ilana Prezioso A 02/20/2011

## 2011-02-20 NOTE — Consult Note (Signed)
ANTIBIOTIC CONSULT NOTE   Pharmacy Consult for vancomycin and fluconazole Indication: possible sepsis, pna  Allergies  Allergen Reactions  . Eggs Or Egg-Derived Products    Patient Measurements: Height: 5\' 6"  (167.6 cm) Weight: 205 lb 4 oz (93.1 kg) IBW/kg (Calculated) : 63.8   Vital Signs: Temp: 99 F (37.2 C) (10/13 1005) Temp src: Axillary (10/13 1005) BP: 104/84 mmHg (10/13 1000) Pulse Rate: 101  (10/13 1005) Intake/Output from previous day: 10/12 0701 - 10/13 0700 In: 4588.9 [I.V.:4038.9; IV Piggyback:550] Out: 606 [Urine:100; Drains:505; Stool:1] Intake/Output from this shift: Total I/O In: 857.8 [I.V.:384.5; Blood:273.3; IV Piggyback:200] Out: 90 [Drains:90]  Labs:  Basename 02/20/11 0443 02/19/11 0430 02/18/11 2318 02/18/11 1843  WBC 10.5 10.7* -- 9.4  HGB 7.4* 9.8* -- 11.2*  PLT 278 520* -- 506*  LABCREA -- -- -- --  CREATININE 5.81* 3.88* 3.31* --   Estimated Creatinine Clearance: 13.4 ml/min (by C-G formula based on Cr of 5.81). No results found for this basename: VANCOTROUGH:2,VANCOPEAK:2,VANCORANDOM:2,GENTTROUGH:2,GENTPEAK:2,GENTRANDOM:2,TOBRATROUGH:2,TOBRAPEAK:2,TOBRARND:2,AMIKACINPEAK:2,AMIKACINTROU:2,AMIKACIN:2, in the last 72 hours   Microbiology: Recent Results (from the past 720 hour(s))  MRSA PCR SCREENING     Status: Normal   Collection Time   02/17/11  7:17 PM      Component Value Range Status Comment   MRSA by PCR NEGATIVE  NEGATIVE  Final   SURGICAL PCR SCREEN     Status: Abnormal   Collection Time   02/18/11  1:59 AM      Component Value Range Status Comment   MRSA, PCR NEGATIVE  NEGATIVE  Final    Staphylococcus aureus POSITIVE (*) NEGATIVE  Final   CULTURE, BLOOD (ROUTINE X 2)     Status: Normal (Preliminary result)   Collection Time   02/19/11  5:42 AM      Component Value Range Status Comment   Specimen Description BLOOD BLOOD LEFT HAND   Final    Special Requests AEB 2CC   Final    Culture PENDING   Incomplete    Report Status  PENDING   Incomplete   CULTURE, BLOOD (ROUTINE X 2)     Status: Normal (Preliminary result)   Collection Time   02/19/11  5:43 AM      Component Value Range Status Comment   Specimen Description BLOOD BLOOD LEFT ARM   Final    Special Requests BAA [facility] EACH   Final    Culture PENDING   Incomplete    Report Status PENDING   Incomplete    Medical History: Past Medical History  Diagnosis Date  . Depression    Medications:  Scheduled:     . sodium chloride   Intravenous Once  . acetaminophen  1,000 mg Intravenous Q6H  . albumin human  25 g Intravenous BID  . antiseptic oral rinse   Mouth Rinse QID  . chlorhexidine  15 mL Mouth/Throat BID  . Chlorhexidine Gluconate Cloth  6 each Topical Q0600  . enoxaparin  30 mg Subcutaneous Q24H  . fluconazole (DIFLUCAN) IV  200 mg Intravenous Q24H  . furosemide  200 mg Intravenous BID  . furosemide  200 mg Intravenous Once  . mupirocin   Topical BID  . pantoprazole (PROTONIX) IV  40 mg Intravenous Q24H  . piperacillin-tazobactam (ZOSYN)  IV  2.25 g Intravenous Q8H  . propofol      . sodium chloride  10 mL Intracatheter Q12H  . sodium chloride      . vancomycin  1,000 mg Intravenous Q48H  . DISCONTD: vancomycin  1,000 mg Intravenous Q24H   Assessment: Worsening renal failure Plan transfer to [facility] per MD clcr ~ 13 ml/min  SCr rising Multi-organ failure  Goal of Therapy:  Vancomycin trough level 15-20 mcg/ml Eradicate infection.  Plan: Fluconazole 400mg  already given...continue Fluconazole 200mg  iv q24hrs thereafter for now Change Vancomycin to 1gm iv q48 hrs.  Will adjust ABX as needed based on nephrology consult and/or need for dialysis and/or drug levels Monitor renal fxn per protocol   Valrie Hart A 02/20/2011,10:19 AM

## 2011-02-20 NOTE — Progress Notes (Signed)
MD notified of [facility] request for cont sedation for transport and increased HR. MD ordered propofol. Pt has allergy to eggs and egg product. MD notified of contraindication. Pt has no known history with Propofol. MD wishes to continue with order for propofol. [facility] to start propofol gtt.

## 2011-02-20 NOTE — Progress Notes (Signed)
Dr. Lovell Sheehan notified of HR consistently staying 150-170's. Blood rate reduced to 50 mL/hr. Levophed decreased to 4 mcg/min. Pain and sedation medication given. MD is on his way to see patient.

## 2011-02-20 NOTE — Progress Notes (Signed)
Subjective: He remains intubated and on the ventilator. He continues to have problems with what looks like multisystem failure. His renal function is worse and he is much more anemic.  Objective: Vital signs in last 24 hours: Temp:  [99.4 F (37.4 C)-102 F (38.9 C)] 100.3 F (37.9 C) (10/13 0820) Pulse Rate:  [50-165] 50  (10/13 0900) Resp:  [0-40] 5  (10/13 0900) BP: (73-128)/(43-77) 110/68 mmHg (10/13 0900) SpO2:  [83 %-100 %] 97 % (10/13 0900) Arterial Line BP: (119-134)/(53-66) 134/66 mmHg (10/13 0745) FiO2 (%):  [29.7 %-30.3 %] 30 % (10/13 0900) Weight change:  Last BM Date: 02/20/11  Intake/Output from previous day: 10/12 0701 - 10/13 0700 In: 4588.9 [I.V.:4038.9; IV Piggyback:550] Out: 606 [Urine:100; Drains:505; Stool:1]  PHYSICAL EXAM General appearance: Intubated and sedated Resp: rales bibasilar and rhonchi bilaterally Cardio: regular rate and rhythm, S1, S2 normal, no murmur, click, rub or gallop GI: soft, non-tender; bowel sounds normal; no masses,  no organomegaly Extremities: extremities normal, atraumatic, no cyanosis or edema  Lab Results:    Basic Metabolic Panel:  Basename 02/20/11 0443 02/19/11 0430  NA 139 138  K 3.6 4.1  CL 103 111  CO2 21 16*  GLUCOSE 88 123*  BUN 59* 51*  CREATININE 5.81* 3.88*  CALCIUM 5.9* 6.3*  MG 2.3 1.9  PHOS 5.8* 5.4*   Liver Function Tests:  Basename 02/20/11 0443 02/18/11 1843  AST 11 20  ALT <5 5  ALKPHOS 55 54  BILITOT 0.2* 0.2*  PROT 4.1* 4.0*  ALBUMIN 1.6* 1.5*   No results found for this basename: LIPASE:2,AMYLASE:2 in the last 72 hours No results found for this basename: AMMONIA:2 in the last 72 hours CBC:  Basename 02/20/11 0443 02/19/11 0430 02/18/11 1843 02/17/11 2214  WBC 10.5 10.7* -- --  NEUTROABS -- -- 7.6 9.4*  HGB 7.4* 9.8* -- --  HCT 22.9* 30.6* -- --  MCV 89.5 90.5 -- --  PLT 278 520* -- --   Cardiac Enzymes: No results found for this basename:  CKTOTAL:3,CKMB:3,CKMBINDEX:3,TROPONINI:3 in the last 72 hours BNP: No results found for this basename: POCBNP:3 in the last 72 hours D-Dimer: No results found for this basename: DDIMER:2 in the last 72 hours CBG: No results found for this basename: GLUCAP:6 in the last 72 hours Hemoglobin A1C: No results found for this basename: HGBA1C in the last 72 hours Fasting Lipid Panel: No results found for this basename: CHOL,HDL,LDLCALC,TRIG,CHOLHDL,LDLDIRECT in the last 72 hours Thyroid Function Tests: No results found for this basename: TSH,T4TOTAL,FREET4,T3FREE,THYROIDAB in the last 72 hours Anemia Panel: No results found for this basename: VITAMINB12,FOLATE,FERRITIN,TIBC,IRON,RETICCTPCT in the last 72 hours Urine Drug Screen:  Alcohol Level: No results found for this basename: ETH:2 in the last 72 hours Urinalysis:  Misc. Labs:  ABGS  Basename 02/19/11 0415  PHART 7.359  PO2ART 90.2  TCO2 13.7  HCO3 15.0*   CULTURES Recent Results (from the past 240 hour(s))  MRSA PCR SCREENING     Status: Normal   Collection Time   02/17/11  7:17 PM      Component Value Range Status Comment   MRSA by PCR NEGATIVE  NEGATIVE  Final   SURGICAL PCR SCREEN     Status: Abnormal   Collection Time   02/18/11  1:59 AM      Component Value Range Status Comment   MRSA, PCR NEGATIVE  NEGATIVE  Final    Staphylococcus aureus POSITIVE (*) NEGATIVE  Final   CULTURE, BLOOD (ROUTINE X 2)  Status: Normal (Preliminary result)   Collection Time   02/19/11  5:42 AM      Component Value Range Status Comment   Specimen Description BLOOD BLOOD LEFT HAND   Final    Special Requests AEB 2CC   Final    Culture PENDING   Incomplete    Report Status PENDING   Incomplete   CULTURE, BLOOD (ROUTINE X 2)     Status: Normal (Preliminary result)   Collection Time   02/19/11  5:43 AM      Component Value Range Status Comment   Specimen Description BLOOD BLOOD LEFT ARM   Final    Special Requests BAA [facility] EACH    Final    Culture PENDING   Incomplete    Report Status PENDING   Incomplete    Studies/Results: Dg Chest Portable 1 View  02/19/2011  *RADIOLOGY REPORT*  Clinical Data: Respiratory failure  PORTABLE CHEST - 1 VIEW  Comparison:   the previous day's study  Findings: Endotracheal tube, nasogastric tube, and left subclavian central line are stable in position.  Low lung volumes.  Persistent bibasilar atelectasis or consolidation.  Suspect small left effusion.  Heart size within normal limits for technique.  IMPRESSION:  1.  Persistent low lung volumes with bibasilar atelectasis/consolidation. 2. Support hardware stable in position.  Original Report Authenticated By: Osa Craver, M.D.   Dg Chest Portable 1 View  02/18/2011  *RADIOLOGY REPORT*  Clinical Data: Status post intubation for respiratory failure.  PORTABLE CHEST - 1 VIEW  Comparison: 02/17/2011  Findings: Endotracheal tube present with tip approximately 3 cm above the carina.  Lung volumes are extremely low bilaterally with bibasilar atelectasis present.  No evidence of pneumothorax, edema or pleural effusion.  A nasogastric tube extends into the stomach. Central line is in stable position.  Surgical drain visible in the upper abdomen.  IMPRESSION: Endotracheal tube tip approximately 3 cm above the carina.  Lungs show low volumes with bibasilar atelectasis.  Original Report Authenticated By: Reola Calkins, M.D.    Medications:  Scheduled:   . sodium chloride   Intravenous Once  . acetaminophen  1,000 mg Intravenous Q6H  . albumin human  25 g Intravenous BID  . antiseptic oral rinse   Mouth Rinse QID  . chlorhexidine  15 mL Mouth/Throat BID  . Chlorhexidine Gluconate Cloth  6 each Topical Q0600  . enoxaparin  30 mg Subcutaneous Q24H  . fluconazole (DIFLUCAN) IV  200 mg Intravenous Q24H  . furosemide  200 mg Intravenous BID  . furosemide  200 mg Intravenous Once  . mupirocin   Topical BID  . pantoprazole (PROTONIX) IV  40  mg Intravenous Q24H  . piperacillin-tazobactam (ZOSYN)  IV  2.25 g Intravenous Q8H  . sodium chloride  10 mL Intracatheter Q12H  . sodium chloride      . vancomycin  1,000 mg Intravenous Q24H  . DISCONTD: enoxaparin  40 mg Subcutaneous Q24H   Continuous:   . DOPamine Stopped (02/18/11 2235)  . norepinephrine (LEVOPHED) Adult infusion Stopped (02/20/11 0900)  . sodium chloride 0.45 % 1,000 mL with sodium bicarbonate 1 mEq/mL 100 mEq infusion 20 mL/hr at 02/20/11 9147   WGN:FAOZHYQMVHQIO (TYLENOL) oral liquid 160 mg/5 mL, fentaNYL, HYDROmorphone, levalbuterol, midazolam, ondansetron, sodium chloride  Assesment: He has respiratory failure he's had surgery for peptic ulcer he has multisystem failure now he is markedly anemic Active Problems:  * No active hospital problems. *     Plan: He is apparently  going to be transferred to [facility] to be at a higher level of care    LOS: 3 days   Kendrah Lovern L 02/20/2011, 9:02 AM

## 2011-02-20 NOTE — Progress Notes (Signed)
Report called to Maylon Peppers, RN on dept 2100. Pt transported via [facility]. Pt's family notified of pt's departure and belongings left at desk.

## 2011-02-20 NOTE — Progress Notes (Signed)
Subjective: Interval History:  Patient her intubated awake difficult to get any history. Objective: Vital signs in last 24 hours: Temp:  [99.4 F (37.4 C)-102 F (38.9 C)] 100.2 F (37.9 C) (10/13 0745) Pulse Rate:  [91-109] 92  (10/13 0700) Resp:  [0-40] 23  (10/13 0700) BP: (73-128)/(43-84) 126/61 mmHg (10/13 0700) SpO2:  [83 %-100 %] 100 % (10/13 0630) Arterial Line BP: (99)/(73) 99/73 mmHg (10/12 0830) FiO2 (%):  [29.7 %-30.3 %] 30.1 % (10/13 0630) Weight change:   Intake/Output from previous day: 10/12 0701 - 10/13 0700 In: 2487.9 [I.V.:1937.9; IV Piggyback:550] Out: 606 [Urine:100; Drains:505; Stool:1] Intake/Output this shift: Total I/O In: -  Out: 90 [Drains:90]  General appearance: alert and no distress Resp: diminished breath sounds bibasilar and rhonchi bilaterally Cardio: regular rate and rhythm and Him he him patient seems to be tachycardic in sinus rhythm her. Her patient presently is awake and. GI: Hypoactive. Extremities: edema Uterus 1+ edema at the back of his back. he him he him to  Lab Results:  Basename 02/20/11 0443 02/19/11 0430  WBC 10.5 10.7*  HGB 7.4* 9.8*  HCT 22.9* 30.6*  PLT 278 520*   BMET:  Basename 02/20/11 0443 02/19/11 0430  NA 139 138  K 3.6 4.1  CL 103 111  CO2 21 16*  GLUCOSE 88 123*  BUN 59* 51*  CREATININE 5.81* 3.88*  CALCIUM 5.9* 6.3*   No results found for this basename: PTH:2 in the last 72 hours Iron Studies: No results found for this basename: IRON,TIBC,TRANSFERRIN,FERRITIN in the last 72 hours  Studies/Results: Dg Chest Portable 1 View  02/19/2011  *RADIOLOGY REPORT*  Clinical Data: Respiratory failure  PORTABLE CHEST - 1 VIEW  Comparison:   the previous day's study  Findings: Endotracheal tube, nasogastric tube, and left subclavian central line are stable in position.  Low lung volumes.  Persistent bibasilar atelectasis or consolidation.  Suspect small left effusion.  Heart size within normal limits for technique.   IMPRESSION:  1.  Persistent low lung volumes with bibasilar atelectasis/consolidation. 2. Support hardware stable in position.  Original Report Authenticated By: Osa Craver, M.D.   Dg Chest Portable 1 View  02/18/2011  *RADIOLOGY REPORT*  Clinical Data: Status post intubation for respiratory failure.  PORTABLE CHEST - 1 VIEW  Comparison: 02/17/2011  Findings: Endotracheal tube present with tip approximately 3 cm above the carina.  Lung volumes are extremely low bilaterally with bibasilar atelectasis present.  No evidence of pneumothorax, edema or pleural effusion.  A nasogastric tube extends into the stomach. Central line is in stable position.  Surgical drain visible in the upper abdomen.  IMPRESSION: Endotracheal tube tip approximately 3 cm above the carina.  Lungs show low volumes with bibasilar atelectasis.  Original Report Authenticated By: Reola Calkins, M.D.    I have reviewed the patient's current medications.  Assessment/Plan: Her problem #1 acute kidney injury  presently patient is oliguric and BUN and creatinine continued to increase. He is on Lasix and albumin as this moment no significant difference. Problem #2 history of hypotension  this is secondary to sepsis patient on the pressure suport his blood pressure seems to be  somewhat better her and. Her problem #3 history of metabolic acidosispatienthis CO2 has improved and.  Problem #4 hypocalcemia corrected for albumin of 1.6 her calcium is still low. Her problem #5 history of severe hypoalbuminemia Problem #6 respiratory failure patient remains intubated Her problem #7 history of anemia H&H seems to be declining  Problem #  8 sinus tachycardia, patient  this moment seems to be awake and seems tosomewhat agitated. Seems to be transient. Recommendation because of  patient renal function worsening, oliguric, and hypotensive being on pressor support very difficult to do hemodialysis . At this moment patient may benefit from  CVVHD. Hence transferring the patient to intensive care unit  may be reasonable choice. For now we'll continue his present management.    LOS: 3 days   Moe Graca S 02/20/2011,8:00 AM

## 2011-02-21 ENCOUNTER — Inpatient Hospital Stay (HOSPITAL_COMMUNITY): Payer: Non-veteran care

## 2011-02-21 LAB — RENAL FUNCTION PANEL
Calcium: 6.2 mg/dL — CL (ref 8.4–10.5)
Creatinine, Ser: 3.65 mg/dL — ABNORMAL HIGH (ref 0.50–1.35)
GFR calc Af Amer: 19 mL/min — ABNORMAL LOW (ref >90)
GFR calc non Af Amer: 16 mL/min — ABNORMAL LOW (ref >90)
Phosphorus: 5.2 mg/dL — ABNORMAL HIGH (ref 2.3–4.6)
Sodium: 139 mEq/L (ref 135–145)

## 2011-02-21 LAB — CARDIAC PANEL(CRET KIN+CKTOT+MB+TROPI)
CK, MB: 1.9 ng/mL (ref 0.3–4.0)
Relative Index: 1.1 (ref 0.0–2.5)
Total CK: 173 U/L (ref 7–232)
Troponin I: <0.3 ng/mL (ref <0.30)
Troponin I: <0.3 ng/mL (ref <0.30)

## 2011-02-21 LAB — BLOOD GAS, ARTERIAL
Acid-base deficit: 4.2 mmol/L — ABNORMAL HIGH (ref 0.0–2.0)
FIO2: 0.3 %
TCO2: 20.4 mmol/L (ref 0–100)
pCO2 arterial: 29.8 mmHg — ABNORMAL LOW (ref 35.0–45.0)
pO2, Arterial: 116 mmHg — ABNORMAL HIGH (ref 80.0–100.0)

## 2011-02-21 LAB — COMPREHENSIVE METABOLIC PANEL
Alkaline Phosphatase: 73 U/L (ref 39–117)
BUN: 58 mg/dL — ABNORMAL HIGH (ref 6–23)
CO2: 19 mEq/L (ref 19–32)
GFR calc Af Amer: 12 mL/min — ABNORMAL LOW (ref >90)
GFR calc non Af Amer: 10 mL/min — ABNORMAL LOW (ref >90)
Glucose, Bld: 109 mg/dL — ABNORMAL HIGH (ref 70–99)
Potassium: 3.7 mEq/L (ref 3.5–5.1)
Total Bilirubin: 0.3 mg/dL (ref 0.3–1.2)
Total Protein: 4.3 g/dL — ABNORMAL LOW (ref 6.0–8.3)

## 2011-02-21 LAB — CBC
HCT: 25.4 % — ABNORMAL LOW (ref 39.0–52.0)
MCV: 87.9 fL (ref 78.0–100.0)
Platelets: 190 10*3/uL (ref 150–400)
RBC: 2.89 MIL/uL — ABNORMAL LOW (ref 4.22–5.81)
WBC: 14 10*3/uL — ABNORMAL HIGH (ref 4.0–10.5)

## 2011-02-21 LAB — GLUCOSE, CAPILLARY: Glucose-Capillary: 108 mg/dL — ABNORMAL HIGH (ref 70–99)

## 2011-02-21 LAB — PROTIME-INR: Prothrombin Time: 21 seconds — ABNORMAL HIGH (ref 11.6–15.2)

## 2011-02-21 LAB — PHOSPHORUS: Phosphorus: 6.2 mg/dL — ABNORMAL HIGH (ref 2.3–4.6)

## 2011-02-22 ENCOUNTER — Inpatient Hospital Stay (HOSPITAL_COMMUNITY): Payer: Non-veteran care

## 2011-02-22 LAB — CBC
Hemoglobin: 7.8 g/dL — ABNORMAL LOW (ref 13.0–17.0)
MCH: 29.4 pg (ref 26.0–34.0)
MCV: 87.2 fL (ref 78.0–100.0)
RBC: 2.65 MIL/uL — ABNORMAL LOW (ref 4.22–5.81)
WBC: 15 10*3/uL — ABNORMAL HIGH (ref 4.0–10.5)

## 2011-02-22 LAB — RENAL FUNCTION PANEL
Albumin: 1.3 g/dL — ABNORMAL LOW (ref 3.5–5.2)
Albumin: 1.3 g/dL — ABNORMAL LOW (ref 3.5–5.2)
BUN: 45 mg/dL — ABNORMAL HIGH (ref 6–23)
Calcium: 6.7 mg/dL — ABNORMAL LOW (ref 8.4–10.5)
Chloride: 105 mEq/L (ref 96–112)
GFR calc Af Amer: 25 mL/min — ABNORMAL LOW (ref >90)
GFR calc Af Amer: 30 mL/min — ABNORMAL LOW (ref >90)
Glucose, Bld: 109 mg/dL — ABNORMAL HIGH (ref 70–99)
Phosphorus: 3.8 mg/dL (ref 2.3–4.6)
Phosphorus: 3.4 mg/dL (ref 2.3–4.6)
Potassium: 3.9 mEq/L (ref 3.5–5.1)
Potassium: 3.8 mEq/L (ref 3.5–5.1)
Sodium: 138 mEq/L (ref 135–145)
Sodium: 138 mEq/L (ref 135–145)

## 2011-02-22 LAB — GLUCOSE, CAPILLARY
Glucose-Capillary: 82 mg/dL (ref 70–99)
Glucose-Capillary: 109 mg/dL — ABNORMAL HIGH (ref 70–99)
Glucose-Capillary: 118 mg/dL — ABNORMAL HIGH (ref 70–99)
Glucose-Capillary: 103 mg/dL — ABNORMAL HIGH (ref 70–99)

## 2011-02-22 LAB — MAGNESIUM: Magnesium: 2.5 mg/dL (ref 1.5–2.5)

## 2011-02-22 LAB — H. PYLORI ANTIBODY, IGG: H Pylori IgG: <0.4 {ISR}

## 2011-02-22 LAB — PROTIME-INR: Prothrombin Time: 15.1 seconds (ref 11.6–15.2)

## 2011-02-22 LAB — CULTURE, RESPIRATORY W GRAM STAIN

## 2011-02-23 ENCOUNTER — Inpatient Hospital Stay (HOSPITAL_COMMUNITY): Payer: Non-veteran care

## 2011-02-23 DIAGNOSIS — R6521 Severe sepsis with septic shock: Secondary | ICD-10-CM

## 2011-02-23 DIAGNOSIS — A419 Sepsis, unspecified organism: Secondary | ICD-10-CM

## 2011-02-23 DIAGNOSIS — N170 Acute kidney failure with tubular necrosis: Secondary | ICD-10-CM

## 2011-02-23 DIAGNOSIS — R6520 Severe sepsis without septic shock: Secondary | ICD-10-CM

## 2011-02-23 DIAGNOSIS — J9600 Acute respiratory failure, unspecified whether with hypoxia or hypercapnia: Secondary | ICD-10-CM

## 2011-02-23 LAB — RENAL FUNCTION PANEL
Albumin: 1.4 g/dL — ABNORMAL LOW (ref 3.5–5.2)
BUN: 39 mg/dL — ABNORMAL HIGH (ref 6–23)
BUN: 39 mg/dL — ABNORMAL HIGH (ref 6–23)
CO2: 25 mEq/L (ref 19–32)
Chloride: 106 mEq/L (ref 96–112)
Chloride: 105 mEq/L (ref 96–112)
GFR calc Af Amer: 35 mL/min — ABNORMAL LOW (ref >90)
GFR calc non Af Amer: 33 mL/min — ABNORMAL LOW (ref >90)
Glucose, Bld: 109 mg/dL — ABNORMAL HIGH (ref 70–99)
Phosphorus: 2.3 mg/dL (ref 2.3–4.6)
Phosphorus: 2.1 mg/dL — ABNORMAL LOW (ref 2.3–4.6)
Potassium: 3.9 mEq/L (ref 3.5–5.1)
Potassium: 3.9 mEq/L (ref 3.5–5.1)
Sodium: 139 mEq/L (ref 135–145)
Sodium: 138 mEq/L (ref 135–145)

## 2011-02-23 LAB — TYPE AND SCREEN
ABO/RH(D): O POS
Unit division: 0
Unit division: 0

## 2011-02-23 LAB — CBC
Hemoglobin: 6.7 g/dL — CL (ref 13.0–17.0)
MCH: 30.3 pg (ref 26.0–34.0)
MCHC: 34.2 g/dL (ref 30.0–36.0)
MCHC: 34.8 g/dL (ref 30.0–36.0)
MCV: 88.6 fL (ref 78.0–100.0)
Platelets: 188 10*3/uL (ref 150–400)
RDW: 14.8 % (ref 11.5–15.5)
WBC: 23.9 10*3/uL — ABNORMAL HIGH (ref 4.0–10.5)

## 2011-02-23 LAB — GLUCOSE, CAPILLARY
Glucose-Capillary: 107 mg/dL — ABNORMAL HIGH (ref 70–99)
Glucose-Capillary: 112 mg/dL — ABNORMAL HIGH (ref 70–99)
Glucose-Capillary: 114 mg/dL — ABNORMAL HIGH (ref 70–99)
Glucose-Capillary: 116 mg/dL — ABNORMAL HIGH (ref 70–99)
Glucose-Capillary: 96 mg/dL (ref 70–99)

## 2011-02-23 LAB — PROTIME-INR: Prothrombin Time: 14.4 seconds (ref 11.6–15.2)

## 2011-02-23 LAB — PREPARE RBC (CROSSMATCH)

## 2011-02-23 LAB — VANCOMYCIN, RANDOM: Vancomycin Rm: 34.6 ug/mL

## 2011-02-24 ENCOUNTER — Inpatient Hospital Stay (HOSPITAL_COMMUNITY): Payer: Non-veteran care

## 2011-02-24 DIAGNOSIS — R6521 Severe sepsis with septic shock: Secondary | ICD-10-CM

## 2011-02-24 DIAGNOSIS — N170 Acute kidney failure with tubular necrosis: Secondary | ICD-10-CM

## 2011-02-24 DIAGNOSIS — K659 Peritonitis, unspecified: Secondary | ICD-10-CM

## 2011-02-24 DIAGNOSIS — A419 Sepsis, unspecified organism: Secondary | ICD-10-CM

## 2011-02-24 DIAGNOSIS — J9600 Acute respiratory failure, unspecified whether with hypoxia or hypercapnia: Secondary | ICD-10-CM

## 2011-02-24 LAB — CULTURE, BLOOD (ROUTINE X 2): Culture: NO GROWTH

## 2011-02-24 LAB — RENAL FUNCTION PANEL
Albumin: 1.5 g/dL — ABNORMAL LOW (ref 3.5–5.2)
BUN: 43 mg/dL — ABNORMAL HIGH (ref 6–23)
Calcium: 7.6 mg/dL — ABNORMAL LOW (ref 8.4–10.5)
Creatinine, Ser: 2 mg/dL — ABNORMAL HIGH (ref 0.50–1.35)
Glucose, Bld: 105 mg/dL — ABNORMAL HIGH (ref 70–99)
Phosphorus: 2.4 mg/dL (ref 2.3–4.6)

## 2011-02-24 LAB — CBC
HCT: 24.2 % — ABNORMAL LOW (ref 39.0–52.0)
Hemoglobin: 8.3 g/dL — ABNORMAL LOW (ref 13.0–17.0)
MCHC: 34.3 g/dL (ref 30.0–36.0)
MCV: 87.4 fL (ref 78.0–100.0)
RDW: 14.8 % (ref 11.5–15.5)

## 2011-02-24 LAB — GLUCOSE, CAPILLARY
Glucose-Capillary: 110 mg/dL — ABNORMAL HIGH (ref 70–99)
Glucose-Capillary: 102 mg/dL — ABNORMAL HIGH (ref 70–99)
Glucose-Capillary: 100 mg/dL — ABNORMAL HIGH (ref 70–99)
Glucose-Capillary: 98 mg/dL (ref 70–99)

## 2011-02-24 LAB — MAGNESIUM: Magnesium: 2.7 mg/dL — ABNORMAL HIGH (ref 1.5–2.5)

## 2011-02-24 LAB — PROTIME-INR: INR: 1.04 (ref 0.00–1.49)

## 2011-02-24 NOTE — Consult Note (Signed)
  NAMEGAGANDEEP, PETTET                ACCOUNT NO.:  [id number]  MEDICAL RECORD NO.:  [id number]  LOCATION:                                 FACILITY:  PHYSICIAN:  Corie Vavra A. Arrington Bencomo, M.D.DATE OF BIRTH:  1945-11-17  DATE OF CONSULTATION: DATE OF DISCHARGE:                                CONSULTATION   REQUESTING PHYSICIAN:  Critical Care Medicine.  REASON FOR CONSULTATION:  Management of postoperative wound.  HISTORY OF PRESENT ILLNESS:  The patient is a 65 year old male who is 2 days out from an exploratory laparotomy with closure of duodenal ulcer by Dr. Lovell Sheehan at [facility].  Postoperatively, he went into acute renal failure and multisystem organ failure and required transfer for dialysis.  Dr. Lovell Sheehan contacted me to let me know that the patient was coming, but he was being admitted to the critical care folks.  We were asked to follow.  We were asked to help manage postoperative surgical issues as need be.  The patient is currently intubated and sedated, therefore I can get no further history from at this point in time.  PAST MEDICAL HISTORY:  Per chart, obesity.  PAST SURGICAL HISTORY:  No previous abdominal surgery.  MEDICATIONS:  Unknown.  ALLERGIES:  Unknown.  REVIEW OF SYSTEMS:  Unobtainable due the patient's mental status.  FAMILY HISTORY:  Noncontributory.  PHYSICAL EXAMINATION:  VITAL SIGNS:  Currently, he has a heart rate of 150, blood pressure is 100 systolic.  He is on the ventilator and intubated, sedated. ABDOMEN:  Midline incision well closed.  No redness.  JP drain in the upper quadrant has bilious drainage and was about 30 mL in the vault currently.  Distended. EXTREMITIES:  1+ edema noted bilaterally.  Warm and well perfused. PULMONARY:  Lung sounds are clear.  Chest wall excursion normal. CARDIOVASCULAR:  Tachycardia. SKIN:  No obvious breakdown.  IMPRESSION:  Status post exploratory laparotomy with closure of duodenal ulcer with postop  multisystem organ failure.  PLAN:  We have nothing different to add at this point.  We will monitor his drain output.  It is bilious, therefore he could have a leak at his closure site, but this could be old bile from irrigation.  In any event, we will follow closely at this point.  Thank you for this consultation.     Vishaal Strollo A. Mishti Swanton, M.D.     TAC/MEDQ  D:  02/20/2011  T:  02/20/2011  Job:  [id number]  Electronically Signed by Harriette Bouillon M.D. on 02/24/2011 09:14:45 AM

## 2011-02-25 ENCOUNTER — Inpatient Hospital Stay (HOSPITAL_COMMUNITY): Payer: Non-veteran care

## 2011-02-25 ENCOUNTER — Encounter (HOSPITAL_COMMUNITY): Payer: Self-pay | Admitting: General Surgery

## 2011-02-25 DIAGNOSIS — K251 Acute gastric ulcer with perforation: Secondary | ICD-10-CM

## 2011-02-25 LAB — POCT I-STAT 7, (LYTES, BLD GAS, ICA,H+H)
Acid-base deficit: 7 mmol/L — ABNORMAL HIGH (ref 0.0–2.0)
Bicarbonate: 22.6 mEq/L (ref 20.0–24.0)
Calcium, Ion: 0.96 mmol/L — ABNORMAL LOW (ref 1.12–1.32)
Hemoglobin: 9.9 g/dL — ABNORMAL LOW (ref 13.0–17.0)
O2 Saturation: 100 %
Sodium: 138 mEq/L (ref 135–145)
TCO2: 24 mmol/L (ref 0–100)
pCO2 arterial: 35.7 mmHg (ref 35.0–45.0)
pH, Arterial: 7.408 (ref 7.350–7.450)
pO2, Arterial: 288 mmHg — ABNORMAL HIGH (ref 80.0–100.0)
pO2, Arterial: 263 mmHg — ABNORMAL HIGH (ref 80.0–100.0)

## 2011-02-25 LAB — BASIC METABOLIC PANEL
CO2: 17 mEq/L — ABNORMAL LOW (ref 19–32)
CO2: 17 mEq/L — ABNORMAL LOW (ref 19–32)
Chloride: 113 mEq/L — ABNORMAL HIGH (ref 96–112)
Chloride: 105 mEq/L (ref 96–112)
Creatinine, Ser: 3.19 mg/dL — ABNORMAL HIGH (ref 0.50–1.35)
GFR calc Af Amer: 15 mL/min — ABNORMAL LOW (ref >90)
GFR calc Af Amer: 14 mL/min — ABNORMAL LOW (ref >90)
GFR calc non Af Amer: 13 mL/min — ABNORMAL LOW (ref >90)
Glucose, Bld: 87 mg/dL (ref 70–99)
Potassium: 4 mEq/L (ref 3.5–5.1)
Sodium: 139 mEq/L (ref 135–145)
Sodium: 139 mEq/L (ref 135–145)

## 2011-02-25 LAB — POCT I-STAT 3, ART BLOOD GAS (G3+)
Bicarbonate: 17.7 mEq/L — ABNORMAL LOW (ref 20.0–24.0)
pCO2 arterial: 27.8 mmHg — ABNORMAL LOW (ref 35.0–45.0)
pH, Arterial: 7.413 (ref 7.350–7.450)
pO2, Arterial: 105 mmHg — ABNORMAL HIGH (ref 80.0–100.0)

## 2011-02-25 LAB — CBC
HCT: 17.9 % — ABNORMAL LOW (ref 39.0–52.0)
Hemoglobin: 6.1 g/dL — CL (ref 13.0–17.0)
Hemoglobin: 7.3 g/dL — ABNORMAL LOW (ref 13.0–17.0)
MCH: 30.3 pg (ref 26.0–34.0)
MCH: 30 pg (ref 26.0–34.0)
MCV: 89.1 fL (ref 78.0–100.0)
Platelets: 312 10*3/uL (ref 150–400)
RBC: 2.01 MIL/uL — ABNORMAL LOW (ref 4.22–5.81)
RBC: 2.43 MIL/uL — ABNORMAL LOW (ref 4.22–5.81)
RBC: 3.27 MIL/uL — ABNORMAL LOW (ref 4.22–5.81)
WBC: 38.3 10*3/uL — ABNORMAL HIGH (ref 4.0–10.5)

## 2011-02-25 LAB — GLUCOSE, CAPILLARY
Glucose-Capillary: 102 mg/dL — ABNORMAL HIGH (ref 70–99)
Glucose-Capillary: 114 mg/dL — ABNORMAL HIGH (ref 70–99)
Glucose-Capillary: 187 mg/dL — ABNORMAL HIGH (ref 70–99)

## 2011-02-25 LAB — PROTIME-INR
INR: 1.37 (ref 0.00–1.49)
Prothrombin Time: 17.1 seconds — ABNORMAL HIGH (ref 11.6–15.2)

## 2011-02-25 LAB — PREPARE RBC (CROSSMATCH)

## 2011-02-26 ENCOUNTER — Inpatient Hospital Stay (HOSPITAL_COMMUNITY): Payer: Non-veteran care

## 2011-02-26 LAB — RENAL FUNCTION PANEL
Albumin: 1.2 g/dL — ABNORMAL LOW (ref 3.5–5.2)
CO2: 14 mEq/L — ABNORMAL LOW (ref 19–32)
Calcium: 6.5 mg/dL — ABNORMAL LOW (ref 8.4–10.5)
Chloride: 105 mEq/L (ref 96–112)
GFR calc Af Amer: 15 mL/min — ABNORMAL LOW (ref >90)
GFR calc non Af Amer: 13 mL/min — ABNORMAL LOW (ref >90)
Sodium: 136 mEq/L (ref 135–145)

## 2011-02-26 LAB — CBC
HCT: 30.3 % — ABNORMAL LOW (ref 39.0–52.0)
Hemoglobin: 9.1 g/dL — ABNORMAL LOW (ref 13.0–17.0)
MCH: 30.3 pg (ref 26.0–34.0)
MCH: 29.9 pg (ref 26.0–34.0)
MCH: 29.5 pg (ref 26.0–34.0)
MCHC: 35 g/dL (ref 30.0–36.0)
MCHC: 34.1 g/dL (ref 30.0–36.0)
MCHC: 34.3 g/dL (ref 30.0–36.0)
MCV: 86.7 fL (ref 78.0–100.0)
Platelets: 314 10*3/uL (ref 150–400)
RBC: 2.34 MIL/uL — ABNORMAL LOW (ref 4.22–5.81)
RDW: 15.6 % — ABNORMAL HIGH (ref 11.5–15.5)

## 2011-02-26 LAB — BASIC METABOLIC PANEL
BUN: 89 mg/dL — ABNORMAL HIGH (ref 6–23)
CO2: 15 mEq/L — ABNORMAL LOW (ref 19–32)
Calcium: 6.3 mg/dL — CL (ref 8.4–10.5)
Creatinine, Ser: 4.68 mg/dL — ABNORMAL HIGH (ref 0.50–1.35)
GFR calc non Af Amer: 12 mL/min — ABNORMAL LOW (ref >90)
Glucose, Bld: 155 mg/dL — ABNORMAL HIGH (ref 70–99)
Sodium: 141 mEq/L (ref 135–145)

## 2011-02-26 LAB — CROSSMATCH
Unit division: 0
Unit division: 0
Unit division: 0
Unit division: 0
Unit division: 0
Unit division: 0

## 2011-02-26 LAB — POCT I-STAT 3, ART BLOOD GAS (G3+)
Acid-base deficit: 9 mmol/L — ABNORMAL HIGH (ref 0.0–2.0)
Bicarbonate: 12.6 mEq/L — ABNORMAL LOW (ref 20.0–24.0)
O2 Saturation: 99 %
O2 Saturation: 98 %
TCO2: 16 mmol/L (ref 0–100)
TCO2: 13 mmol/L (ref 0–100)
pCO2 arterial: 26.7 mmHg — ABNORMAL LOW (ref 35.0–45.0)
pH, Arterial: 7.291 — ABNORMAL LOW (ref 7.350–7.450)
pO2, Arterial: 116 mmHg — ABNORMAL HIGH (ref 80.0–100.0)
pO2, Arterial: 123 mmHg — ABNORMAL HIGH (ref 80.0–100.0)

## 2011-02-26 LAB — GLUCOSE, CAPILLARY
Glucose-Capillary: 132 mg/dL — ABNORMAL HIGH (ref 70–99)
Glucose-Capillary: 151 mg/dL — ABNORMAL HIGH (ref 70–99)
Glucose-Capillary: 153 mg/dL — ABNORMAL HIGH (ref 70–99)

## 2011-02-26 LAB — CARBOXYHEMOGLOBIN
Carboxyhemoglobin: 1.1 % (ref 0.5–1.5)
Carboxyhemoglobin: 1.1 % (ref 0.5–1.5)
Methemoglobin: 1.1 % (ref 0.0–1.5)
O2 Saturation: 52.4 %
O2 Saturation: 59.1 %
O2 Saturation: 41.9 %
Total hemoglobin: 8.9 g/dL — ABNORMAL LOW (ref 13.5–18.0)
Total hemoglobin: 10.7 g/dL — ABNORMAL LOW (ref 13.5–18.0)
Total hemoglobin: 7.1 g/dL — ABNORMAL LOW (ref 13.5–18.0)

## 2011-02-27 ENCOUNTER — Inpatient Hospital Stay (HOSPITAL_COMMUNITY): Payer: Non-veteran care

## 2011-02-27 LAB — DIFFERENTIAL
Basophils Absolute: 0.4 10*3/uL — ABNORMAL HIGH (ref 0.0–0.1)
Eosinophils Relative: 0 % (ref 0–5)
Lymphocytes Relative: 4 % — ABNORMAL LOW (ref 12–46)
Lymphs Abs: 1.5 10*3/uL (ref 0.7–4.0)
Monocytes Relative: 7 % (ref 3–12)

## 2011-02-27 LAB — CBC
Hemoglobin: 9.2 g/dL — ABNORMAL LOW (ref 13.0–17.0)
MCH: 29.5 pg (ref 26.0–34.0)
Platelets: 305 10*3/uL (ref 150–400)
RBC: 3.12 MIL/uL — ABNORMAL LOW (ref 4.22–5.81)
WBC: 36.9 10*3/uL — ABNORMAL HIGH (ref 4.0–10.5)

## 2011-02-27 LAB — RENAL FUNCTION PANEL
Albumin: 1.1 g/dL — ABNORMAL LOW (ref 3.5–5.2)
BUN: 42 mg/dL — ABNORMAL HIGH (ref 6–23)
Calcium: 7.7 mg/dL — ABNORMAL LOW (ref 8.4–10.5)
Creatinine, Ser: 2.32 mg/dL — ABNORMAL HIGH (ref 0.50–1.35)
GFR calc non Af Amer: 28 mL/min — ABNORMAL LOW (ref >90)
Phosphorus: 3.3 mg/dL (ref 2.3–4.6)

## 2011-02-27 LAB — POCT I-STAT 3, ART BLOOD GAS (G3+)
Acid-base deficit: 3 mmol/L — ABNORMAL HIGH (ref 0.0–2.0)
Bicarbonate: 23.2 mEq/L (ref 20.0–24.0)
O2 Saturation: 100 %
TCO2: 22 mmol/L (ref 0–100)
pCO2 arterial: 33.8 mmHg — ABNORMAL LOW (ref 35.0–45.0)
pCO2 arterial: 42.7 mmHg (ref 35.0–45.0)
pH, Arterial: 7.382 (ref 7.350–7.450)
pO2, Arterial: 165 mmHg — ABNORMAL HIGH (ref 80.0–100.0)
pO2, Arterial: 165 mmHg — ABNORMAL HIGH (ref 80.0–100.0)

## 2011-02-27 LAB — COMPREHENSIVE METABOLIC PANEL
ALT: 8 U/L (ref 0–53)
AST: 29 U/L (ref 0–37)
Albumin: 0.9 g/dL — ABNORMAL LOW (ref 3.5–5.2)
Alkaline Phosphatase: 49 U/L (ref 39–117)
CO2: 16 mEq/L — ABNORMAL LOW (ref 19–32)
Chloride: 111 mEq/L (ref 96–112)
GFR calc non Af Amer: 26 mL/min — ABNORMAL LOW (ref >90)
Potassium: 3.7 mEq/L (ref 3.5–5.1)
Sodium: 138 mEq/L (ref 135–145)
Total Bilirubin: 0.5 mg/dL (ref 0.3–1.2)

## 2011-02-27 LAB — GLUCOSE, CAPILLARY: Glucose-Capillary: 122 mg/dL — ABNORMAL HIGH (ref 70–99)

## 2011-02-27 LAB — MAGNESIUM: Magnesium: 1.8 mg/dL (ref 1.5–2.5)

## 2011-02-27 LAB — LACTIC ACID, PLASMA: Lactic Acid, Venous: 2.3 mmol/L — ABNORMAL HIGH (ref 0.5–2.2)

## 2011-02-28 ENCOUNTER — Inpatient Hospital Stay (HOSPITAL_COMMUNITY): Payer: Non-veteran care

## 2011-02-28 DIAGNOSIS — A419 Sepsis, unspecified organism: Secondary | ICD-10-CM

## 2011-02-28 DIAGNOSIS — J9600 Acute respiratory failure, unspecified whether with hypoxia or hypercapnia: Secondary | ICD-10-CM

## 2011-02-28 DIAGNOSIS — N170 Acute kidney failure with tubular necrosis: Secondary | ICD-10-CM

## 2011-02-28 DIAGNOSIS — K659 Peritonitis, unspecified: Secondary | ICD-10-CM

## 2011-02-28 DIAGNOSIS — R6521 Severe sepsis with septic shock: Secondary | ICD-10-CM

## 2011-02-28 LAB — CBC
HCT: 18.3 % — ABNORMAL LOW (ref 39.0–52.0)
Hemoglobin: 6.5 g/dL — CL (ref 13.0–17.0)
MCV: 85.1 fL (ref 78.0–100.0)
Platelets: 209 10*3/uL (ref 150–400)
RBC: 2.15 MIL/uL — ABNORMAL LOW (ref 4.22–5.81)
WBC: 25.2 10*3/uL — ABNORMAL HIGH (ref 4.0–10.5)

## 2011-02-28 LAB — RENAL FUNCTION PANEL
Albumin: 1.2 g/dL — ABNORMAL LOW (ref 3.5–5.2)
BUN: 38 mg/dL — ABNORMAL HIGH (ref 6–23)
CO2: 25 mEq/L (ref 19–32)
Calcium: 8.3 mg/dL — ABNORMAL LOW (ref 8.4–10.5)
Chloride: 101 mEq/L (ref 96–112)
Creatinine, Ser: 1.74 mg/dL — ABNORMAL HIGH (ref 0.50–1.35)
GFR calc non Af Amer: 32 mL/min — ABNORMAL LOW (ref >90)
Glucose, Bld: 120 mg/dL — ABNORMAL HIGH (ref 70–99)
Potassium: 4.1 mEq/L (ref 3.5–5.1)

## 2011-02-28 LAB — PHOSPHORUS: Phosphorus: 2.5 mg/dL (ref 2.3–4.6)

## 2011-02-28 LAB — HEMOGLOBIN AND HEMATOCRIT, BLOOD
HCT: 25.1 % — ABNORMAL LOW (ref 39.0–52.0)
Hemoglobin: 8.7 g/dL — ABNORMAL LOW (ref 13.0–17.0)

## 2011-02-28 LAB — MAGNESIUM: Magnesium: 2.5 mg/dL (ref 1.5–2.5)

## 2011-02-28 LAB — BASIC METABOLIC PANEL
CO2: 23 mEq/L (ref 19–32)
Chloride: 101 mEq/L (ref 96–112)
Creatinine, Ser: 2.06 mg/dL — ABNORMAL HIGH (ref 0.50–1.35)
Glucose, Bld: 147 mg/dL — ABNORMAL HIGH (ref 70–99)

## 2011-02-28 LAB — POCT I-STAT 3, ART BLOOD GAS (G3+)
Bicarbonate: 23.7 mEq/L (ref 20.0–24.0)
TCO2: 25 mmol/L (ref 0–100)
pCO2 arterial: 34.5 mmHg — ABNORMAL LOW (ref 35.0–45.0)
pH, Arterial: 7.441 (ref 7.350–7.450)
pO2, Arterial: 105 mmHg — ABNORMAL HIGH (ref 80.0–100.0)

## 2011-02-28 LAB — WOUND CULTURE: Gram Stain: NONE SEEN

## 2011-02-28 LAB — PREPARE RBC (CROSSMATCH)

## 2011-02-28 LAB — PROTIME-INR
INR: 1.08 (ref 0.00–1.49)
Prothrombin Time: 14.2 seconds (ref 11.6–15.2)

## 2011-03-01 ENCOUNTER — Inpatient Hospital Stay (HOSPITAL_COMMUNITY): Payer: Non-veteran care

## 2011-03-01 LAB — BLOOD GAS, ARTERIAL
Bicarbonate: 24.6 mEq/L — ABNORMAL HIGH (ref 20.0–24.0)
FIO2: 0.3 %
TCO2: 25.9 mmol/L (ref 0–100)
pCO2 arterial: 42.2 mmHg (ref 35.0–45.0)
pH, Arterial: 7.38 (ref 7.350–7.450)

## 2011-03-01 LAB — CROSSMATCH
ABO/RH(D): O POS
Unit division: 0

## 2011-03-01 LAB — GLUCOSE, CAPILLARY
Glucose-Capillary: 100 mg/dL — ABNORMAL HIGH (ref 70–99)
Glucose-Capillary: 112 mg/dL — ABNORMAL HIGH (ref 70–99)
Glucose-Capillary: 117 mg/dL — ABNORMAL HIGH (ref 70–99)
Glucose-Capillary: 120 mg/dL — ABNORMAL HIGH (ref 70–99)
Glucose-Capillary: 121 mg/dL — ABNORMAL HIGH (ref 70–99)

## 2011-03-01 LAB — CBC
Hemoglobin: 9.2 g/dL — ABNORMAL LOW (ref 13.0–17.0)
RBC: 3.05 MIL/uL — ABNORMAL LOW (ref 4.22–5.81)

## 2011-03-01 LAB — RENAL FUNCTION PANEL
Albumin: 1.5 g/dL — ABNORMAL LOW (ref 3.5–5.2)
Albumin: 1.3 g/dL — ABNORMAL LOW (ref 3.5–5.2)
BUN: 33 mg/dL — ABNORMAL HIGH (ref 6–23)
Chloride: 102 mEq/L (ref 96–112)
Chloride: 99 mEq/L (ref 96–112)
Creatinine, Ser: 1.58 mg/dL — ABNORMAL HIGH (ref 0.50–1.35)
GFR calc non Af Amer: 44 mL/min — ABNORMAL LOW (ref >90)
Potassium: 4.1 mEq/L (ref 3.5–5.1)
Potassium: 3.9 mEq/L (ref 3.5–5.1)
Sodium: 133 mEq/L — ABNORMAL LOW (ref 135–145)

## 2011-03-01 LAB — ANTI-SMITH ANTIBODY: ENA SM Ab Ser-aCnc: 1 AU/mL (ref <30)

## 2011-03-01 LAB — TRIGLYCERIDES: Triglycerides: 269 mg/dL — ABNORMAL HIGH (ref <150)

## 2011-03-01 LAB — CHOLESTEROL, TOTAL: Cholesterol: 129 mg/dL (ref 0–200)

## 2011-03-01 LAB — CULTURE, RESPIRATORY W GRAM STAIN

## 2011-03-01 LAB — MAGNESIUM: Magnesium: 2.3 mg/dL (ref 1.5–2.5)

## 2011-03-01 NOTE — Op Note (Signed)
NAMEDARCEL, FRANE NO.:  [id number]  MEDICAL RECORD NO.:  [id number]  LOCATION:  2116                         FACILITY:  [facility]  PHYSICIAN:  Velora Heckler, MD      DATE OF BIRTH:  November 14, 1945  DATE OF PROCEDURE:  02/25/2011                               OPERATIVE REPORT   PREOPERATIVE DIAGNOSIS:  Perforated viscus.  POSTOPERATIVE DIAGNOSIS:  Perforated pyloric channel ulcer.  PROCEDURES: 1. Exploratory laparotomy. 2. Lysis of adhesions and evacuation of ascites. 3. Redo gram patch closure of pyloric channel ulcer. 4. Closure of abdominal wall with fascial retention sutures.  SURGEON:  Velora Heckler, MD, FACS  ANESTHESIA:  General per Dr. Kipp Brood and Dr. Diamantina Monks.  ESTIMATED BLOOD LOSS:  Minimal.  PREPARATION:  ChloraPrep.  COMPLICATIONS:  None.  INDICATIONS:  The patient is a 65 year old white male managed on the Critical Care Medicine Service with multiorgan system failure.  The patient had undergone laparotomy and gram patch closure of perforated pyloric channel ulcer at [facility] 1 week ago.  He was transferred to [facility] postoperatively for management of multiorgan failure.  The patient was followed by General Surgery.  The patient failed to improved and clinically deteriorated.  White blood cell count rose.  Drain output remained high.  The patient was prepared and taken to the CT scan abdomen and pelvis on the day of surgery.  This showed persistent leakage from the pyloric channel ulcer with free air and extravasated contrast on CT scan.  The patient was urgently prepared and brought to the operating room for exploration.  BODY OF REPORT:  Procedures done in OR #17 at the [facility].  The patient was brought to the operating room, placed in a supine position on the operating room table.  Following administration of general anesthesia, the patient was positioned and then prepped and draped in the  usual strict aseptic fashion.  After ascertaining that an adequate level of anesthesia been achieved, the patient's previous midline abdominal incision was opened by removing the staples.  The subcutaneous tissues immediately separated as there has been no significant healing.  Fascial sutures were removed and the fascia immediately separated.  There was approximately 500-700 mL of ascitic fluid within the peritoneal cavity which was evacuated.  There were soft adhesions throughout the entire small bowel with fibrinous exudate coating the surface of most of the intestine.  Some of this was debrided and loculations were broken up.  Small bowel was run from ligament of Treitz to ileocecal valve.  No evidence of small bowel injury or perforation.  Colon appeared viable.  Again fluid was evacuated from the abdomen and the pelvis.  Abdominal midline incision was extended both cephalad and caudad with the electrocautery.  Bowel for retractors placed for exposure. Examination of the upper abdomen showed moderate amount of dark fluid and some necrosis of the omentum.  This was debrided.  Fluid was evacuated.  There was no active bleeding.  There appeared to be a gram patch closure over a pyloric channel ulcer.  It appeared that the ulcer has been closed with sutures.  However the most cephalad  portion of the ulcer remained open and there was free drainage of gastric contents into the peritoneal cavity.  It appeared that the gram patch may have slipped off of this portion of the ulcer closure and the sutures may have pulled through the surrounding tissue.  Gram patch was elevated off of the closure.  A new flap of omentum was created so as to reach the undersurface of the liver.  Four 2-0 silk sutures were placed full- thickness through either side of the ulcer.  The omental flap was placed over the ulcer and secured to the tissue in the hilum of the liver with a single silk suture.  The  4  separate 2-0 silk sutures were then tied firmly over the omental flap so as to create a gram patch closure of the pyloric channel ulcer with a good seal.  Suture tails were excised.  No further drainage was identified.  The omental flap appeared viable.  Two 19-French Blake drains were brought in from stab wounds in the left upper quadrant and right midabdomen and placed across the gram patch closure coming from opposite directions.  Drains were secured to the skin with 2-0 nylon sutures.  Abdomen was irrigated copiously with warm saline which was evacuated.  Good hemostasis was noted.  Abdominal wall was closed with a single interrupted layer of #1 Novafil simple sutures in the fascia.  Four separate #5 Ethibond fascial retention sutures were placed along the wound and tied securely over plastic bridges.  Subcutaneous tissues were packed with Betadine-soaked 4 x 4 gauze sponges.  Dry gauze followed by ABD pads were placed.  Drains were placed to bulb suction.  The patient was transported by anesthesia back to the intensive care unit on the ventilator.  The patient tolerated the procedure well.   Velora Heckler, MD, FACS     TMG/MEDQ  D:  02/25/2011  T:  02/25/2011  Job:  [id number]  cc:   Dalia Heading, M.D.  Electronically Signed by Darnell Level MD on 03/01/2011 12:42:31 PM

## 2011-03-02 ENCOUNTER — Inpatient Hospital Stay (HOSPITAL_COMMUNITY): Payer: Non-veteran care

## 2011-03-02 DIAGNOSIS — N170 Acute kidney failure with tubular necrosis: Secondary | ICD-10-CM

## 2011-03-02 DIAGNOSIS — J9600 Acute respiratory failure, unspecified whether with hypoxia or hypercapnia: Secondary | ICD-10-CM

## 2011-03-02 DIAGNOSIS — K659 Peritonitis, unspecified: Secondary | ICD-10-CM

## 2011-03-02 DIAGNOSIS — R6521 Severe sepsis with septic shock: Secondary | ICD-10-CM

## 2011-03-02 DIAGNOSIS — A419 Sepsis, unspecified organism: Secondary | ICD-10-CM

## 2011-03-02 LAB — GLUCOSE, CAPILLARY
Glucose-Capillary: 123 mg/dL — ABNORMAL HIGH (ref 70–99)
Glucose-Capillary: 125 mg/dL — ABNORMAL HIGH (ref 70–99)

## 2011-03-02 LAB — CBC
HCT: 24.1 % — ABNORMAL LOW (ref 39.0–52.0)
Hemoglobin: 8.2 g/dL — ABNORMAL LOW (ref 13.0–17.0)
MCH: 30 pg (ref 26.0–34.0)
MCV: 88.3 fL (ref 78.0–100.0)
Platelets: 184 10*3/uL (ref 150–400)
RBC: 2.73 MIL/uL — ABNORMAL LOW (ref 4.22–5.81)

## 2011-03-02 LAB — RENAL FUNCTION PANEL
BUN: 32 mg/dL — ABNORMAL HIGH (ref 6–23)
CO2: 23 mEq/L (ref 19–32)
CO2: 25 mEq/L (ref 19–32)
Calcium: 9.1 mg/dL (ref 8.4–10.5)
Chloride: 101 mEq/L (ref 96–112)
Creatinine, Ser: 1.45 mg/dL — ABNORMAL HIGH (ref 0.50–1.35)
GFR calc Af Amer: 53 mL/min — ABNORMAL LOW (ref >90)
GFR calc non Af Amer: 46 mL/min — ABNORMAL LOW (ref >90)
Glucose, Bld: 139 mg/dL — ABNORMAL HIGH (ref 70–99)
Glucose, Bld: 113 mg/dL — ABNORMAL HIGH (ref 70–99)
Potassium: 3.5 mEq/L (ref 3.5–5.1)
Sodium: 134 mEq/L — ABNORMAL LOW (ref 135–145)

## 2011-03-03 ENCOUNTER — Inpatient Hospital Stay (HOSPITAL_COMMUNITY): Payer: Non-veteran care

## 2011-03-03 LAB — RENAL FUNCTION PANEL
Albumin: 1.7 g/dL — ABNORMAL LOW (ref 3.5–5.2)
BUN: 33 mg/dL — ABNORMAL HIGH (ref 6–23)
CO2: 24 mEq/L (ref 19–32)
Calcium: 8.6 mg/dL (ref 8.4–10.5)
Chloride: 99 mEq/L (ref 96–112)
Creatinine, Ser: 1.33 mg/dL (ref 0.50–1.35)
Creatinine, Ser: 1.44 mg/dL — ABNORMAL HIGH (ref 0.50–1.35)
GFR calc Af Amer: 57 mL/min — ABNORMAL LOW (ref >90)
Glucose, Bld: 115 mg/dL — ABNORMAL HIGH (ref 70–99)
Phosphorus: 1 mg/dL — CL (ref 2.3–4.6)
Sodium: 133 mEq/L — ABNORMAL LOW (ref 135–145)

## 2011-03-03 LAB — ANAEROBIC CULTURE

## 2011-03-03 LAB — CBC
HCT: 26 % — ABNORMAL LOW (ref 39.0–52.0)
Hemoglobin: 8.8 g/dL — ABNORMAL LOW (ref 13.0–17.0)
WBC: 16.6 10*3/uL — ABNORMAL HIGH (ref 4.0–10.5)

## 2011-03-03 NOTE — Consult Note (Signed)
NAMESANDEEP, RADELL NO.:  [id number]  MEDICAL RECORD NO.:  [id number]  LOCATION:  2116                         FACILITY:  [facility]  PHYSICIAN:  Terrial Rhodes, M.D.DATE OF BIRTH:  [birth year]  DATE OF CONSULTATION:  02/20/2011 DATE OF DISCHARGE:                                CONSULTATION   CONSULTATION PHYSICIAN:  Leslye Peer, MD  REASON FOR CONSULTATION:  Acute renal failure.  HISTORY OF PRESENT ILLNESS:  [name] is a 65 year old white male without significant past medical history who presents to the [facility] with abdominal pain.  He underwent exploratory laparotomy with closure of a duodenal ulcer perforation, postoperatively developed sepsis with multiorgan failure and acute renal failure.  He has had decreasing urine output and rising creatinine and we were asked to help further evaluate and manage his acute renal failure and was transferred here for consideration for CVVHD.  His trend in creatinine is as follows, on October 10, his creatinine was 2.2, on October 11, was 3.04, on October 12, was 3.88, and today was 5.81. The bicarb dropped to 14, was started on a bicarb drip, which have improved to 21.  The patient has been intubated, maintain on Levophed with tachycardia.  ALLERGIES:  He has no known drug allergies, but he is allergic to EGG PRODUCTS and EGG WHITES.  PAST MEDICAL HISTORY:  Depression.  OUTPATIENT MEDICINES: 1. Zoloft 100 mg a day. 2. Naprosyn p.r.n. 3. Hemocyte daily.  FAMILY HISTORY:  Noncontributory.  SOCIAL HISTORY:  He is married, with 2 daughters, but unable to obtain other information.  REVIEW OF SYSTEMS:  Unable to obtain due to the patient's mental status.  PHYSICAL EXAM:  GENERAL:  This is a well-developed, well-nourished man intubated and sedated. VITAL SIGNS:  Temperature 101.1 pulse 157, respiratory rate 23, blood pressure 128/68.  He had no urine output. HEENT:  Normocephalic.  No  icterus. LUNGS:  Have scattered rhonchi. CARDIAC:  Tachycardic.  No rub appreciated. ABDOMEN:  Hypoactive bowel sounds.  Large ventral scar with staples.  A drain is in place in the right lower quadrant.  EXTREMITIES:  He has 1+ pretibial edema, right greater than left.  LABORATORY DATA:  Sodium 139, potassium 3.6, chloride 103, CO2 of  21, BUN 59, creatinine 5.81, glucose 88, calcium 5.9, albumin 1.6.  White blood cell count 10.5, hemoglobin 7.6, platelets 278.  ASSESSMENT AND PLAN: 1. Oliguric acute renal failure in the setting of multiorgan failure,     on pressors plan initiate continuous venovenous hemodialysis. 2. Metabolic acidosis secondary to #1.  We will start bicarb drip once     continuous venovenous hemodialysis initiated. 3. Perforated duodenal ulcer, status post emergent surgery, on     antibiotics of pressors. 4. Ventilator-dependent respiratory failure per Pulmonary Critical     Care. 5. Acute blood loss anemia.  Transfuse as needed. 6. Tachycardia, recommends transitioning from Levophed to Neo-     Synephrine or vasopressin for blood pressure control without the     positive chronotropic effects.  The patient remains critically ill.  Thank you for this consultation.          ______________________________ Terrial Rhodes, M.D.  JC/MEDQ  D:  02/20/2011  T:  02/20/2011  Job:  [id number]  Electronically Signed by Terrial Rhodes M.D. on 03/03/2011 08:51:41 AM

## 2011-03-04 ENCOUNTER — Inpatient Hospital Stay (HOSPITAL_COMMUNITY): Payer: Non-veteran care

## 2011-03-04 DIAGNOSIS — J9600 Acute respiratory failure, unspecified whether with hypoxia or hypercapnia: Secondary | ICD-10-CM

## 2011-03-04 DIAGNOSIS — A419 Sepsis, unspecified organism: Secondary | ICD-10-CM

## 2011-03-04 DIAGNOSIS — K659 Peritonitis, unspecified: Secondary | ICD-10-CM

## 2011-03-04 DIAGNOSIS — R6521 Severe sepsis with septic shock: Secondary | ICD-10-CM

## 2011-03-04 DIAGNOSIS — N170 Acute kidney failure with tubular necrosis: Secondary | ICD-10-CM

## 2011-03-04 LAB — CULTURE, BLOOD (SINGLE): Culture: NO GROWTH

## 2011-03-04 LAB — CBC
HCT: 27.5 % — ABNORMAL LOW (ref 39.0–52.0)
Hemoglobin: 9.3 g/dL — ABNORMAL LOW (ref 13.0–17.0)
MCHC: 33.8 g/dL (ref 30.0–36.0)
RBC: 3.1 MIL/uL — ABNORMAL LOW (ref 4.22–5.81)

## 2011-03-04 LAB — COMPREHENSIVE METABOLIC PANEL
ALT: 16 U/L (ref 0–53)
Alkaline Phosphatase: 228 U/L — ABNORMAL HIGH (ref 39–117)
BUN: 32 mg/dL — ABNORMAL HIGH (ref 6–23)
CO2: 21 mEq/L (ref 19–32)
GFR calc Af Amer: 56 mL/min — ABNORMAL LOW (ref >90)
GFR calc non Af Amer: 48 mL/min — ABNORMAL LOW (ref >90)
Glucose, Bld: 146 mg/dL — ABNORMAL HIGH (ref 70–99)
Potassium: 4 mEq/L (ref 3.5–5.1)
Total Bilirubin: 0.4 mg/dL (ref 0.3–1.2)
Total Protein: 6.7 g/dL (ref 6.0–8.3)

## 2011-03-04 LAB — RENAL FUNCTION PANEL
BUN: 35 mg/dL — ABNORMAL HIGH (ref 6–23)
CO2: 24 mEq/L (ref 19–32)
Chloride: 99 mEq/L (ref 96–112)
GFR calc Af Amer: 49 mL/min — ABNORMAL LOW (ref >90)
Glucose, Bld: 127 mg/dL — ABNORMAL HIGH (ref 70–99)
Potassium: 3.7 mEq/L (ref 3.5–5.1)
Sodium: 131 mEq/L — ABNORMAL LOW (ref 135–145)

## 2011-03-04 LAB — HEMOGLOBIN A1C
Hgb A1c MFr Bld: 5.5 % (ref <5.7)
Mean Plasma Glucose: 111 mg/dL (ref <117)

## 2011-03-04 LAB — MAGNESIUM: Magnesium: 2.1 mg/dL (ref 1.5–2.5)

## 2011-03-04 LAB — GLUCOSE, CAPILLARY: Glucose-Capillary: 134 mg/dL — ABNORMAL HIGH (ref 70–99)

## 2011-03-05 ENCOUNTER — Inpatient Hospital Stay (HOSPITAL_COMMUNITY): Payer: Non-veteran care

## 2011-03-05 LAB — GLUCOSE, CAPILLARY
Glucose-Capillary: 133 mg/dL — ABNORMAL HIGH (ref 70–99)
Glucose-Capillary: 140 mg/dL — ABNORMAL HIGH (ref 70–99)
Glucose-Capillary: 134 mg/dL — ABNORMAL HIGH (ref 70–99)

## 2011-03-05 LAB — CBC
HCT: 24.5 % — ABNORMAL LOW (ref 39.0–52.0)
Hemoglobin: 8.2 g/dL — ABNORMAL LOW (ref 13.0–17.0)
Hemoglobin: 9.6 g/dL — ABNORMAL LOW (ref 13.0–17.0)
MCH: 29.5 pg (ref 26.0–34.0)
MCHC: 33.5 g/dL (ref 30.0–36.0)
MCV: 88.1 fL (ref 78.0–100.0)
Platelets: 282 10*3/uL (ref 150–400)
RBC: 3.25 MIL/uL — ABNORMAL LOW (ref 4.22–5.81)
RDW: 14.6 % (ref 11.5–15.5)
WBC: 22.3 10*3/uL — ABNORMAL HIGH (ref 4.0–10.5)

## 2011-03-05 LAB — RENAL FUNCTION PANEL
BUN: 34 mg/dL — ABNORMAL HIGH (ref 6–23)
CO2: 24 mEq/L (ref 19–32)
CO2: 25 mEq/L (ref 19–32)
Calcium: 9.5 mg/dL (ref 8.4–10.5)
Chloride: 98 mEq/L (ref 96–112)
GFR calc Af Amer: 53 mL/min — ABNORMAL LOW (ref >90)
GFR calc Af Amer: 54 mL/min — ABNORMAL LOW (ref >90)
GFR calc non Af Amer: 46 mL/min — ABNORMAL LOW (ref >90)
Glucose, Bld: 135 mg/dL — ABNORMAL HIGH (ref 70–99)
Glucose, Bld: 128 mg/dL — ABNORMAL HIGH (ref 70–99)
Phosphorus: 2.9 mg/dL (ref 2.3–4.6)
Potassium: 4.2 mEq/L (ref 3.5–5.1)
Potassium: 4.3 mEq/L (ref 3.5–5.1)
Sodium: 131 mEq/L — ABNORMAL LOW (ref 135–145)
Sodium: 132 mEq/L — ABNORMAL LOW (ref 135–145)

## 2011-03-05 LAB — DIFFERENTIAL
Basophils Relative: 1 % (ref 0–1)
Eosinophils Absolute: 0.2 10*3/uL (ref 0.0–0.7)
Eosinophils Relative: 1 % (ref 0–5)
Monocytes Absolute: 3 10*3/uL — ABNORMAL HIGH (ref 0.1–1.0)
Neutro Abs: 16.6 10*3/uL — ABNORMAL HIGH (ref 1.7–7.7)
Neutrophils Relative %: 76 % (ref 43–77)

## 2011-03-05 LAB — URINE MICROSCOPIC-ADD ON

## 2011-03-05 LAB — URINALYSIS, ROUTINE W REFLEX MICROSCOPIC
Nitrite: POSITIVE — AB
Specific Gravity, Urine: 1.019 (ref 1.005–1.030)
Urobilinogen, UA: 1 mg/dL (ref 0.0–1.0)
pH: 6 (ref 5.0–8.0)

## 2011-03-06 ENCOUNTER — Inpatient Hospital Stay (HOSPITAL_COMMUNITY): Payer: Non-veteran care

## 2011-03-06 DIAGNOSIS — N170 Acute kidney failure with tubular necrosis: Secondary | ICD-10-CM

## 2011-03-06 DIAGNOSIS — R6521 Severe sepsis with septic shock: Secondary | ICD-10-CM

## 2011-03-06 DIAGNOSIS — J9600 Acute respiratory failure, unspecified whether with hypoxia or hypercapnia: Secondary | ICD-10-CM

## 2011-03-06 DIAGNOSIS — K659 Peritonitis, unspecified: Secondary | ICD-10-CM

## 2011-03-06 DIAGNOSIS — A419 Sepsis, unspecified organism: Secondary | ICD-10-CM

## 2011-03-06 LAB — GLUCOSE, CAPILLARY
Glucose-Capillary: 144 mg/dL — ABNORMAL HIGH (ref 70–99)
Glucose-Capillary: 136 mg/dL — ABNORMAL HIGH (ref 70–99)
Glucose-Capillary: 141 mg/dL — ABNORMAL HIGH (ref 70–99)
Glucose-Capillary: 137 mg/dL — ABNORMAL HIGH (ref 70–99)
Glucose-Capillary: 143 mg/dL — ABNORMAL HIGH (ref 70–99)

## 2011-03-06 LAB — BASIC METABOLIC PANEL
BUN: 32 mg/dL — ABNORMAL HIGH (ref 6–23)
CO2: 25 mEq/L (ref 19–32)
Calcium: 9.4 mg/dL (ref 8.4–10.5)
Glucose, Bld: 121 mg/dL — ABNORMAL HIGH (ref 70–99)
Potassium: 4.3 mEq/L (ref 3.5–5.1)
Sodium: 130 mEq/L — ABNORMAL LOW (ref 135–145)

## 2011-03-06 LAB — CBC
HCT: 27.2 % — ABNORMAL LOW (ref 39.0–52.0)
Hemoglobin: 9.1 g/dL — ABNORMAL LOW (ref 13.0–17.0)
MCHC: 33.5 g/dL (ref 30.0–36.0)
MCV: 87.5 fL (ref 78.0–100.0)
RDW: 15 % (ref 11.5–15.5)

## 2011-03-06 LAB — RENAL FUNCTION PANEL
Calcium: 9.6 mg/dL (ref 8.4–10.5)
GFR calc Af Amer: 51 mL/min — ABNORMAL LOW (ref >90)
GFR calc non Af Amer: 44 mL/min — ABNORMAL LOW (ref >90)
Phosphorus: 2 mg/dL — ABNORMAL LOW (ref 2.3–4.6)
Potassium: 4.1 mEq/L (ref 3.5–5.1)
Sodium: 128 mEq/L — ABNORMAL LOW (ref 135–145)

## 2011-03-06 LAB — CROSSMATCH: Antibody Screen: NEGATIVE

## 2011-03-06 LAB — URINE CULTURE

## 2011-03-06 LAB — PHOSPHORUS: Phosphorus: 1.6 mg/dL — ABNORMAL LOW (ref 2.3–4.6)

## 2011-03-07 DIAGNOSIS — E43 Unspecified severe protein-calorie malnutrition: Secondary | ICD-10-CM

## 2011-03-07 LAB — RENAL FUNCTION PANEL
Albumin: 2 g/dL — ABNORMAL LOW (ref 3.5–5.2)
Albumin: 1.9 g/dL — ABNORMAL LOW (ref 3.5–5.2)
BUN: 33 mg/dL — ABNORMAL HIGH (ref 6–23)
CO2: 24 mEq/L (ref 19–32)
Calcium: 9.2 mg/dL (ref 8.4–10.5)
Chloride: 94 mEq/L — ABNORMAL LOW (ref 96–112)
Creatinine, Ser: 1.42 mg/dL — ABNORMAL HIGH (ref 0.50–1.35)
Creatinine, Ser: 1.48 mg/dL — ABNORMAL HIGH (ref 0.50–1.35)
GFR calc non Af Amer: 50 mL/min — ABNORMAL LOW (ref >90)
Glucose, Bld: 121 mg/dL — ABNORMAL HIGH (ref 70–99)
Phosphorus: 2.1 mg/dL — ABNORMAL LOW (ref 2.3–4.6)
Potassium: 3.5 mEq/L (ref 3.5–5.1)

## 2011-03-07 LAB — GLUCOSE, CAPILLARY
Glucose-Capillary: 109 mg/dL — ABNORMAL HIGH (ref 70–99)
Glucose-Capillary: 131 mg/dL — ABNORMAL HIGH (ref 70–99)
Glucose-Capillary: 133 mg/dL — ABNORMAL HIGH (ref 70–99)
Glucose-Capillary: 140 mg/dL — ABNORMAL HIGH (ref 70–99)
Glucose-Capillary: 142 mg/dL — ABNORMAL HIGH (ref 70–99)
Glucose-Capillary: 145 mg/dL — ABNORMAL HIGH (ref 70–99)

## 2011-03-07 LAB — MAGNESIUM: Magnesium: 2.2 mg/dL (ref 1.5–2.5)

## 2011-03-07 LAB — CBC
HCT: 23 % — ABNORMAL LOW (ref 39.0–52.0)
Hemoglobin: 7.7 g/dL — ABNORMAL LOW (ref 13.0–17.0)
MCV: 87.1 fL (ref 78.0–100.0)
RBC: 2.64 MIL/uL — ABNORMAL LOW (ref 4.22–5.81)
WBC: 15.3 10*3/uL — ABNORMAL HIGH (ref 4.0–10.5)

## 2011-03-08 DIAGNOSIS — J9600 Acute respiratory failure, unspecified whether with hypoxia or hypercapnia: Secondary | ICD-10-CM

## 2011-03-08 DIAGNOSIS — N170 Acute kidney failure with tubular necrosis: Secondary | ICD-10-CM

## 2011-03-08 DIAGNOSIS — A419 Sepsis, unspecified organism: Secondary | ICD-10-CM

## 2011-03-08 DIAGNOSIS — K659 Peritonitis, unspecified: Secondary | ICD-10-CM

## 2011-03-08 DIAGNOSIS — R6521 Severe sepsis with septic shock: Secondary | ICD-10-CM

## 2011-03-08 LAB — CBC
Hemoglobin: 7.1 g/dL — ABNORMAL LOW (ref 13.0–17.0)
Hemoglobin: 7 g/dL — ABNORMAL LOW (ref 13.0–17.0)
MCH: 28.3 pg (ref 26.0–34.0)
MCH: 28.6 pg (ref 26.0–34.0)
MCHC: 32.7 g/dL (ref 30.0–36.0)
MCV: 87.3 fL (ref 78.0–100.0)
RBC: 2.45 MIL/uL — ABNORMAL LOW (ref 4.22–5.81)
RDW: 14.7 % (ref 11.5–15.5)

## 2011-03-08 LAB — RENAL FUNCTION PANEL
BUN: 30 mg/dL — ABNORMAL HIGH (ref 6–23)
CO2: 27 mEq/L (ref 19–32)
Calcium: 9.2 mg/dL (ref 8.4–10.5)
Creatinine, Ser: 1.35 mg/dL (ref 0.50–1.35)
Glucose, Bld: 107 mg/dL — ABNORMAL HIGH (ref 70–99)

## 2011-03-08 LAB — COMPREHENSIVE METABOLIC PANEL
ALT: 16 U/L (ref 0–53)
Albumin: 1.9 g/dL — ABNORMAL LOW (ref 3.5–5.2)
BUN: 32 mg/dL — ABNORMAL HIGH (ref 6–23)
Calcium: 9.2 mg/dL (ref 8.4–10.5)
GFR calc Af Amer: 57 mL/min — ABNORMAL LOW (ref >90)
Glucose, Bld: 118 mg/dL — ABNORMAL HIGH (ref 70–99)
Sodium: 131 mEq/L — ABNORMAL LOW (ref 135–145)
Total Protein: 6 g/dL (ref 6.0–8.3)

## 2011-03-08 LAB — GLUCOSE, CAPILLARY
Glucose-Capillary: 120 mg/dL — ABNORMAL HIGH (ref 70–99)
Glucose-Capillary: 123 mg/dL — ABNORMAL HIGH (ref 70–99)
Glucose-Capillary: 148 mg/dL — ABNORMAL HIGH (ref 70–99)

## 2011-03-08 LAB — VANCOMYCIN, TROUGH: Vancomycin Tr: 15.8 ug/mL (ref 10.0–20.0)

## 2011-03-08 LAB — PREALBUMIN: Prealbumin: 28.2 mg/dL (ref 17.0–34.0)

## 2011-03-08 LAB — MAGNESIUM: Magnesium: 2.3 mg/dL (ref 1.5–2.5)

## 2011-03-08 LAB — TRIGLYCERIDES: Triglycerides: 258 mg/dL — ABNORMAL HIGH (ref <150)

## 2011-03-09 ENCOUNTER — Inpatient Hospital Stay (HOSPITAL_COMMUNITY): Payer: Non-veteran care

## 2011-03-09 DIAGNOSIS — K659 Peritonitis, unspecified: Secondary | ICD-10-CM

## 2011-03-09 DIAGNOSIS — J9600 Acute respiratory failure, unspecified whether with hypoxia or hypercapnia: Secondary | ICD-10-CM

## 2011-03-09 DIAGNOSIS — N170 Acute kidney failure with tubular necrosis: Secondary | ICD-10-CM

## 2011-03-09 DIAGNOSIS — Z9911 Dependence on respirator [ventilator] status: Secondary | ICD-10-CM

## 2011-03-09 LAB — CBC
HCT: 20.4 % — ABNORMAL LOW (ref 39.0–52.0)
MCHC: 32.4 g/dL (ref 30.0–36.0)
MCHC: 32.8 g/dL (ref 30.0–36.0)
MCV: 88.7 fL (ref 78.0–100.0)
RDW: 14.8 % (ref 11.5–15.5)
RDW: 15.1 % (ref 11.5–15.5)

## 2011-03-09 LAB — GLUCOSE, CAPILLARY
Glucose-Capillary: 102 mg/dL — ABNORMAL HIGH (ref 70–99)
Glucose-Capillary: 108 mg/dL — ABNORMAL HIGH (ref 70–99)
Glucose-Capillary: 137 mg/dL — ABNORMAL HIGH (ref 70–99)
Glucose-Capillary: 97 mg/dL (ref 70–99)

## 2011-03-09 LAB — RENAL FUNCTION PANEL
Albumin: 2.1 g/dL — ABNORMAL LOW (ref 3.5–5.2)
BUN: 31 mg/dL — ABNORMAL HIGH (ref 6–23)
BUN: 29 mg/dL — ABNORMAL HIGH (ref 6–23)
Calcium: 8.9 mg/dL (ref 8.4–10.5)
Calcium: 9.3 mg/dL (ref 8.4–10.5)
GFR calc Af Amer: 60 mL/min — ABNORMAL LOW (ref >90)
Glucose, Bld: 120 mg/dL — ABNORMAL HIGH (ref 70–99)
Phosphorus: 2.9 mg/dL (ref 2.3–4.6)
Phosphorus: 3.1 mg/dL (ref 2.3–4.6)
Potassium: 4.9 mEq/L (ref 3.5–5.1)
Sodium: 132 mEq/L — ABNORMAL LOW (ref 135–145)
Sodium: 132 mEq/L — ABNORMAL LOW (ref 135–145)

## 2011-03-09 LAB — POCT ACTIVATED CLOTTING TIME
Activated Clotting Time: 149 seconds
Activated Clotting Time: 149 seconds

## 2011-03-09 LAB — MAGNESIUM: Magnesium: 2.5 mg/dL (ref 1.5–2.5)

## 2011-03-10 ENCOUNTER — Inpatient Hospital Stay (HOSPITAL_COMMUNITY): Payer: Non-veteran care

## 2011-03-10 LAB — POCT ACTIVATED CLOTTING TIME
Activated Clotting Time: 149 seconds
Activated Clotting Time: 144 seconds
Activated Clotting Time: 160 seconds
Activated Clotting Time: 166 seconds
Activated Clotting Time: 160 seconds
Activated Clotting Time: 171 seconds
Activated Clotting Time: 177 seconds
Activated Clotting Time: 171 seconds

## 2011-03-10 LAB — GLUCOSE, CAPILLARY
Glucose-Capillary: 129 mg/dL — ABNORMAL HIGH (ref 70–99)
Glucose-Capillary: 115 mg/dL — ABNORMAL HIGH (ref 70–99)
Glucose-Capillary: 110 mg/dL — ABNORMAL HIGH (ref 70–99)

## 2011-03-10 LAB — RENAL FUNCTION PANEL
Albumin: 2.3 g/dL — ABNORMAL LOW (ref 3.5–5.2)
CO2: 26 mEq/L (ref 19–32)
Chloride: 97 mEq/L (ref 96–112)
Creatinine, Ser: 1.4 mg/dL — ABNORMAL HIGH (ref 0.50–1.35)
GFR calc Af Amer: 59 mL/min — ABNORMAL LOW (ref >90)
GFR calc non Af Amer: 51 mL/min — ABNORMAL LOW (ref >90)
Potassium: 4.6 mEq/L (ref 3.5–5.1)
Sodium: 131 mEq/L — ABNORMAL LOW (ref 135–145)

## 2011-03-10 LAB — CBC
HCT: 26.7 % — ABNORMAL LOW (ref 39.0–52.0)
Hemoglobin: 8.7 g/dL — ABNORMAL LOW (ref 13.0–17.0)
MCHC: 32.6 g/dL (ref 30.0–36.0)
RBC: 3.02 MIL/uL — ABNORMAL LOW (ref 4.22–5.81)

## 2011-03-10 LAB — MAGNESIUM: Magnesium: 2.5 mg/dL (ref 1.5–2.5)

## 2011-03-11 ENCOUNTER — Inpatient Hospital Stay (HOSPITAL_COMMUNITY): Payer: Non-veteran care

## 2011-03-11 DIAGNOSIS — I959 Hypotension, unspecified: Secondary | ICD-10-CM

## 2011-03-11 DIAGNOSIS — R52 Pain, unspecified: Secondary | ICD-10-CM

## 2011-03-11 DIAGNOSIS — N170 Acute kidney failure with tubular necrosis: Secondary | ICD-10-CM

## 2011-03-11 DIAGNOSIS — K659 Peritonitis, unspecified: Secondary | ICD-10-CM

## 2011-03-11 LAB — GLUCOSE, CAPILLARY
Glucose-Capillary: 103 mg/dL — ABNORMAL HIGH (ref 70–99)
Glucose-Capillary: 112 mg/dL — ABNORMAL HIGH (ref 70–99)
Glucose-Capillary: 126 mg/dL — ABNORMAL HIGH (ref 70–99)

## 2011-03-11 LAB — COMPREHENSIVE METABOLIC PANEL
ALT: 24 U/L (ref 0–53)
CO2: 22 mEq/L (ref 19–32)
Calcium: 10 mg/dL (ref 8.4–10.5)
Creatinine, Ser: 2.61 mg/dL — ABNORMAL HIGH (ref 0.50–1.35)
GFR calc Af Amer: 28 mL/min — ABNORMAL LOW (ref >90)
GFR calc non Af Amer: 24 mL/min — ABNORMAL LOW (ref >90)
Glucose, Bld: 102 mg/dL — ABNORMAL HIGH (ref 70–99)
Sodium: 127 mEq/L — ABNORMAL LOW (ref 135–145)
Total Bilirubin: 0.6 mg/dL (ref 0.3–1.2)

## 2011-03-11 LAB — CBC
HCT: 25.1 % — ABNORMAL LOW (ref 39.0–52.0)
Hemoglobin: 8.2 g/dL — ABNORMAL LOW (ref 13.0–17.0)
MCH: 28.6 pg (ref 26.0–34.0)
MCHC: 32.7 g/dL (ref 30.0–36.0)

## 2011-03-12 ENCOUNTER — Inpatient Hospital Stay (HOSPITAL_COMMUNITY): Payer: Non-veteran care

## 2011-03-12 DIAGNOSIS — R6521 Severe sepsis with septic shock: Secondary | ICD-10-CM

## 2011-03-12 DIAGNOSIS — A419 Sepsis, unspecified organism: Secondary | ICD-10-CM

## 2011-03-12 LAB — HEPATITIS B CORE ANTIBODY, TOTAL: Hep B Core Total Ab: NEGATIVE

## 2011-03-12 LAB — BASIC METABOLIC PANEL
BUN: 105 mg/dL — ABNORMAL HIGH (ref 6–23)
CO2: 17 mEq/L — ABNORMAL LOW (ref 19–32)
Calcium: 10.2 mg/dL (ref 8.4–10.5)
Creatinine, Ser: 4.21 mg/dL — ABNORMAL HIGH (ref 0.50–1.35)
GFR calc non Af Amer: 13 mL/min — ABNORMAL LOW (ref >90)
Glucose, Bld: 102 mg/dL — ABNORMAL HIGH (ref 70–99)

## 2011-03-12 LAB — CBC
HCT: 21.2 % — ABNORMAL LOW (ref 39.0–52.0)
Hemoglobin: 7.1 g/dL — ABNORMAL LOW (ref 13.0–17.0)
MCH: 28.1 pg (ref 26.0–34.0)
MCHC: 33.5 g/dL (ref 30.0–36.0)
MCV: 83.8 fL (ref 78.0–100.0)
RBC: 2.53 MIL/uL — ABNORMAL LOW (ref 4.22–5.81)

## 2011-03-12 LAB — GLUCOSE, CAPILLARY
Glucose-Capillary: 107 mg/dL — ABNORMAL HIGH (ref 70–99)
Glucose-Capillary: 112 mg/dL — ABNORMAL HIGH (ref 70–99)
Glucose-Capillary: 125 mg/dL — ABNORMAL HIGH (ref 70–99)

## 2011-03-12 LAB — HEPATITIS B SURFACE ANTIBODY,QUALITATIVE: Hep B S Ab: NEGATIVE

## 2011-03-12 LAB — APTT: aPTT: 41 seconds — ABNORMAL HIGH (ref 24–37)

## 2011-03-12 LAB — PREPARE RBC (CROSSMATCH)

## 2011-03-12 MED ORDER — SODIUM CHLORIDE 0.9 % IV SOLN
100.0000 mg | INTRAVENOUS | Status: DC
  Filled 2011-03-12 (×2): qty 100

## 2011-03-12 MED ORDER — FENTANYL 100 MCG/HR TD PT72: 200.0000 ug | MEDICATED_PATCH | TRANSDERMAL | Status: DC

## 2011-03-12 MED ORDER — ONDANSETRON HCL 4 MG/2ML IJ SOLN: 4.0000 mg | Freq: Four times a day (QID) | INTRAMUSCULAR | Status: DC | PRN

## 2011-03-12 MED ORDER — ENOXAPARIN SODIUM 30 MG/0.3ML ~~LOC~~ SOLN
30.0000 mg | SUBCUTANEOUS | Status: DC
  Filled 2011-03-12 (×2): qty 0.3

## 2011-03-12 MED ORDER — NEPRO/CARBSTEADY PO LIQD
237.0000 mL | Freq: Three times a day (TID) | ORAL | Status: DC | PRN
  Filled 2011-03-12: qty 237

## 2011-03-12 MED ORDER — ZOLPIDEM TARTRATE 5 MG PO TABS: 5.0000 mg | ORAL_TABLET | Freq: Every evening | ORAL | Status: DC | PRN

## 2011-03-12 MED ORDER — CAMPHOR-MENTHOL 0.5-0.5 % EX LOTN
TOPICAL_LOTION | CUTANEOUS | Status: DC | PRN
  Filled 2011-03-12: qty 222

## 2011-03-12 MED ORDER — PROMETHAZINE HCL 25 MG RE SUPP: 25.0000 mg | Freq: Two times a day (BID) | RECTAL | Status: DC | PRN

## 2011-03-12 MED ORDER — DIPHENHYDRAMINE HCL 50 MG/ML IJ SOLN: 12.5000 mg | Freq: Four times a day (QID) | INTRAMUSCULAR | Status: DC | PRN

## 2011-03-12 MED ORDER — HYDROXYZINE HCL 25 MG PO TABS
25.0000 mg | ORAL_TABLET | Freq: Three times a day (TID) | ORAL | Status: DC | PRN
  Filled 2011-03-12: qty 1

## 2011-03-12 MED ORDER — PROMETHAZINE HCL 25 MG PO TABS: 12.5000 mg | ORAL_TABLET | Freq: Four times a day (QID) | ORAL | Status: DC | PRN

## 2011-03-12 MED ORDER — DIPHENHYDRAMINE HCL 12.5 MG/5ML PO ELIX
12.5000 mg | ORAL_SOLUTION | Freq: Four times a day (QID) | ORAL | Status: DC | PRN
  Filled 2011-03-12: qty 5

## 2011-03-12 MED ORDER — HYDROMORPHONE 0.3 MG/ML IV SOLN: INTRAVENOUS | Status: DC

## 2011-03-12 MED ORDER — SODIUM CHLORIDE 0.9 % IJ SOLN: 9.0000 mL | INTRAMUSCULAR | Status: DC | PRN

## 2011-03-12 MED ORDER — ACETAMINOPHEN 325 MG PO TABS: 650.0000 mg | ORAL_TABLET | Freq: Four times a day (QID) | ORAL | Status: DC | PRN

## 2011-03-12 MED ORDER — ALBUTEROL SULFATE (5 MG/ML) 0.5% IN NEBU
2.5000 mg | INHALATION_SOLUTION | RESPIRATORY_TRACT | Status: DC | PRN
  Filled 2011-03-12: qty 20

## 2011-03-12 MED ORDER — NALOXONE HCL 0.4 MG/ML IJ SOLN: 0.4000 mg | INTRAMUSCULAR | Status: DC | PRN

## 2011-03-12 MED ORDER — CHLORHEXIDINE GLUCONATE 0.12 % MT SOLN: 15.0000 mL | Freq: Two times a day (BID) | OROMUCOSAL | Status: DC

## 2011-03-12 MED ORDER — ACETAMINOPHEN 10 MG/ML IV SOLN
1000.0000 mg | Freq: Four times a day (QID) | INTRAVENOUS | Status: DC | PRN
  Filled 2011-03-12: qty 100

## 2011-03-12 MED ORDER — NOREPINEPHRINE BITARTRATE 1 MG/ML IJ SOLN
2.0000 ug/min | INTRAVENOUS | Status: DC
  Filled 2011-03-12: qty 4

## 2011-03-12 MED ORDER — DEXTROSE-NACL 5-0.45 % IV SOLN: INTRAVENOUS | Status: DC

## 2011-03-12 MED ORDER — ACETAMINOPHEN 650 MG RE SUPP: 650.0000 mg | Freq: Four times a day (QID) | RECTAL | Status: DC | PRN

## 2011-03-12 MED ORDER — DOCUSATE SODIUM 283 MG RE ENEM
1.0000 | ENEMA | RECTAL | Status: DC | PRN
  Filled 2011-03-12: qty 1

## 2011-03-12 MED ORDER — CALCIUM CARBONATE 1250 MG/5ML PO SUSP
500.0000 mg | Freq: Four times a day (QID) | ORAL | Status: DC | PRN
  Filled 2011-03-12: qty 5

## 2011-03-12 MED ORDER — INSULIN ASPART 100 UNIT/ML ~~LOC~~ SOLN
0.0000 [IU] | SUBCUTANEOUS | Status: DC
  Filled 2011-03-12: qty 3

## 2011-03-12 MED ORDER — HALOPERIDOL LACTATE 5 MG/ML IJ SOLN: 2.0000 mg | INTRAMUSCULAR | Status: DC | PRN

## 2011-03-12 MED ORDER — SORBITOL 70 % SOLN
30.0000 mL | Status: DC | PRN
  Filled 2011-03-12: qty 30

## 2011-03-12 MED ORDER — PANTOPRAZOLE SODIUM 40 MG IV SOLR: 40.0000 mg | Freq: Two times a day (BID) | INTRAVENOUS | Status: DC

## 2011-03-13 LAB — CROSSMATCH: Unit division: 0

## 2011-03-13 LAB — GLUCOSE, CAPILLARY

## 2011-03-13 LAB — BASIC METABOLIC PANEL
GFR calc Af Amer: 21 mL/min — ABNORMAL LOW (ref >90)
GFR calc non Af Amer: 18 mL/min — ABNORMAL LOW (ref >90)
Glucose, Bld: 107 mg/dL — ABNORMAL HIGH (ref 70–99)
Potassium: 3 mEq/L — ABNORMAL LOW (ref 3.5–5.1)
Sodium: 129 mEq/L — ABNORMAL LOW (ref 135–145)

## 2011-03-13 LAB — CBC
Hemoglobin: 7.7 g/dL — ABNORMAL LOW (ref 13.0–17.0)
MCHC: 33.9 g/dL (ref 30.0–36.0)

## 2011-03-14 LAB — GLUCOSE, CAPILLARY: Glucose-Capillary: 118 mg/dL — ABNORMAL HIGH (ref 70–99)

## 2011-03-16 NOTE — Discharge Summary (Signed)
NAMEMONTEY, EBEL NO.:  [id number]  MEDICAL RECORD NO.:  [id number]  LOCATION:  2116                         FACILITY:  [facility]  PHYSICIAN:  Mary Sella. Andrey Campanile, MD     DATE OF BIRTH:  January 09, 1946  DATE OF ADMISSION:  02/20/2011 DATE OF DISCHARGE:                        DISCHARGE SUMMARY - REFERRING   ADDENDUM  Please make note that this is a surgical addendum to discharge summary done by Critical Care Medicine.  HOSPITAL COURSE:  From a surgical perspective, on Mr. Muzzy is a 65 year old male who underwent an exploratory laparotomy with primary over-sew of a perforated duodenal ulcer by Dr. Lovell Sheehan at [facility] with a Cheree Ditto patch repair over the primary closure.  Postoperatively, the patient went into acute renal failure with multi-system organ failure.  He was transferred to [facility] for dialysis.  We were asked to evaluate the patient upon arrival for postoperative management of surgical issues.  Upon arrival, it was noted that the patient likely had bilious drainage into the JP drain that was placed after his first surgery.  After several days, the patient continued to worsen with an elevated white blood cell count and worsening multi-system organ failure.  Due to copious amounts of bilious drainage, repeat surgical intervention was completed.  This was done on February 25, 2011.  The patient underwent an exploratory laparotomy with lysis of adhesions and evacuation of ascites with a redo of a true Graham patch closure of a pyloric channel ulcer.  He then had a closure of his abdominal wall with fascial retention sutures.  Two JP drains were criss-cross and overlaid over these 2 Graham patch closure.  A pyloric exclusion was felt to not be able to be done at this time given the extensive amount of adhesions and due to the length of operation given the patient's critical status. Postoperatively from this the patient did begin to improve.  He  was taken off dialysis and was ultimately extubated.  From postoperative day 1 on the patient continued to have bilious drainage from both of his JP drains.  It was assumed at this time that his Cheree Ditto patch once again failed.  It was noted during surgery that the patient's perforation in the pyloric channel was quite large over 1 cm in size.  Over the next several days, the patient's drainage output did begin to decrease some. He was not re-operated on.  Burgess Estelle, which was March 10, 2011, upon evaluation of his abdomen, he was noted to have bilious drainage on his gauze from his wound.  This wound was open with retention bars otherwise in place.  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  A Red rubber catheter was laid in this wound with packing around it.  The Red rubber catheter was hooked to suction given the amount of output for better control. The patient was then transferred to [facility] for further care.  DISCHARGE DIAGNOSES FROM A SURGERY STANDPOINT: 1. Perforated pyloric channel ulcer status post exploratory laparotomy     with primary closure and  Graham patch. 2. Status post repeat exploratory laparotomy with true Cheree Ditto patch     with no primary closure and placement of     JP drains. 3. Continued bile leak from perforated ulcer, now with enterocutaneous     fistula to the superior portion of the patient's open abdominal     wound (please put of note, fascia is closed just subcutaneous skin     is open).     Letha Cape, PA   ______________________________ Mary Sella. Andrey Campanile, MD    KEO/MEDQ  D:  03/11/2011  T:  03/11/2011  Job:  [id number]  cc:   Oley Balm. Sung Amabile, MD

## 2011-03-17 NOTE — Discharge Summary (Signed)
Jason Holt, Jason Holt NO.:  [id number]  MEDICAL RECORD NO.:  [id number]  LOCATION:  2116                         FACILITY:  [facility]  PHYSICIAN:  Oley Balm. Sung Amabile, MD   DATE OF BIRTH:  1945-12-31  DATE OF ADMISSION:  02/20/2011 DATE OF DISCHARGE:  03/12/2011                              DISCHARGE SUMMARY   ADMISSION DIAGNOSES: 1. Septic shock due to peritonitis and duodenal perforation. 2. Status post exploratory laparotomy on February 17, 2011 for     perforated duodenal ulcer. 3. Ventilator-dependent respiratory failure. 4. Acute renal failure. 5. Hyperglycemia. 6. Anemia.  DISCHARGE DIAGNOSES: 1. Septic shock due to peritonitis and duodenal perforation. 2. Status     post exploratory laparotomy on February 17, 2011 for perforated     duodenal ulcer. 2. Ventilator-dependent respiratory failure. 3. Acute renal failure - hemodialysis catheter placement, February 20, 2011.  Continuous venovenous hemodialysis, February 22, 1999 to     March 10, 2011. 4. Hyperglycemia. 5. Acute blood loss anemia - multiple transfusions of packed red blood     cells. 6. Altered mental status, resolved. 7. Persistent leak of pyloric channel ulcer - status post repeat     exploratory laparotomy, February 25, 2011, with lysis of adhesions,     redo Cheree Ditto patch closure of pyloric channel. 8. Enteric cutaneous fistulae. 9. Severe pain.  HISTORY OF PRESENT ILLNESS:  Please refer to the admission history and physical for this patient's initial presentation.  Briefly, he is a 65- year-old gentleman with a past medical history notable only for depression (home medications included only Zoloft and Naprosyn as needed).  He presented to [facility] on February 17, 2011 with abdominal pain.  X-rays demonstrated pneumoperitoneum.  This was confirmed by a CT scan of the abdomen.  He underwent laparotomy by Dr. Franky Macho at the [facility].  His postoperative course  was complicated by respiratory failure, renal failure, shock, and anemia. He was transferred to [facility] on February 20, 2011 under the care of the Pulmonary/Critical Care Medicine Service with the above admitting diagnoses.  HOSPITAL COURSE:  He was treated with early goal-directed therapy.  A dialysis catheter was placed on February 20, 2011, and continuous venovenous hemodialysis was initiated on February 22, 2011.  He was treated with broad-spectrum antibiotics, initially vancomycin, Zosyn, fluconazole.  Subsequently, these antibiotics were changed to include imipenem and micafungin.  He underwent extubation on February 23, 2011. After extubation, he was noted to have severe agitated delirium and altered mental status.  He underwent CT scan of the head on February 25, 2011, which demonstrated only remote lacunar infarcts with no acute changes.  He also underwent CT scan of the chest on February 25, 2011, which revealed persistent upper abdominal leak of enteral contrast.  On that date, he went back to the operating room under the care of Dr. Darnell Level.  He was found to have a perforated pyloric channel ulcer.  He underwent lysis of adhesions, and a redo Graham patch closure of the pyloric channel.  Retention sutures were placed.  He returned to the intensive care unit, critically ill and remained  intubated postoperatively.  He was ultimately extubated on March 09, 2011, and has remained off the ventilator without respiratory distress since that time.  He continues to have a 2-French Blake drains in place, draining a large amount of bilious drainage.  He also has evidence of enterocutaneous fistula for which there is no surgical option at the present time.  He has remained on continuous dialysis until March 10, 2011.  This has been discontinued and plans are for intermittent hemodialysis henceforth.  He has required multiple transfusions of packed red blood cells  for ICU-acquired anemia with a component of acute blood loss.  CULTURE RESULTS:  Blood cultures on February 19, 2011 were negative. Repeat blood cultures on February 26, 2011 were again negative. Respiratory culture from February 21, 2011 revealed no growth.  Repeat respiratory culture on February 26, 2011 revealed no growth.  Drainage from the [place] drain on February 26, 2011 grew out yeast.  ANTIBIOTICS:  He was on Zosyn from February 17, 2011 to February 26, 2011; fluconazole February 17, 2011 to February 23, 2011; vancomycin February 17, 2011 to March 09, 2011; imipenem February 26, 2011 to March 11, 2011; micafungin February 26, 2011 to March 14, 2011 (planned).  CURRENT LINES:  Include, only a tunneled hemodialysis catheter and a left IJ central venous catheter, which was placed on March 04, 2011. ICU best practices that included sequential compression devices for DVT prophylaxis.  He has been on high-dose proton pump inhibitor since his admission.  CONDITION ON DISCHARGE:  He remains critically ill, but in a hemodynamically stable state.  His respiratory status has been stable since extubation on March 09, 2011.  He continued to have persistence of anuric renal failure, which will require ongoing dialysis.  He continues to have an open abdomen with retention sutures, [place] Blake drains, and evidence of enteric cutaneous fistulae.  Overall, since his admission, he is markedly improved despite his ongoing medical issues. 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     Oley Balm Sung Amabile, MD     DBS/MEDQ  D:  03/11/2011  T:  03/12/2011  Job:  [id number]  Electronically Signed by Billy Fischer MD on 03/17/2011 02:23:12 PM

## 2011-03-26 LAB — FUNGUS CULTURE W SMEAR

## 2013-01-05 IMAGING — CR DG ABDOMEN ACUTE W/ 1V CHEST
3 series · 3 of 3 positions shown · non-contrast
Comparison: None.

CLINICAL DATA: Abdominal pain and GI bleed.

ACUTE ABDOMEN SERIES (ABDOMEN 2 VIEW & CHEST 1 VIEW)

[view not recorded (1 of 3)]
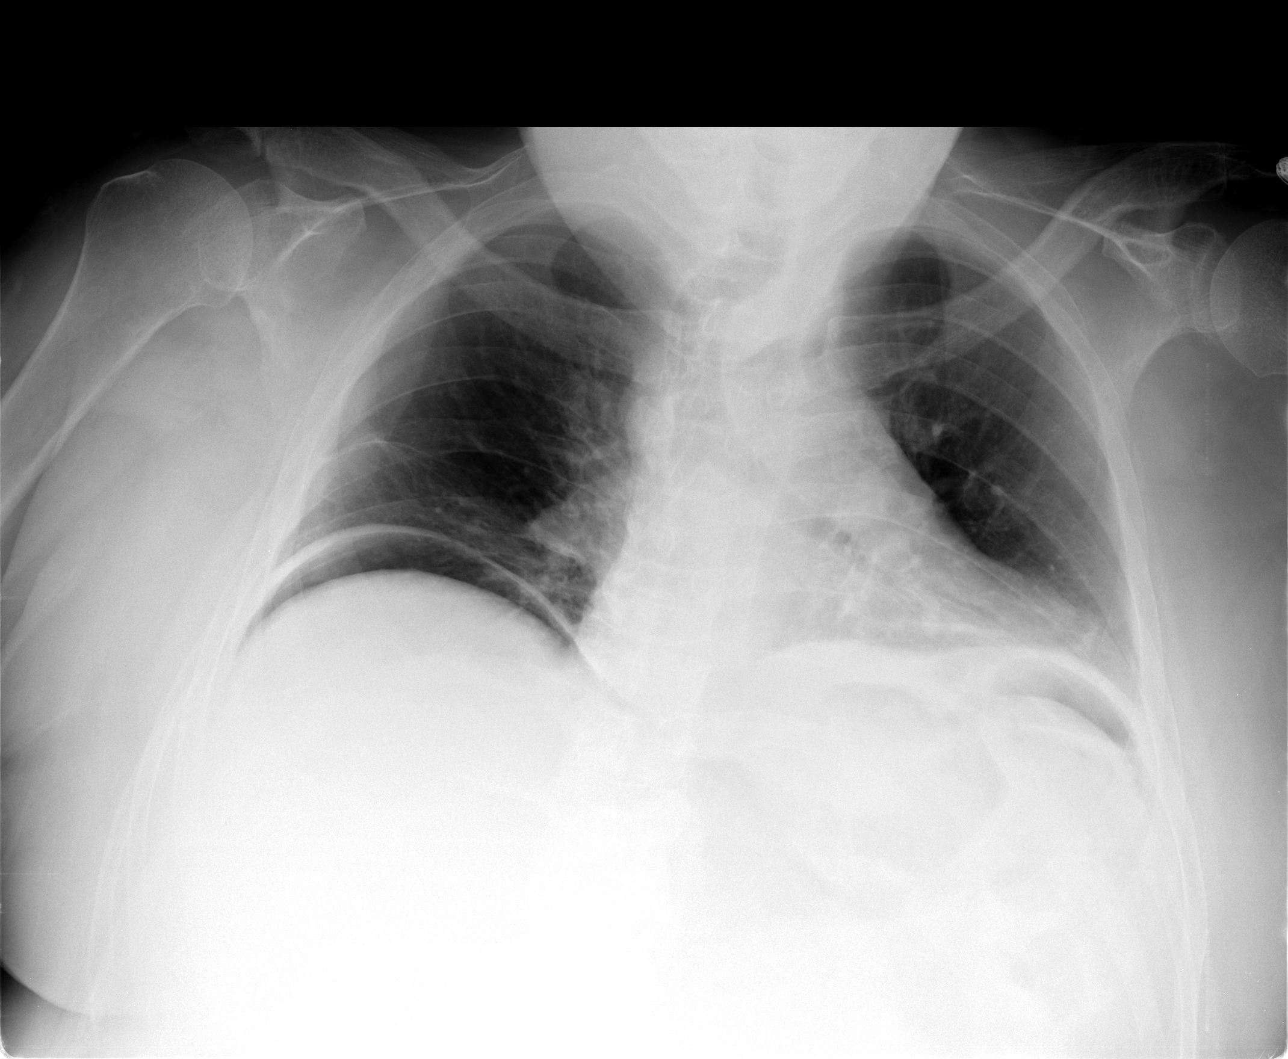

[view not recorded (2 of 3)]
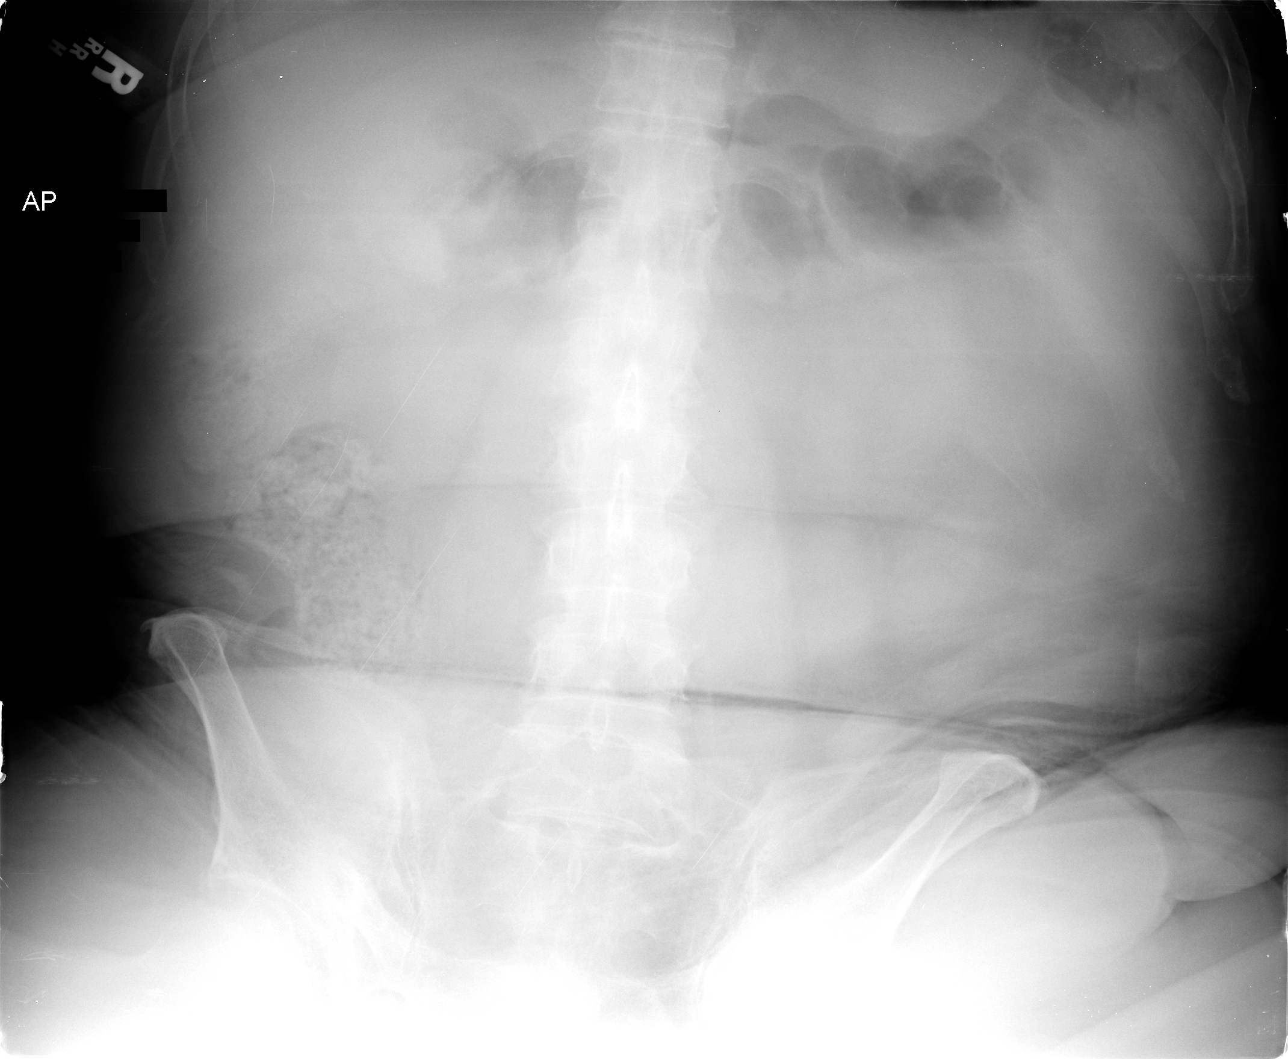

[view not recorded (3 of 3)]
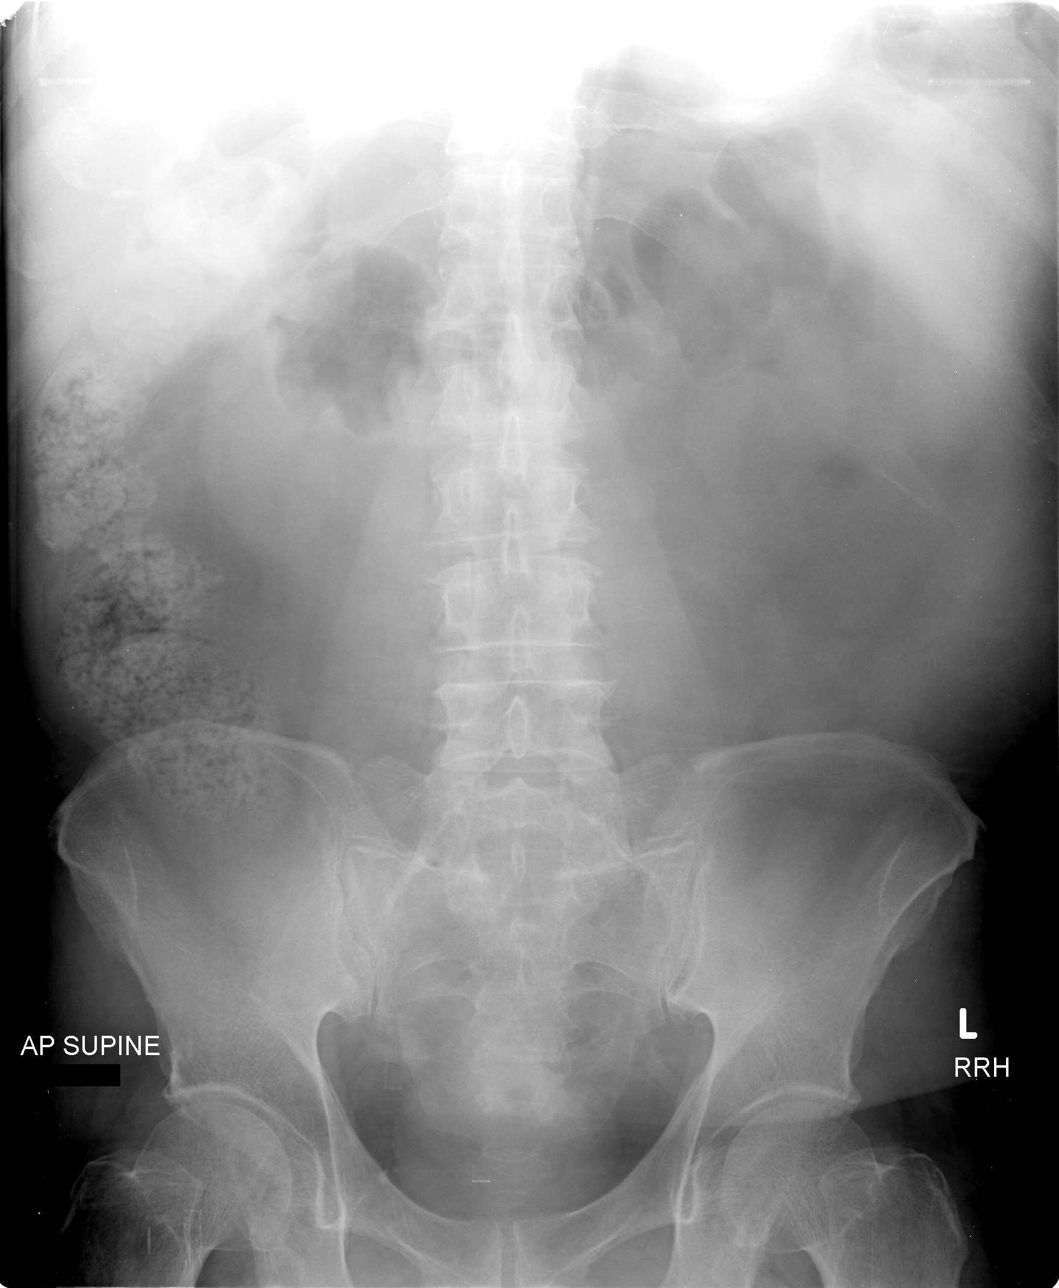

[3 of 3 positions shown; findings below may reference images not displayed]

FINDINGS: Frontal view of the chest shows midline trachea.  Heart
size is accentuated by low lung volumes and AP technique.  Linear
densities are seen in both lung bases.  Lucencies are seen beneath
the hemidiaphragms bilaterally.

Two views of the abdomen show gas and stool in the colon.  No small
bowel dilatation.
IMPRESSION: 1.  Pneumoperitoneum. Critical Value/emergent results were called
by telephone at the time of interpretation on 02/17/2011  at 7971
hours  to  Dr. Simpatico, who verbally acknowledged these results.
2.  Bibasilar atelectasis and/or scarring.

## 2013-01-05 IMAGING — CT CT ABD-PELV W/ CM
2 of 5 series · 15 of 46 positions shown, 17 images · IV contrast (Omnipaque 300)
Comparison: Plain films earlier in the day.

CLINICAL DATA: Fatigue.  Abdominal pain.  Evaluate for intestinal
perforation.  Abdominal pain.  Nausea.

CT ABDOMEN AND PELVIS WITH CONTRAST
TECHNIQUE: Multidetector CT imaging of the abdomen and pelvis was
performed following the standard protocol during bolus
administration of intravenous contrast.
Contrast: 100mL OMNIPAQUE IOHEXOL 300 MG/ML IV SOLN

[Series 2: abd_pel_with 5.0 b40f · axial · 0.83mm/px · z∈[-423,-8]mm · 12 of 97 slices shown, 14 images]
[im 7/97  soft-tissue]
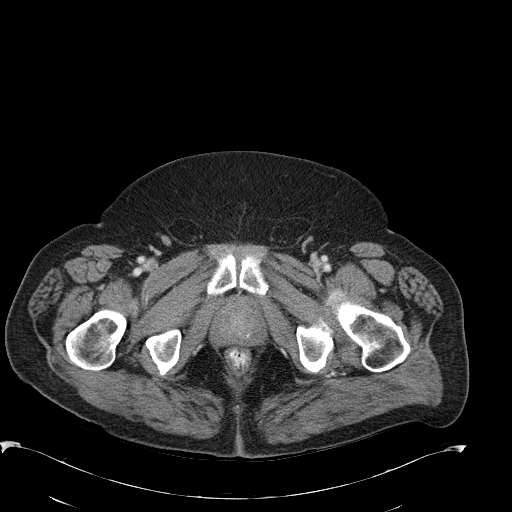
[im 7/97  bone]
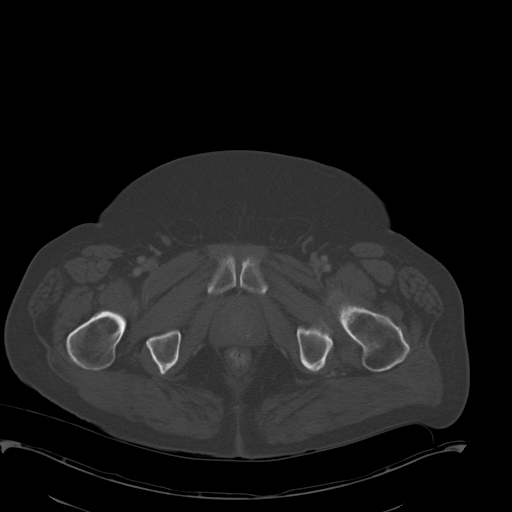
[im 14/97  soft-tissue]
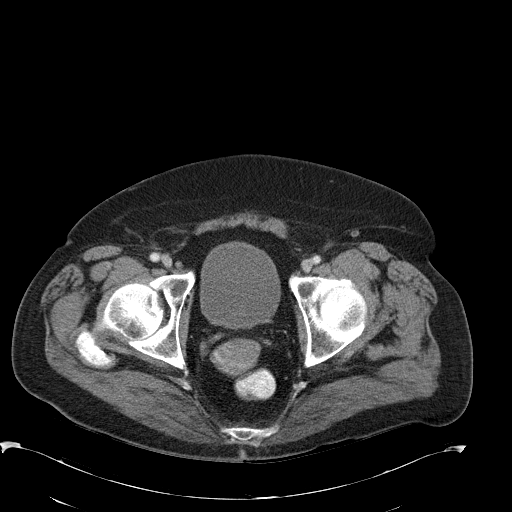
[im 21/97  soft-tissue]
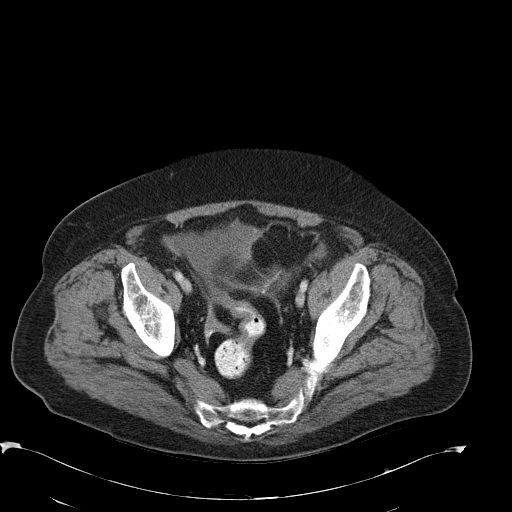
[im 28/97  soft-tissue]
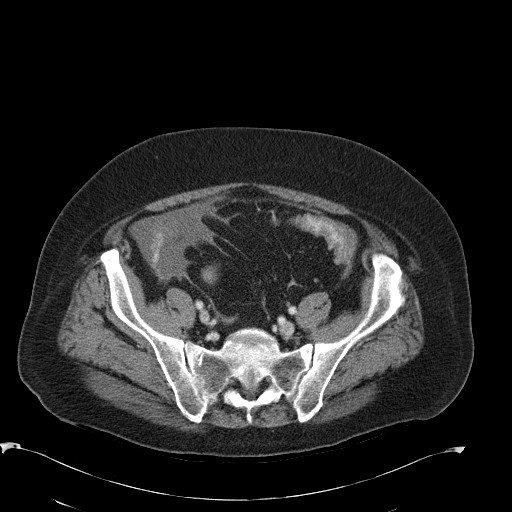
[im 35/97  soft-tissue]
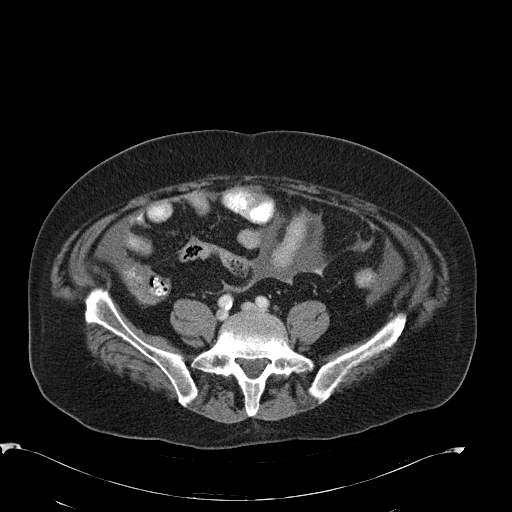
[im 42/97  soft-tissue]
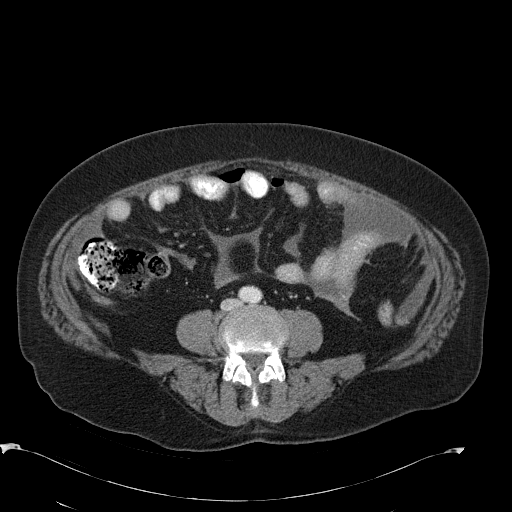
[im 55/97  soft-tissue]
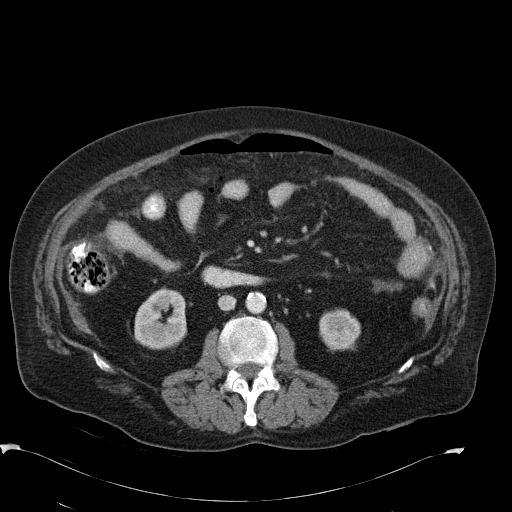
[im 62/97  soft-tissue]
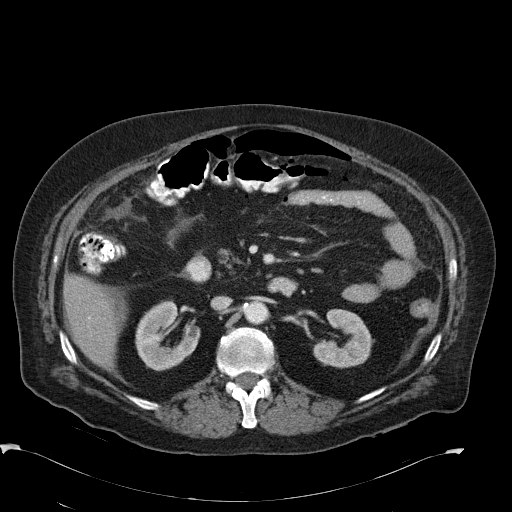
[im 69/97  soft-tissue]
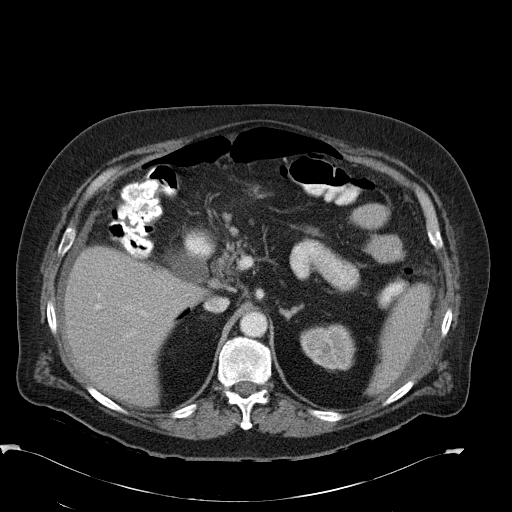
[im 69/97  bone]
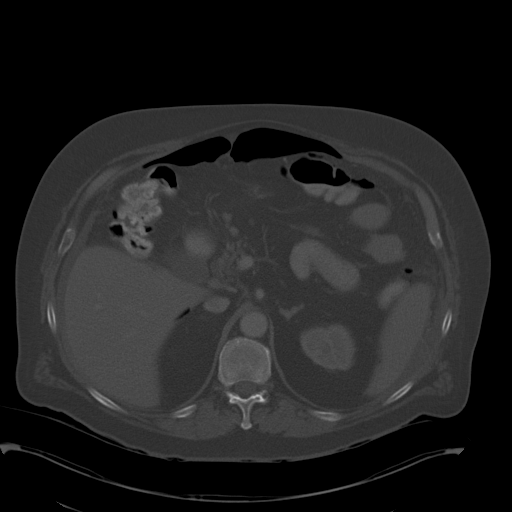
[im 76/97  soft-tissue]
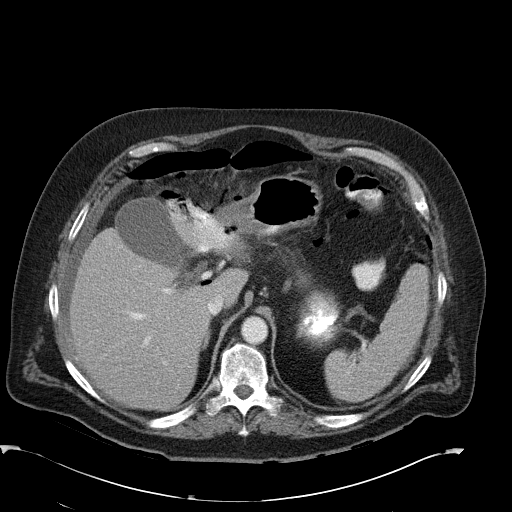
[im 83/97  soft-tissue]
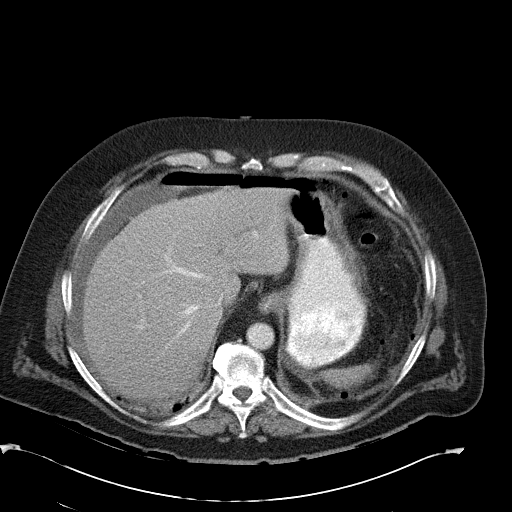
[im 90/97  soft-tissue]
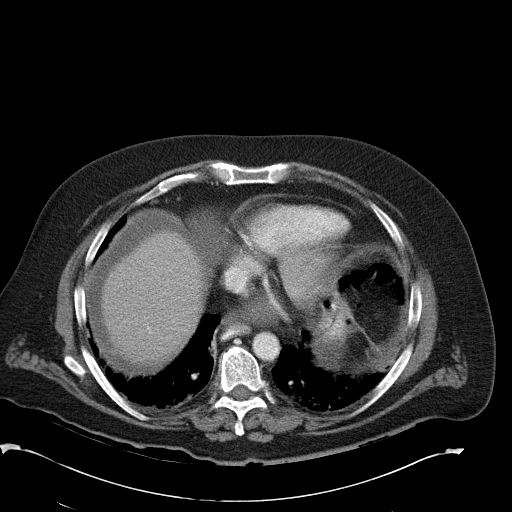

[Series 4: abd_pel_with 3.0 spo cor · coronal · 0.63mm/px · 3 of 84 slices shown]
[im 28/84  soft-tissue]
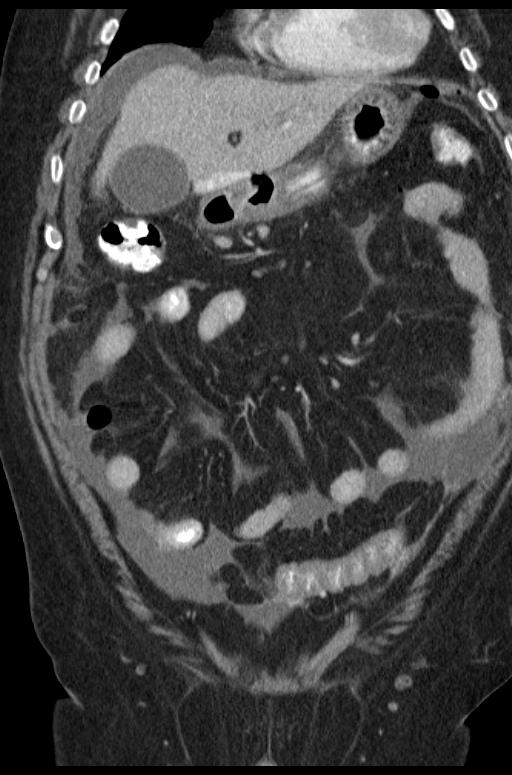
[im 37/84  soft-tissue]
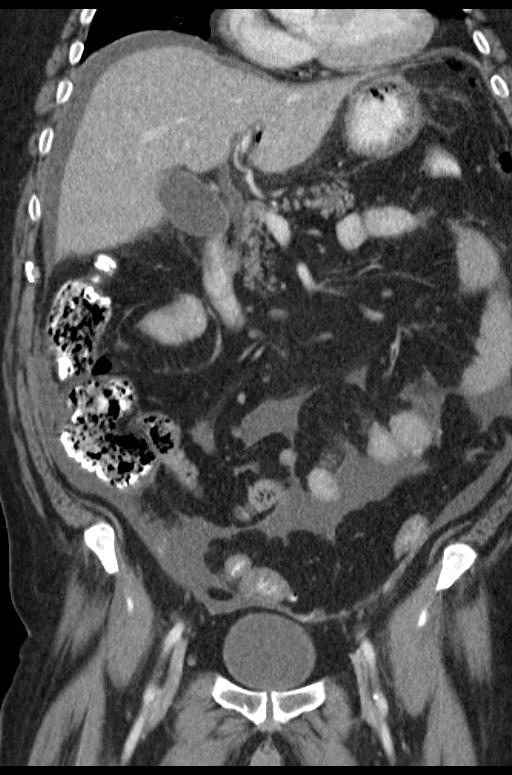
[im 47/84  soft-tissue]
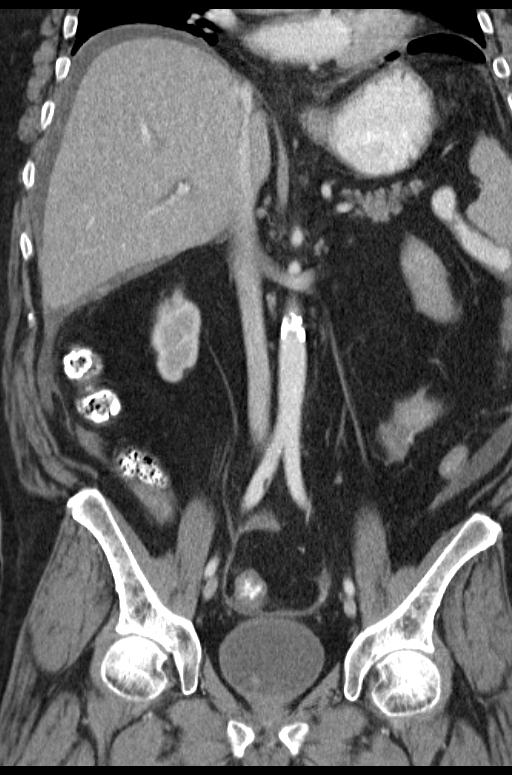

[15 of 46 positions shown; findings below may reference images not displayed]

FINDINGS: Clear lung bases.  Mild cardiomegaly without pericardial
or pleural effusion.  Suspect mild hepatic steatosis.  No focal
liver lesion.  Normal spleen.  Wall thickening at the gastric
antrum and pylorus.  Free spill of contrast along the left lobe of
the liver with extensive free intraperitoneal air.  Former finding
on image 21. Contrast spill on image 31 coronal from the
antropyloric region. Adjacent gastrocolic ligament nodes measure up
to 8 mm on image 31.

No evidence of abscess.  Normal pancreas, gallbladder, biliary
tract, adrenal glands.  Lower pole right renal too small to
characterize lesion, likely a cyst.  Mild bilateral renal cortical
thinning.  Other too small to characterize bilateral renal lesions.
No retroperitoneal or retrocrural adenopathy.

Extensive colonic diverticulosis.  Normal terminal ileum and
appendix.  Small volume perihepatic and perisplenic fluid and
contrast

Normal caliber of small bowel loops.

Fat containing bilateral inguinal hernias. No pelvic adenopathy.
Normal urinary bladder.  Mild prostatomegaly, with prominence of
the median lobe.  Fluid and extra enteric contrast in the cul-de-
sac on image 82.  Small volume.
No acute osseous abnormality.
IMPRESSION: 1.  Free intraperitoneal air is secondary to perforation of the
antropyloric region.  Underlying wall thickening could be related
to gastritis/duodenitis or ulcer.
2.  Small volume abdominal and pelvic extraenteric fluid/contrast.
3.  Small nodes in the gastrocolic ligament.  Likely reactive.
Recommend attention on follow-up.

## 2013-01-06 IMAGING — CR DG CHEST 1V PORT
1 series · 1 of 1 positions shown · non-contrast
Comparison: 02/17/2011

CLINICAL DATA: Status post intubation for respiratory failure.

PORTABLE CHEST - 1 VIEW

[view not recorded]
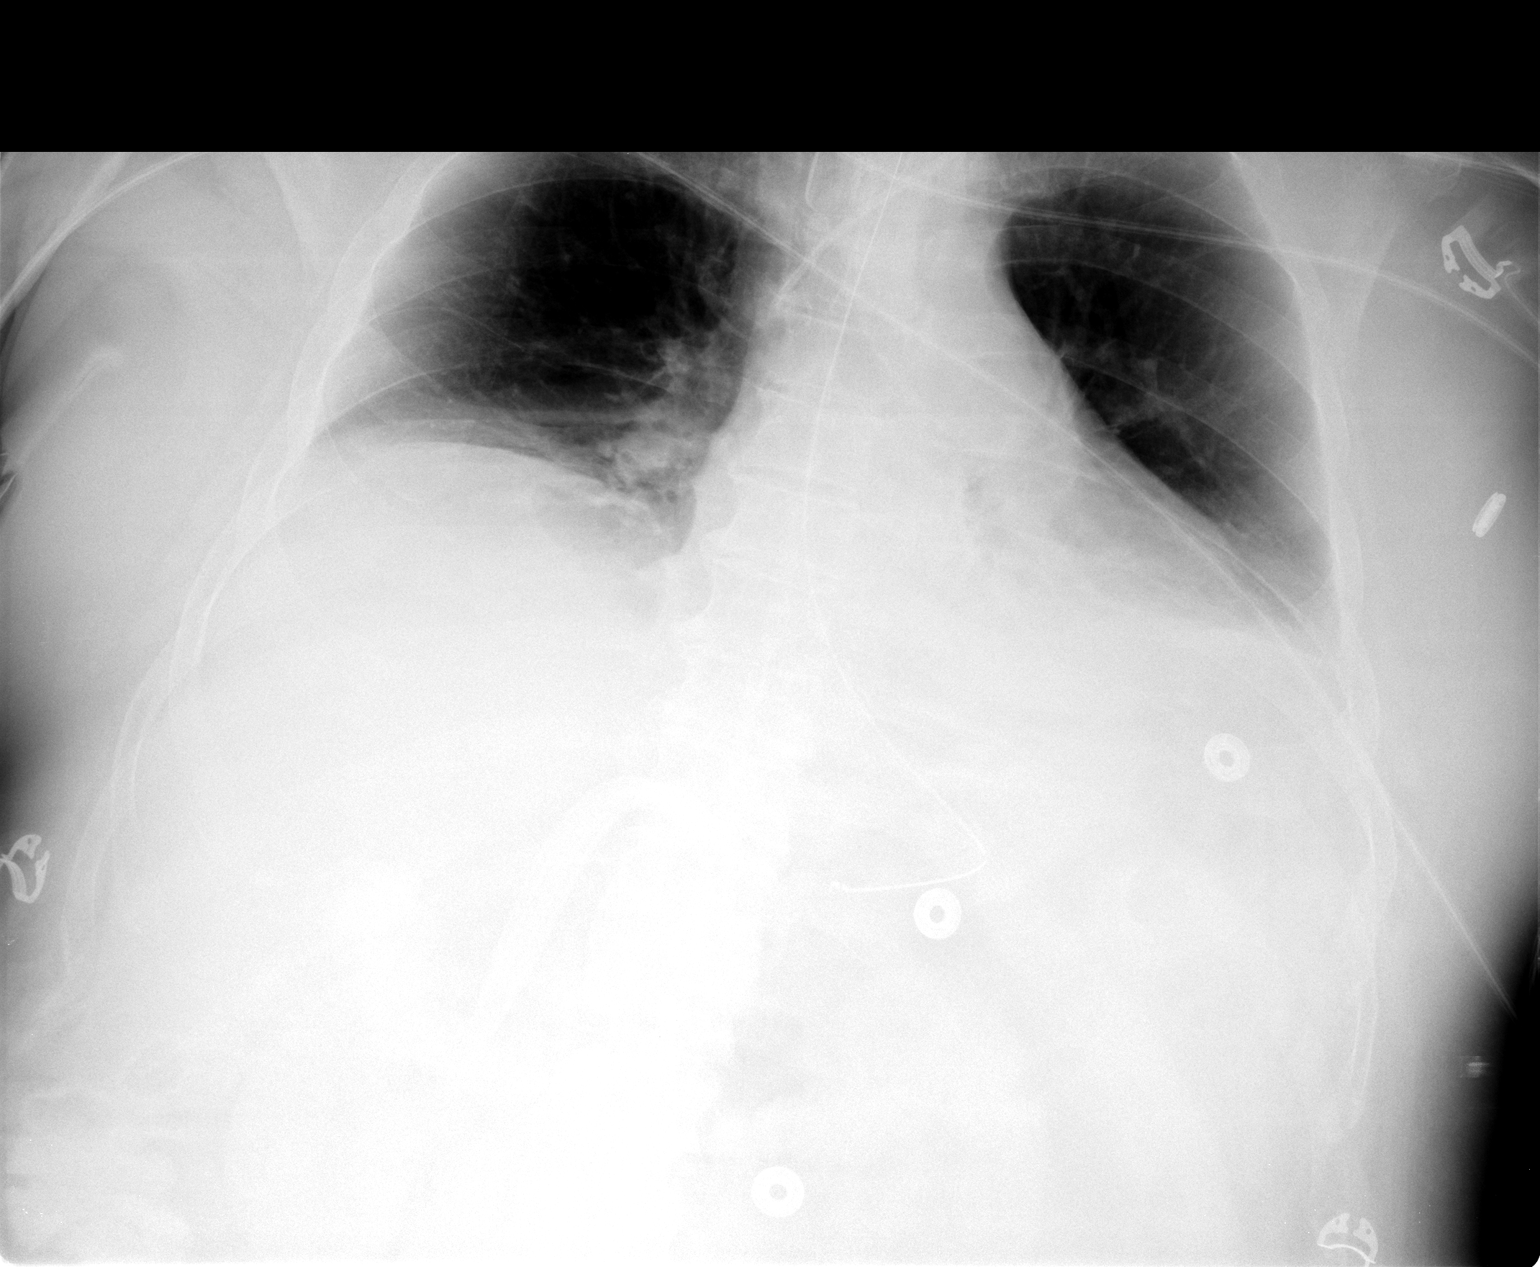

[1 of 1 positions shown; findings below may reference images not displayed]

FINDINGS: Endotracheal tube present with tip approximately 3 cm
above the carina.  Lung volumes are extremely low bilaterally with
bibasilar atelectasis present.  No evidence of pneumothorax, edema
or pleural effusion.  A nasogastric tube extends into the stomach.
Central line is in stable position.  Surgical drain visible in the
upper abdomen.
IMPRESSION: Endotracheal tube tip approximately 3 cm above the carina.  Lungs
show low volumes with bibasilar atelectasis.

## 2013-08-15 ENCOUNTER — Ambulatory Visit: Payer: Self-pay | Admitting: Nurse Practitioner

## 2013-08-15 ENCOUNTER — Ambulatory Visit: Payer: Self-pay

## 2013-08-15 ENCOUNTER — Encounter (INDEPENDENT_AMBULATORY_CARE_PROVIDER_SITE_OTHER): Payer: Self-pay

## 2013-08-15 VITALS — BP 121/62 | HR 56 | Temp 97.7°F | Ht 69.0 in | Wt 188.0 lb

## 2013-08-15 DIAGNOSIS — M25569 Pain in unspecified knee: Secondary | ICD-10-CM

## 2013-08-15 DIAGNOSIS — K219 Gastro-esophageal reflux disease without esophagitis: Secondary | ICD-10-CM | POA: Insufficient documentation

## 2013-08-15 DIAGNOSIS — M25561 Pain in right knee: Secondary | ICD-10-CM

## 2013-08-15 DIAGNOSIS — M25562 Pain in left knee: Principal | ICD-10-CM

## 2013-08-15 MED ORDER — TRAMADOL HCL 50 MG PO TABS: 50.0000 mg | ORAL_TABLET | Freq: Three times a day (TID) | ORAL | Status: DC | PRN

## 2013-08-15 NOTE — Patient Instructions (Signed)
Knee Pain Knee pain can be a result of an injury or other medical conditions. Treatment will depend on the cause of your pain. HOME CARE  Only take medicine as told by your doctor.  Keep a healthy weight. Being overweight can make the knee hurt more.  Stretch before exercising or playing sports.  If there is constant knee pain, change the way you exercise. Ask your doctor for advice.  Make sure shoes fit well. Choose the right shoe for the sport or activity.  Protect your knees. Wear kneepads if needed.  Rest when you are tired. GET HELP RIGHT AWAY IF:   Your knee pain does not stop.  Your knee pain does not get better.  Your knee joint feels hot to the touch.  You have a fever. MAKE SURE YOU:   Understand these instructions.  Will watch this condition.  Will get help right away if you are not doing well or get worse. Document Released: 07/23/2008 Document Revised: 07/19/2011 Document Reviewed: 07/23/2008 [employer] Patient Information 2014 [employer].  
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
## 2013-09-13 ENCOUNTER — Emergency Department (HOSPITAL_COMMUNITY): Payer: Non-veteran care

## 2013-09-13 ENCOUNTER — Encounter (HOSPITAL_COMMUNITY): Payer: Self-pay | Admitting: Emergency Medicine

## 2013-09-13 ENCOUNTER — Emergency Department (HOSPITAL_COMMUNITY)
Admission: EM | Admit: 2013-09-13 | Discharge: 2013-09-13 | Disposition: A | Discharge status: Home / Self Care | Payer: Non-veteran care | Attending: Emergency Medicine | Admitting: Emergency Medicine

## 2013-09-13 DIAGNOSIS — F329 Major depressive disorder, single episode, unspecified: Secondary | ICD-10-CM | POA: Insufficient documentation

## 2013-09-13 DIAGNOSIS — Y92410 Unspecified street and highway as the place of occurrence of the external cause: Secondary | ICD-10-CM | POA: Insufficient documentation

## 2013-09-13 DIAGNOSIS — Z9889 Other specified postprocedural states: Secondary | ICD-10-CM | POA: Insufficient documentation

## 2013-09-13 DIAGNOSIS — S60229A Contusion of unspecified hand, initial encounter: Secondary | ICD-10-CM | POA: Insufficient documentation

## 2013-09-13 DIAGNOSIS — S8010XA Contusion of unspecified lower leg, initial encounter: Secondary | ICD-10-CM | POA: Insufficient documentation

## 2013-09-13 DIAGNOSIS — F3289 Other specified depressive episodes: Secondary | ICD-10-CM | POA: Insufficient documentation

## 2013-09-13 DIAGNOSIS — S8011XA Contusion of right lower leg, initial encounter: Secondary | ICD-10-CM

## 2013-09-13 DIAGNOSIS — Y9389 Activity, other specified: Secondary | ICD-10-CM | POA: Insufficient documentation

## 2013-09-13 DIAGNOSIS — S60222A Contusion of left hand, initial encounter: Secondary | ICD-10-CM

## 2013-09-13 MED ORDER — OXYCODONE-ACETAMINOPHEN 5-325 MG PO TABS: 1.0000 | ORAL_TABLET | ORAL | Status: DC | PRN

## 2013-09-13 MED ORDER — DOUBLE ANTIBIOTIC 500-10000 UNIT/GM EX OINT
TOPICAL_OINTMENT | Freq: Once | CUTANEOUS | Status: AC
  Administered 2013-09-13: 1 via TOPICAL
  Filled 2013-09-13: qty 1

## 2013-09-13 MED ORDER — HYDROCODONE-ACETAMINOPHEN 5-325 MG PO TABS
2.0000 | ORAL_TABLET | Freq: Once | ORAL | Status: AC
  Administered 2013-09-13: 2 via ORAL
  Filled 2013-09-13: qty 2

## 2013-09-13 NOTE — ED Notes (Signed)
Departure condition documented on wrong pt
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
## 2013-09-13 NOTE — Discharge Instructions (Signed)
X-rays show no fracture.   A simple dressing to hand abrasion.  Recommend Neosporin ointment. You'll be sore for several days. Pain medication.
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
## 2016-08-30 ENCOUNTER — Emergency Department (HOSPITAL_COMMUNITY)
Admission: EM | Admit: 2016-08-30 | Discharge: 2016-08-30 | Disposition: A | Discharge status: Home / Self Care | Payer: Non-veteran care | Attending: Emergency Medicine | Admitting: Emergency Medicine

## 2016-08-30 ENCOUNTER — Emergency Department (HOSPITAL_COMMUNITY): Payer: Non-veteran care

## 2016-08-30 ENCOUNTER — Encounter (HOSPITAL_COMMUNITY): Payer: Self-pay | Admitting: Emergency Medicine

## 2016-08-30 DIAGNOSIS — N4889 Other specified disorders of penis: Secondary | ICD-10-CM | POA: Insufficient documentation

## 2016-08-30 DIAGNOSIS — R197 Diarrhea, unspecified: Secondary | ICD-10-CM | POA: Diagnosis not present

## 2016-08-30 DIAGNOSIS — R3912 Poor urinary stream: Secondary | ICD-10-CM | POA: Insufficient documentation

## 2016-08-30 DIAGNOSIS — I10 Essential (primary) hypertension: Secondary | ICD-10-CM | POA: Insufficient documentation

## 2016-08-30 DIAGNOSIS — R1030 Lower abdominal pain, unspecified: Secondary | ICD-10-CM | POA: Insufficient documentation

## 2016-08-30 DIAGNOSIS — R300 Dysuria: Secondary | ICD-10-CM | POA: Diagnosis not present

## 2016-08-30 DIAGNOSIS — R319 Hematuria, unspecified: Secondary | ICD-10-CM | POA: Diagnosis not present

## 2016-08-30 DIAGNOSIS — Z79899 Other long term (current) drug therapy: Secondary | ICD-10-CM | POA: Diagnosis not present

## 2016-08-30 DIAGNOSIS — R109 Unspecified abdominal pain: Secondary | ICD-10-CM

## 2016-08-30 HISTORY — DX: Essential (primary) hypertension: I10

## 2016-08-30 HISTORY — DX: Gastro-esophageal reflux disease without esophagitis: K21.9

## 2016-08-30 HISTORY — DX: Pain in unspecified knee: M25.569

## 2016-08-30 HISTORY — DX: Other chronic pain: G89.29

## 2016-08-30 LAB — BASIC METABOLIC PANEL
Anion gap: 11 (ref 5–15)
BUN: 28 mg/dL — ABNORMAL HIGH (ref 6–20)
CHLORIDE: 105 mmol/L (ref 101–111)
CO2: 24 mmol/L (ref 22–32)
Calcium: 9.5 mg/dL (ref 8.9–10.3)
Creatinine, Ser: 0.83 mg/dL (ref 0.61–1.24)
GFR calc non Af Amer: >60 mL/min (ref >60)
Glucose, Bld: 108 mg/dL — ABNORMAL HIGH (ref 65–99)
POTASSIUM: 3.6 mmol/L (ref 3.5–5.1)
SODIUM: 140 mmol/L (ref 135–145)

## 2016-08-30 LAB — CBC WITH DIFFERENTIAL/PLATELET
BASOS PCT: 0 %
Basophils Absolute: 0 10*3/uL (ref 0.0–0.1)
EOS ABS: 0 10*3/uL (ref 0.0–0.7)
Eosinophils Relative: 0 %
HCT: 51.4 % (ref 39.0–52.0)
HEMOGLOBIN: 17.5 g/dL — AB (ref 13.0–17.0)
Lymphocytes Relative: 9 %
Lymphs Abs: 1 10*3/uL (ref 0.7–4.0)
MCH: 30.3 pg (ref 26.0–34.0)
MCHC: 34 g/dL (ref 30.0–36.0)
MCV: 88.9 fL (ref 78.0–100.0)
Monocytes Absolute: 0.9 10*3/uL (ref 0.1–1.0)
Monocytes Relative: 8 %
NEUTROS PCT: 83 %
Neutro Abs: 9.3 10*3/uL — ABNORMAL HIGH (ref 1.7–7.7)
Platelets: 240 10*3/uL (ref 150–400)
RBC: 5.78 MIL/uL (ref 4.22–5.81)
RDW: 14.2 % (ref 11.5–15.5)
WBC: 11.2 10*3/uL — ABNORMAL HIGH (ref 4.0–10.5)

## 2016-08-30 LAB — URINALYSIS, ROUTINE W REFLEX MICROSCOPIC
Glucose, UA: NEGATIVE mg/dL
Ketones, ur: 40 mg/dL — AB
Leukocytes, UA: NEGATIVE
NITRITE: NEGATIVE
PH: 6 (ref 5.0–8.0)
Protein, ur: 30 mg/dL — AB
SPECIFIC GRAVITY, URINE: 1.025 (ref 1.005–1.030)

## 2016-08-30 LAB — URINALYSIS, MICROSCOPIC (REFLEX)
SQUAMOUS EPITHELIAL / LPF: NONE SEEN
WBC UA: NONE SEEN WBC/hpf (ref 0–5)

## 2016-08-30 MED ORDER — DOCUSATE SODIUM 100 MG PO CAPS: 100.0000 mg | ORAL_CAPSULE | Freq: Two times a day (BID) | ORAL | 0 refills | Status: AC

## 2016-08-30 MED ORDER — HYDROCODONE-ACETAMINOPHEN 5-325 MG PO TABS
2.0000 | ORAL_TABLET | Freq: Once | ORAL | Status: AC
  Administered 2016-08-30: 2 via ORAL
  Filled 2016-08-30: qty 2

## 2016-08-30 MED ORDER — IOPAMIDOL (ISOVUE-300) INJECTION 61%: 100.0000 mL | Freq: Once | INTRAVENOUS | Status: DC | PRN

## 2016-08-30 MED ORDER — HYDROCODONE-ACETAMINOPHEN 5-325 MG PO TABS: 1.0000 | ORAL_TABLET | Freq: Four times a day (QID) | ORAL | 0 refills | Status: DC | PRN

## 2016-08-30 NOTE — ED Provider Notes (Signed)
[facility] DEPT Provider Note   CSN: [id number] Arrival date & time: 08/30/16  1040  By signing my name below, I, Mayer Masker, attest that this documentation has been prepared under the direction and in the presence of Merrily Pew, MD. Electronically Signed: Mayer Masker, Scribe. 08/30/16. 12:32 PM.  History   Chief Complaint Chief Complaint  Patient presents with  . Dysuria   HPI Comments: Jason Holt is a 71 y.o. male with h/o UTI who presents to the Emergency Department complaining of dysuria since 08-23-2016. Symptom worsened on 08-27-2016, so he went to urgent care where he had a UA that showed hematuria.  Pt denies obvious hematuria. He was discharged with antibiotics which have not provided any relief. He states he has associated pain in his penis and suprapubic abdomen, decreased urinary output. He has leg pain but that is not beyond baseline. He denies back pain, constipation, nausea, vomiting, fever, diaphoresis. Pt is not sexually active.  He also notes pain today is similar to pain felt with past UTI 2 years ago. Pt takes hydrocodone for his pain and ran through 1 months supply in ~2 weeks due to his pain.   The history is provided by the patient. No language interpreter was used.    Past Medical History:  Diagnosis Date  . Acid reflux   . Chronic knee pain   . Depression   . Hypertension     Patient Active Problem List   Diagnosis Date Noted  . GERD (gastroesophageal reflux disease) 08/15/2013    Past Surgical History:  Procedure Laterality Date  . KNEE SURGERY    . LAPAROTOMY  02/18/2011   Procedure: EXPLORATORY LAPAROTOMY;  Surgeon: Jamesetta So;  Location: [facility];  Service: General;  Laterality: N/A;  Gastrorraphy       Home Medications    Prior to Admission medications   Medication Sig Start Date End Date Taking? Authorizing Provider  Calcium Carb-Cholecalciferol 500-200 MG-UNIT TABS Take 1 tablet by mouth 2 (two) times daily.    Yes  Historical Provider, MD  doxycycline (VIBRAMYCIN) 100 MG capsule Take 1 Capsule by mouth every 12 hours 08/27/16  Yes Historical Provider, MD  omeprazole (PRILOSEC) 20 MG capsule Take 20 mg by mouth daily.   Yes Historical Provider, MD  docusate sodium (COLACE) 100 MG capsule Take 1 capsule (100 mg total) by mouth every 12 (twelve) hours. 08/30/16   Merrily Pew, MD  HYDROcodone-acetaminophen (NORCO) 5-325 MG tablet Take 1 tablet by mouth every 6 (six) hours as needed for severe pain. 08/30/16   Merrily Pew, MD    Family History History reviewed. No pertinent family history.  Social History Social History  Substance Use Topics  . Smoking status: Never Smoker  . Smokeless tobacco: Never Used  . Alcohol use No     Allergies   Eggs or egg-derived products and Influenza virus vacc split pf   Review of Systems Review of Systems  Constitutional: Negative for diaphoresis and fever.  Gastrointestinal: Positive for diarrhea (mild). Negative for constipation, nausea and vomiting.  Genitourinary: Positive for decreased urine volume, difficulty urinating, dysuria, hematuria and penile pain.  Musculoskeletal: Negative for back pain.     Physical Exam Updated Vital Signs BP (!) 155/86   Pulse 69   Temp 98.3 F (36.8 C) (Oral)   Resp 18   Ht 5\' 5"  (1.651 m)   Wt 185 lb (83.9 kg)   SpO2 100%   BMI 30.79 kg/m   Physical Exam  Constitutional:  He is oriented to person, place, and time. He appears well-developed and well-nourished. No distress.  HENT:  Head: Normocephalic and atraumatic.  Eyes: Conjunctivae are normal.  Cardiovascular: Normal rate, regular rhythm, normal heart sounds and intact distal pulses.  Exam reveals no gallop and no friction rub.   No murmur heard. Pulmonary/Chest: Effort normal and breath sounds normal. No respiratory distress. He has no wheezes. He has no rales. He exhibits no tenderness.  Abdominal: Soft. He exhibits no distension. There is tenderness.    Suprapubic tenderness  Genitourinary:  Genitourinary Comments: Palpable bladder Erythema around urethra, swelling No obvious discharge    Neurological: He is alert and oriented to person, place, and time.  Skin: Skin is warm and dry.  Psychiatric: He has a normal mood and affect.  Nursing note and vitals reviewed.    ED Treatments / Results  DIAGNOSTIC STUDIES: Oxygen Saturation is 96% on RA, normal by my interpretation.    COORDINATION OF CARE: 12:32 PM Discussed treatment plan with pt at bedside and pt agreed to plan.   Labs (all labs ordered are listed, but only abnormal results are displayed) Labs Reviewed  URINALYSIS, ROUTINE W REFLEX MICROSCOPIC - Abnormal; Notable for the following:       Result Value   Hgb urine dipstick LARGE (*)    Bilirubin Urine MODERATE (*)    Ketones, ur 40 (*)    Protein, ur 30 (*)    All other components within normal limits  URINALYSIS, MICROSCOPIC (REFLEX) - Abnormal; Notable for the following:    Bacteria, UA FEW (*)    All other components within normal limits  CBC WITH DIFFERENTIAL/PLATELET - Abnormal; Notable for the following:    WBC 11.2 (*)    Hemoglobin 17.5 (*)    Neutro Abs 9.3 (*)    All other components within normal limits  BASIC METABOLIC PANEL - Abnormal; Notable for the following:    Glucose, Bld 108 (*)    BUN 28 (*)    All other components within normal limits  URINE CULTURE    EKG  EKG Interpretation None       Radiology Ct Abdomen Pelvis Wo Contrast  Result Date: 08/30/2016 CLINICAL DATA:  Lower abdominal pressure, pain.  Hematuria. EXAM: CT ABDOMEN AND PELVIS WITHOUT CONTRAST TECHNIQUE: Multidetector CT imaging of the abdomen and pelvis was performed following the standard protocol without IV contrast. COMPARISON:  03/06/2011 FINDINGS: Lower chest: Dense calcifications in the visualized left circumflex and right coronary arteries. Heart is normal size. Lung bases clear. No effusions. Hepatobiliary: No  focal hepatic abnormality. Gallbladder unremarkable. Pancreas: No focal abnormality or ductal dilatation. Spleen: No focal abnormality.  Normal size. Adrenals/Urinary Tract: No adrenal abnormality. No focal renal abnormality. No stones or hydronephrosis. Suggestion of rounded soft tissue mass protruding into the bladder lumen measuring 2.3 cm. Stomach/Bowel: Scattered sigmoid diverticula. No active diverticulitis. Stomach and small bowel decompressed. Vascular/Lymphatic: Aortic and iliac calcifications. No aneurysm or adenopathy. Reproductive: Prostate is mildly prominent. Other: No free fluid or free air. Bilateral inguinal hernias containing fat. Large wide mouthed ventral hernia containing several small bowel loops. No evidence of bowel obstruction. Musculoskeletal: No acute bony abnormality. IMPRESSION: Findings concerning for rounded polypoid soft tissue mass protruding into the bladder lumen. Cannot exclude bladder cancer. Consider further evaluation with cystoscopy. Sigmoid diverticulosis.  No active diverticulitis. Wide ventral hernia containing small bowel loops. No obstruction. Small bilateral inguinal hernias containing fat. Aortoiliac atherosclerosis.  Coronary artery disease. Electronically Signed   By: Lennette Bihari  Dover M.D.   On: 08/30/2016 15:51    Procedures Procedures (including critical care time)  Medications Ordered in ED Medications  iopamidol (ISOVUE-300) 61 % injection 100 mL (not administered)  HYDROcodone-acetaminophen (NORCO/VICODIN) 5-325 MG per tablet 2 tablet (2 tablets Oral Given 08/30/16 1224)     Initial Impression / Assessment and Plan / ED Course  I have reviewed the triage vital signs and the nursing notes.  Pertinent labs & imaging results that were available during my care of the patient were reviewed by me and considered in my medical decision making (see chart for details).     Soft tissue mass in bladder concerning for neoplasm. Wants to see a urologist at [facility],  so he will make those arrangements. In mean time, short course of pain meds given along with colace. Attempted to put in foley but patient apparently refused. Will return for new/worsening symptoms.   Final Clinical Impressions(s) / ED Diagnoses   Final diagnoses:  Hematuria, unspecified type    New Prescriptions New Prescriptions   DOCUSATE SODIUM (COLACE) 100 MG CAPSULE    Take 1 capsule (100 mg total) by mouth every 12 (twelve) hours.   HYDROCODONE-ACETAMINOPHEN (NORCO) 5-325 MG TABLET    Take 1 tablet by mouth every 6 (six) hours as needed for severe pain.  I personally performed the services described in this documentation, which was scribed in my presence. The recorded information has been reviewed and is accurate.     Merrily Pew, MD 08/30/16 [phone]

## 2016-08-30 NOTE — Discharge Instructions (Signed)
You need to follow up with a urologist because of the concern for urinary bladder cancer.  CT shows:   IMPRESSION: Findings concerning for rounded polypoid soft tissue mass protruding into the bladder lumen. Cannot exclude bladder cancer. Consider further evaluation with cystoscopy.  Urinalysis showed large blood but no evidence of infection

## 2016-08-30 NOTE — ED Triage Notes (Signed)
PT c/o pressure to lower abdominal area with blood noted in urine x3 days. PT states no relief from antibiotics prescribed by urgent care on Friday. PT also states he uses chronic hydrocodone for knee pain and has run out of his pain medications due to using them for breakthrough pain from increased urinary frequency and pain.

## 2016-08-30 NOTE — ED Notes (Addendum)
Pt refusing foley cath at this time. Pt does not want one placed in him.

## 2016-08-31 LAB — URINE CULTURE
CULTURE: NO GROWTH
SPECIAL REQUESTS: NORMAL

## 2017-03-24 ENCOUNTER — Emergency Department (HOSPITAL_COMMUNITY): Payer: Non-veteran care

## 2017-03-24 ENCOUNTER — Encounter (HOSPITAL_COMMUNITY): Payer: Self-pay | Admitting: Emergency Medicine

## 2017-03-24 ENCOUNTER — Emergency Department (HOSPITAL_COMMUNITY)
Admission: EM | Admit: 2017-03-24 | Discharge: 2017-03-24 | Disposition: A | Discharge status: Home / Self Care | Payer: Non-veteran care | Attending: Emergency Medicine | Admitting: Emergency Medicine

## 2017-03-24 DIAGNOSIS — N329 Bladder disorder, unspecified: Secondary | ICD-10-CM | POA: Diagnosis not present

## 2017-03-24 DIAGNOSIS — I10 Essential (primary) hypertension: Secondary | ICD-10-CM | POA: Insufficient documentation

## 2017-03-24 DIAGNOSIS — E876 Hypokalemia: Secondary | ICD-10-CM | POA: Insufficient documentation

## 2017-03-24 DIAGNOSIS — G894 Chronic pain syndrome: Secondary | ICD-10-CM

## 2017-03-24 DIAGNOSIS — R531 Weakness: Secondary | ICD-10-CM | POA: Diagnosis present

## 2017-03-24 DIAGNOSIS — Z79899 Other long term (current) drug therapy: Secondary | ICD-10-CM | POA: Insufficient documentation

## 2017-03-24 DIAGNOSIS — G8929 Other chronic pain: Secondary | ICD-10-CM | POA: Diagnosis not present

## 2017-03-24 LAB — CBC WITH DIFFERENTIAL/PLATELET
BASOS ABS: 0 10*3/uL (ref 0.0–0.1)
Basophils Relative: 1 %
Eosinophils Absolute: 0.1 10*3/uL (ref 0.0–0.7)
Eosinophils Relative: 1 %
HCT: 42.4 % (ref 39.0–52.0)
HEMOGLOBIN: 14.2 g/dL (ref 13.0–17.0)
LYMPHS ABS: 1.4 10*3/uL (ref 0.7–4.0)
LYMPHS PCT: 16 %
MCH: 29.2 pg (ref 26.0–34.0)
MCHC: 33.5 g/dL (ref 30.0–36.0)
MCV: 87.2 fL (ref 78.0–100.0)
Monocytes Absolute: 0.8 10*3/uL (ref 0.1–1.0)
Monocytes Relative: 9 %
NEUTROS ABS: 6.2 10*3/uL (ref 1.7–7.7)
NEUTROS PCT: 73 %
Platelets: 289 10*3/uL (ref 150–400)
RBC: 4.86 MIL/uL (ref 4.22–5.81)
RDW: 13.3 % (ref 11.5–15.5)
WBC: 8.5 10*3/uL (ref 4.0–10.5)

## 2017-03-24 LAB — COMPREHENSIVE METABOLIC PANEL
ALT: 14 U/L — ABNORMAL LOW (ref 17–63)
ANION GAP: 8 (ref 5–15)
AST: 34 U/L (ref 15–41)
Albumin: 3.5 g/dL (ref 3.5–5.0)
Alkaline Phosphatase: 81 U/L (ref 38–126)
BUN: 14 mg/dL (ref 6–20)
CHLORIDE: 106 mmol/L (ref 101–111)
CO2: 24 mmol/L (ref 22–32)
Calcium: 8.7 mg/dL — ABNORMAL LOW (ref 8.9–10.3)
Creatinine, Ser: 0.97 mg/dL (ref 0.61–1.24)
Glucose, Bld: 118 mg/dL — ABNORMAL HIGH (ref 65–99)
POTASSIUM: 2.9 mmol/L — AB (ref 3.5–5.1)
Sodium: 138 mmol/L (ref 135–145)
Total Bilirubin: 0.8 mg/dL (ref 0.3–1.2)
Total Protein: 6.2 g/dL — ABNORMAL LOW (ref 6.5–8.1)

## 2017-03-24 LAB — MAGNESIUM: Magnesium: 2 mg/dL (ref 1.7–2.4)

## 2017-03-24 MED ORDER — POTASSIUM CHLORIDE 10 MEQ/100ML IV SOLN
10.0000 meq | INTRAVENOUS | Status: DC
  Administered 2017-03-24: 10 meq via INTRAVENOUS
  Filled 2017-03-24 (×2): qty 100

## 2017-03-24 MED ORDER — HYDROCODONE-ACETAMINOPHEN 5-325 MG PO TABS: 1.0000 | ORAL_TABLET | ORAL | 0 refills | Status: DC | PRN

## 2017-03-24 MED ORDER — HYDROCODONE-ACETAMINOPHEN 5-325 MG PO TABS
1.0000 | ORAL_TABLET | Freq: Once | ORAL | Status: AC
  Administered 2017-03-24: 1 via ORAL
  Filled 2017-03-24: qty 1

## 2017-03-24 MED ORDER — MAGNESIUM SULFATE 2 GM/50ML IV SOLN
2.0000 g | Freq: Once | INTRAVENOUS | Status: AC
  Administered 2017-03-24: 2 g via INTRAVENOUS
  Filled 2017-03-24: qty 50

## 2017-03-24 MED ORDER — POTASSIUM CHLORIDE ER 10 MEQ PO TBCR: 10.0000 meq | EXTENDED_RELEASE_TABLET | Freq: Two times a day (BID) | ORAL | 0 refills | Status: DC

## 2017-03-24 MED ORDER — POTASSIUM CHLORIDE 10 MEQ/100ML IV SOLN
10.0000 meq | Freq: Once | INTRAVENOUS | Status: AC
  Administered 2017-03-24: 10 meq via INTRAVENOUS
  Filled 2017-03-24: qty 100

## 2017-03-24 MED ORDER — POTASSIUM CHLORIDE CRYS ER 20 MEQ PO TBCR
40.0000 meq | EXTENDED_RELEASE_TABLET | Freq: Once | ORAL | Status: AC
  Administered 2017-03-24: 40 meq via ORAL
  Filled 2017-03-24: qty 2

## 2017-03-24 NOTE — ED Triage Notes (Addendum)
Pt reports that his legs are too weak to support his weight. He has been getting shots in knees bilaterally (family feels this caused a decline). He now takes 10/325 hydrocodones. Has hx of degenerative disc disease and lumbar issues. He did fall twice yesterday and fell 2 weeks ago. No chest pain or shortness of breath. One of falls, he fell onto rear and states that has been causing him extreme amount of pain/numbness.

## 2017-03-24 NOTE — ED Notes (Signed)
Pt on way to MRI.  

## 2017-03-24 NOTE — ED Notes (Signed)
Pt repositioned onto left side because back was hurting.  Patient's left arm rewrapped due to skin tear.

## 2017-03-24 NOTE — ED Provider Notes (Signed)
[facility] EMERGENCY DEPARTMENT Provider Note   CSN: [id number] Arrival date & time: 03/24/17  0907     History   Chief Complaint Chief Complaint  Patient presents with  . Weakness  . Back Pain  . Leg Pain    HPI Jason Holt is a 71 y.o. male.  Patient with a history of GERD, HTN, Knee pain, and Depression presents with weakness, back pain, and leg pain. Patients states he had 4 weeks right lower extremity pain and weakness of his Bilat LE's. He states that the pain is a constant, 10/10 sharp pain in his right thigh. He states the pain is improved with the hydrocodone he is prescribed, but he has run out. He states that his primary care physician at the [facility] is aware of his pain and is managing his medications. He states that he has a follow up appointment scheduled for 11/20, but needs pain medication to make it to that appointment. He endorses falls associated with his weakness that his primary care is  aware of. He states that he has had pain in his sacrum for 1 week after landing on his bottom during a fall. Patient denies numbness. His family states they are concerned his falls a re associated with taking too much hydrocodone.        Patient and his family state that the weakness is worsening. 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 He initially stated he had been stated for this but with his family present, he clarified that he had seen his PCP who had referred him to Urology, but he had not had a chance to see urology yet.      Past Medical History:  Diagnosis Date  . Acid reflux   . Chronic knee pain   . Depression   . Hypertension     Patient Active Problem List   Diagnosis Date Noted  . GERD (gastroesophageal reflux disease) 08/15/2013    Past Surgical History:  Procedure Laterality Date  . KNEE SURGERY    . LAPAROTOMY  02/18/2011   Procedure: EXPLORATORY LAPAROTOMY;  Surgeon: Jamesetta So;  Location:  [facility];  Service: General;  Laterality: N/A;  Gastrorraphy       Home Medications    Prior to Admission medications   Medication Sig Start Date End Date Taking? Authorizing Provider  Calcium Carb-Cholecalciferol 500-200 MG-UNIT TABS Take 1 tablet by mouth 2 (two) times daily.     [provider]  docusate sodium (COLACE) 100 MG capsule Take 1 capsule (100 mg total) by mouth every 12 (twelve) hours. 08/30/16   Mesner, [name], MD  doxycycline (VIBRAMYCIN) 100 MG capsule Take 1 Capsule by mouth every 12 hours 08/27/16   [provider]  HYDROcodone-acetaminophen (NORCO) 5-325 MG tablet Take 1 tablet by mouth every 6 (six) hours as needed for severe pain. 08/30/16   Mesner, [name], MD  omeprazole (PRILOSEC) 20 MG capsule Take 20 mg by mouth daily.    [provider]    Family History History reviewed. No pertinent family history.  Social History Social History   Tobacco Use  . Smoking status: Never Smoker  . Smokeless tobacco: Never Used  Substance Use Topics  . Alcohol use: No  . Drug use: No     Allergies   Eggs or egg-derived products and Influenza virus vacc split pf   Review of Systems Review of Systems  Constitutional: Negative  for chills and fever.  HENT: Negative for ear pain and sore throat.   Eyes: Negative for pain and visual disturbance.  Respiratory: Negative for cough and shortness of breath.   Cardiovascular: Negative for chest pain and palpitations.  Gastrointestinal: Negative for abdominal pain and vomiting.  Genitourinary: Negative for dysuria and hematuria.  Musculoskeletal: Negative for arthralgias and back pain.  Skin: Negative for color change and rash.  Neurological: Negative for seizures and syncope.  All other systems reviewed and are negative.    Physical Exam Updated Vital Signs BP 103/64 (BP Location: Right Arm)   Pulse 84   Temp (!) 97.5 F (36.4 C) (Oral)   Resp 18   SpO2 97%   Physical Exam  Constitutional:  He is oriented to person, place, and time. He appears well-developed and well-nourished.  HENT:  Head: Normocephalic and atraumatic.  Eyes: Conjunctivae and EOM are normal.  Cardiovascular: Normal rate and regular rhythm.  No murmur heard. Pulmonary/Chest: Effort normal and breath sounds normal. No respiratory distress.  Abdominal: Soft. Bowel sounds are normal. He exhibits no distension. There is no tenderness.  Musculoskeletal: He exhibits no edema or deformity.  Neurological: He is alert and oriented to person, place, and time.  3-4/5 strength Bilat LE's  Skin: Skin is warm and dry.  Nursing note and vitals reviewed.    ED Treatments / Results  Labs (all labs ordered are listed, but only abnormal results are displayed) Labs Reviewed  COMPREHENSIVE METABOLIC PANEL - Abnormal; Notable for the following components:      Result Value   Potassium 2.9 (*)    Glucose, Bld 118 (*)    Calcium 8.7 (*)    Total Protein 6.2 (*)    ALT 14 (*)    All other components within normal limits  CBC WITH DIFFERENTIAL/PLATELET  MAGNESIUM    EKG  EKG Interpretation None       Radiology Dg Lumbar Spine Complete  Result Date: 03/24/2017 CLINICAL DATA:  Multiple falls over the last several weeks with lumbosacral pain. EXAM: LUMBAR SPINE - COMPLETE 4+ VIEW COMPARISON:  CT 08/30/2016 FINDINGS: Five lumbar type vertebral bodies. Very minimal thoracolumbar curvature convex to the right. No evidence of compression fracture. Minimal superior endplate osteophytes at L4 are chronic and were present on the previous CT. No significant disc space narrowing. Ordinary mild lower lumbar facet osteoarthritis. Regional arterial calcification incidentally noted. IMPRESSION: No traumatic lumbar finding. Ordinary mild lower lumbar facet osteoarthritis. Electronically Signed   By: Nelson Chimes M.D.   On: 03/24/2017 10:35   Dg Sacrum/coccyx  Result Date: 03/24/2017 CLINICAL DATA:  Multiple falls.  Lumbosacral  pain. EXAM: SACRUM AND COCCYX - 2+ VIEW COMPARISON:  CT 08/30/2016 FINDINGS: No evidence of sacrococcygeal fracture. Sacroiliac joints appear negative. IMPRESSION: No traumatic finding. Electronically Signed   By: Nelson Chimes M.D.   On: 03/24/2017 10:36    Procedures Procedures (including critical care time)  Medications Ordered in ED Medications  potassium chloride SA (K-DUR,KLOR-CON) CR tablet 40 mEq (40 mEq Oral Given 03/24/17 1145)    Followed by  potassium chloride SA (K-DUR,KLOR-CON) CR tablet 40 mEq (not administered)  potassium chloride 10 mEq in 100 mL IVPB (not administered)  magnesium sulfate IVPB 2 g 50 mL (not administered)  HYDROcodone-acetaminophen (NORCO/VICODIN) 5-325 MG per tablet 1 tablet (1 tablet Oral Given 03/24/17 1146)     Initial Impression / Assessment and Plan / ED Course  I have reviewed the triage vital signs and the nursing notes.  Pertinent labs & imaging results that were available during my care of the patient were reviewed by me and considered in my medical decision making (see chart for details).    Patient with history of pain and weakness being followed by his primary care provider at the [place]. He has run out of his pain medication and would like a prescription to carry him though to his appointment in 5 days. He denies acute changes to his pain. - DG Sacrum: Negative for acute/traumatic findings. - DG Lumbar spine: Negative for acute/traumatic findings. - CBC: WNL - CMP: K 2.9 Patient will be given pain medication and K supplementation (40mg  PO K x2 and 1 run IV K) due to hypokalemia on CMP, will provide 2g Mg as well.  Patient states his bladder mass has not been follow up on. Will obtain Lumbar MRI in the setting of worsening Lower Extremity weakness and increasing falls and incidental bladder mass on CT (08/30/16), yet to be worked up.   Final Clinical Impressions(s) / ED Diagnoses   Final diagnoses:  None    ED Discharge Orders    None         Neva Seat, MD 03/24/17 1157    Tanna Furry, MD 03/24/17 [phone]

## 2017-10-28 ENCOUNTER — Ambulatory Visit: Payer: Self-pay | Admitting: Family Medicine

## 2018-01-10 ENCOUNTER — Other Ambulatory Visit: Payer: Self-pay

## 2018-01-10 ENCOUNTER — Encounter (HOSPITAL_COMMUNITY): Payer: Self-pay

## 2018-01-10 ENCOUNTER — Emergency Department (HOSPITAL_COMMUNITY)
Admission: EM | Admit: 2018-01-10 | Discharge: 2018-01-10 | Disposition: A | Discharge status: Home / Self Care | Payer: Non-veteran care | Attending: Emergency Medicine | Admitting: Emergency Medicine

## 2018-01-10 DIAGNOSIS — I10 Essential (primary) hypertension: Secondary | ICD-10-CM | POA: Diagnosis not present

## 2018-01-10 DIAGNOSIS — Z79899 Other long term (current) drug therapy: Secondary | ICD-10-CM | POA: Insufficient documentation

## 2018-01-10 DIAGNOSIS — M5431 Sciatica, right side: Secondary | ICD-10-CM | POA: Diagnosis not present

## 2018-01-10 DIAGNOSIS — F329 Major depressive disorder, single episode, unspecified: Secondary | ICD-10-CM | POA: Diagnosis not present

## 2018-01-10 DIAGNOSIS — M545 Low back pain: Secondary | ICD-10-CM | POA: Diagnosis present

## 2018-01-10 LAB — URINALYSIS, ROUTINE W REFLEX MICROSCOPIC
Bacteria, UA: NONE SEEN
Bilirubin Urine: NEGATIVE
GLUCOSE, UA: NEGATIVE mg/dL
Ketones, ur: NEGATIVE mg/dL
Leukocytes, UA: NEGATIVE
Nitrite: NEGATIVE
Protein, ur: NEGATIVE mg/dL
SPECIFIC GRAVITY, URINE: 1.012 (ref 1.005–1.030)
pH: 6 (ref 5.0–8.0)

## 2018-01-10 MED ORDER — HYDROMORPHONE HCL 1 MG/ML IJ SOLN
0.5000 mg | Freq: Once | INTRAMUSCULAR | Status: AC
  Administered 2018-01-10: 0.5 mg via INTRAMUSCULAR
  Filled 2018-01-10: qty 1

## 2018-01-10 MED ORDER — DIAZEPAM 5 MG PO TABS
5.0000 mg | ORAL_TABLET | Freq: Once | ORAL | Status: AC
  Administered 2018-01-10: 5 mg via ORAL
  Filled 2018-01-10: qty 1

## 2018-01-10 MED ORDER — KETOROLAC TROMETHAMINE 60 MG/2ML IM SOLN: 60.0000 mg | Freq: Once | INTRAMUSCULAR | Status: DC

## 2018-01-10 MED ORDER — METHOCARBAMOL 500 MG PO TABS: 500.0000 mg | ORAL_TABLET | Freq: Two times a day (BID) | ORAL | 0 refills | Status: DC

## 2018-01-10 MED ORDER — KETOROLAC TROMETHAMINE 60 MG/2ML IM SOLN
30.0000 mg | Freq: Once | INTRAMUSCULAR | Status: AC
  Administered 2018-01-10: 30 mg via INTRAMUSCULAR
  Filled 2018-01-10: qty 2

## 2018-01-10 MED ORDER — METHYLPREDNISOLONE 4 MG PO TBPK: ORAL_TABLET | ORAL | 0 refills | Status: DC

## 2018-01-10 NOTE — Discharge Instructions (Addendum)
Take the steroids and muscle relaxers as prescribed.  Follow-up with your doctor.  Return to the ED with new weakness, numbness, worsening pain, incontinence, fever or any other concerns.

## 2018-01-10 NOTE — ED Provider Notes (Signed)
[facility] EMERGENCY DEPARTMENT Provider Note   CSN: [id number] Arrival date & time: 01/10/18  0402     History   Chief Complaint Chief Complaint  Patient presents with  . Back Pain    radiating down right leg/groin    HPI Jason Holt is a 72 y.o. male.  Patient presents with severe right-sided low back pain that radiates down his right leg and groin.  This is been ongoing for the past 1 week.  Denies any falls or injury.  He was seen in urgent care in August 27 and treated with Toradol as well as a prednisone taper and Vicodin.  He finished his Vicodin prescription yesterday.  He reports worsening pain over the past several days that radiates down his right leg.  This is associated with pain and subjective weakness.  Denies any numbness or tingling.  No bowel or bladder incontinence.  No fever or vomiting.  No difficulty with urination or bowel movements.  No chest pain or shortness of breath.  Reports no previous back surgeries.  He is scheduled to see his doctor at the [place] in several days.  The history is provided by the patient and a relative.    Past Medical History:  Diagnosis Date  . Acid reflux   . Chronic knee pain   . Depression   . Hypertension     Patient Active Problem List   Diagnosis Date Noted  . GERD (gastroesophageal reflux disease) 08/15/2013    Past Surgical History:  Procedure Laterality Date  . KNEE SURGERY    . LAPAROTOMY  02/18/2011   Procedure: EXPLORATORY LAPAROTOMY;  Surgeon: Jamesetta So;  Location: [facility];  Service: General;  Laterality: N/A;  Gastrorraphy        Home Medications    Prior to Admission medications   Medication Sig Start Date End Date Taking? Authorizing Provider  vitamin B-12 (CYANOCOBALAMIN) 1000 MCG tablet Take 1,000 mcg by mouth daily.   Yes [provider]  docusate sodium (COLACE) 100 MG capsule Take 1 capsule (100 mg total) by mouth every 12 (twelve) hours. 08/30/16   Mesner, [name], MD    HYDROcodone-acetaminophen (NORCO/VICODIN) 5-325 MG tablet Take 1 tablet every 4 (four) hours as needed by mouth. 03/24/17   Tanna Furry, MD  lisinopril (PRINIVIL,ZESTRIL) 5 MG tablet Take 5 mg daily by mouth.    [provider]  omeprazole (PRILOSEC) 20 MG capsule Take 20 mg by mouth daily.    [provider]  potassium chloride (K-DUR) 10 MEQ tablet Take 1 tablet (10 mEq total) 2 (two) times daily by mouth. 03/24/17   Tanna Furry, MD  Vitamin D, Cholecalciferol, 1000 units CAPS Take 1,000 capsules 2 (two) times daily by mouth.    [provider]    Family History No family history on file.  Social History Social History   Tobacco Use  . Smoking status: Never Smoker  . Smokeless tobacco: Never Used  Substance Use Topics  . Alcohol use: No  . Drug use: No     Allergies   Eggs or egg-derived products; Influenza virus vacc split pf; and Dye fdc red [red dye]   Review of Systems Review of Systems  Constitutional: Negative for activity change, appetite change and fever.  HENT: Negative for congestion and rhinorrhea.   Eyes: Negative for visual disturbance.  Respiratory: Negative for cough, chest tightness and shortness of breath.   Cardiovascular: Negative for chest pain and leg swelling.  Gastrointestinal: Negative for abdominal  pain, blood in stool, nausea and vomiting.  Genitourinary: Negative for dysuria, hematuria, testicular pain and urgency.  Musculoskeletal: Positive for arthralgias, back pain and myalgias.  Neurological: Negative for weakness, light-headedness and numbness.   all other systems are negative except as noted in the HPI and PMH.     Physical Exam Updated Vital Signs BP (!) 178/83 (BP Location: Right Arm)   Pulse 60   Temp 98.1 F (36.7 C) (Oral)   Resp 20   Ht 5\' 5"  (1.651 m)   Wt 86.2 kg   SpO2 99%   BMI 31.62 kg/m   Physical Exam  Constitutional: He is oriented to person, place, and time. He appears well-developed  and well-nourished. No distress.  uncomfortable  HENT:  Head: Normocephalic and atraumatic.  Mouth/Throat: Oropharynx is clear and moist. No oropharyngeal exudate.  Eyes: Pupils are equal, round, and reactive to light. Conjunctivae and EOM are normal.  Neck: Normal range of motion. Neck supple.  No meningismus.  Cardiovascular: Normal rate, regular rhythm, normal heart sounds and intact distal pulses.  No murmur heard. Pulmonary/Chest: Effort normal and breath sounds normal. No respiratory distress.  Abdominal: Soft. There is no tenderness. There is no rebound and no guarding.  Multiple previous surgical scars, well-healed  Genitourinary:  Genitourinary Comments: No testicular pain  Musculoskeletal: Normal range of motion. He exhibits tenderness. He exhibits no edema.  Right SI joint tenderness, no midline tenderness  5/5 strength in bilateral lower extremities. Ankle plantar and dorsiflexion intact. Great toe extension intact bilaterally. +2 DP and PT pulses. +2 patellar reflexes bilaterally. Normal gait.   Neurological: He is alert and oriented to person, place, and time. No cranial nerve deficit. He exhibits normal muscle tone. Coordination normal.  No ataxia on finger to nose bilaterally. No pronator drift. 5/5 strength throughout. CN 2-12 intact.Equal grip strength. Sensation intact.   Skin: Skin is warm.  Psychiatric: He has a normal mood and affect. His behavior is normal.  Nursing note and vitals reviewed.    ED Treatments / Results  Labs (all labs ordered are listed, but only abnormal results are displayed) Labs Reviewed  URINALYSIS, ROUTINE W REFLEX MICROSCOPIC - Abnormal; Notable for the following components:      Result Value   Color, Urine STRAW (*)    Hgb urine dipstick MODERATE (*)    All other components within normal limits    EKG None  Radiology No results found.  Procedures Procedures (including critical care time)  Medications Ordered in  ED Medications - No data to display   Initial Impression / Assessment and Plan / ED Course  I have reviewed the triage vital signs and the nursing notes.  Pertinent labs & imaging results that were available during my care of the patient were reviewed by me and considered in my medical decision making (see chart for details).    Exam consistent with sciatica.  Neurovascularly intact.  Low suspicion for cord compression or cauda equina.  Intact distal pulses and strength and sensation.  Patient's pain is improved with pain medication and muscle relaxers in the ED.  Is able to ambulate.  Low suspicion for cord compression or cauda equina.  We will treat supportively for sciatica.  He has follow-up with his [facility] doctor in 2 days.  Return precautions discussed including weakness, numbness, tingling, bowel or bladder incontinence, fever vomiting or any other concerns.  Final Clinical Impressions(s) / ED Diagnoses   Final diagnoses:  Sciatica, right side    ED  Discharge Orders    None       Ezequiel Essex, MD 01/10/18 0800

## 2018-01-10 NOTE — ED Triage Notes (Signed)
Pt reports lower back pain (right side) radiating down right leg. Seen at urgent care on 01/03/18 and diagnosed with sciatica and given a Toradol shot IM in office- pt reports it helped him a lot and helped for about 2 days.  Pt was also prescribed prednisone taper and Hydroco/APA (24 tablets) that he has finished taking. Reports pain is back and he "can hardly walk" Pt has appt at the [place] on Thursday.

## 2018-01-16 ENCOUNTER — Other Ambulatory Visit: Payer: Self-pay

## 2018-01-16 ENCOUNTER — Emergency Department (HOSPITAL_COMMUNITY)
Admission: EM | Admit: 2018-01-16 | Discharge: 2018-01-16 | Disposition: A | Discharge status: Home / Self Care | Payer: Non-veteran care | Attending: Emergency Medicine | Admitting: Emergency Medicine

## 2018-01-16 ENCOUNTER — Encounter (HOSPITAL_COMMUNITY): Payer: Self-pay

## 2018-01-16 DIAGNOSIS — M5431 Sciatica, right side: Secondary | ICD-10-CM | POA: Insufficient documentation

## 2018-01-16 DIAGNOSIS — M5489 Other dorsalgia: Secondary | ICD-10-CM | POA: Diagnosis present

## 2018-01-16 DIAGNOSIS — I10 Essential (primary) hypertension: Secondary | ICD-10-CM | POA: Insufficient documentation

## 2018-01-16 MED ORDER — METHYLPREDNISOLONE SODIUM SUCC 125 MG IJ SOLR
125.0000 mg | Freq: Once | INTRAMUSCULAR | Status: AC
  Administered 2018-01-16: 125 mg via INTRAMUSCULAR
  Filled 2018-01-16: qty 2

## 2018-01-16 MED ORDER — PREDNISONE 20 MG PO TABS: ORAL_TABLET | ORAL | 0 refills | Status: DC

## 2018-01-16 MED ORDER — HYDROMORPHONE HCL 1 MG/ML IJ SOLN
1.0000 mg | Freq: Once | INTRAMUSCULAR | Status: AC
  Administered 2018-01-16: 1 mg via INTRAMUSCULAR
  Filled 2018-01-16: qty 1

## 2018-01-16 MED ORDER — OXYCODONE-ACETAMINOPHEN 5-325 MG PO TABS: 1.0000 | ORAL_TABLET | ORAL | 0 refills | Status: DC | PRN

## 2018-01-16 NOTE — ED Triage Notes (Signed)
Pt is having right sided lower back pain that radiates down into right leg. Was seen here a week ago for the same, but no relief. Was discharged with muscle relaxers. Has MRI set up for th 27th.

## 2018-01-16 NOTE — ED Provider Notes (Signed)
Emergency Department Provider Note   I have reviewed the triage vital signs and the nursing notes.   HISTORY  Chief Complaint Back Pain   HPI Jason Holt is a 72 y.o. male multiple medical problems documented below the presents the emergency department today with persistent sciatica pain.  Patient states he was seen here approximately week ago and diagnosed with the same.  [facility] but that appointment is not for a couple more weeks.  His pain is dissipated with the steroids of pain medication however his back hurts now that he is out of both.  He has an MRI scheduled for the end of the month, which he plans to get rescheduled for the end of this week.  His symptoms are any worse than when he was here a week ago and he has no new symptoms such as weakness, numbness or paresthesias.  He has has right-sided back pain that radiates around his right thigh to the his right anterior thigh. No other associated or modifying symptoms.    Past Medical History:  Diagnosis Date  . Acid reflux   . Chronic knee pain   . Depression   . Hypertension     Patient Active Problem List   Diagnosis Date Noted  . GERD (gastroesophageal reflux disease) 08/15/2013    Past Surgical History:  Procedure Laterality Date  . KNEE SURGERY    . LAPAROTOMY  02/18/2011   Procedure: EXPLORATORY LAPAROTOMY;  Surgeon: Jamesetta So;  Location: [facility];  Service: General;  Laterality: N/A;  Gastrorraphy    Current Outpatient Rx  . Order #: [id number] Class: Print  . Order #: [id number] Class: Print  . Order #: [id number] Class: Historical Med  . Order #: [id number] Class: Historical Med  . Order #: [id number] Class: Historical Med  . Order #: [id number] Class: Historical Med  . Order #: [id number] Class: Print  . Order #: [id number] Class: Print    Allergies Eggs or egg-derived products; Influenza virus vacc split pf; and Dye fdc red [red dye]  No  family history on file.  Social History Social History   Tobacco Use  . Smoking status: Never Smoker  . Smokeless tobacco: Never Used  Substance Use Topics  . Alcohol use: No  . Drug use: No    Review of Systems  All other systems negative except as documented in the HPI. All pertinent positives and negatives as reviewed in the HPI. ____________________________________________   PHYSICAL EXAM:  VITAL SIGNS: ED Triage Vitals  Enc Vitals Group     BP 01/16/18 0852 (!) 178/90     Pulse Rate 01/16/18 0852 84     Resp 01/16/18 0852 18     Temp 01/16/18 0852 97.8 F (36.6 C)     Temp Source 01/16/18 0852 Oral     SpO2 01/16/18 0852 100 %     Weight 01/16/18 0854 190 lb (86.2 kg)     Height 01/16/18 0854 5\' 5"  (1.651 m)    Constitutional: Alert and oriented. Well appearing and in no acute distress. Eyes: Conjunctivae are normal. PERRL. EOMI. Head: Atraumatic. Nose: No congestion/rhinnorhea. Mouth/Throat: Mucous membranes are moist.  Oropharynx non-erythematous. Neck: No stridor.  No meningeal signs.   Cardiovascular: Normal rate, regular rhythm. Good peripheral circulation. Grossly normal heart sounds.   Respiratory: Normal respiratory effort.  No retractions. Lungs CTAB. Gastrointestinal: Soft and nontender. No distention.  Musculoskeletal: No lower extremity tenderness nor edema. No gross deformities of extremities. Neurologic:  Normal speech and language. No gross focal neurologic deficits are appreciated.  Patient able to ambulate without difficulty.  Normal sensation up and down bilateral legs to light touch.  Symmetric dorsiflexion plantarflexion of his bilateral feet.  Symmetric abduction, abduction, flexion and extension at the hip. No perineal anesthesia.  Skin:  Skin is warm, dry and intact. No rash noted.  ____________________________________________   INITIAL IMPRESSION / ASSESSMENT AND PLAN / ED COURSE  Agree with previous diagnosis of acute sciatica.  I  think the steroids probably help more than anything but not he is out of those he needs more.  He Artie has an MRI scheduled and prefers to not do one now just wait for his outpatient MRI.  There is no hard indications for emergent MRI as I think there is low likelihood of cauda equina, transverse myelitis, discitis, osteomyelitis or other emergent causes for his back and sciatic pain at this time.  Will discharge on pain control and [facility] follow-up   Pertinent labs & imaging results that were available during my care of the patient were reviewed by me and considered in my medical decision making (see chart for details).  ____________________________________________  FINAL CLINICAL IMPRESSION(S) / ED DIAGNOSES  Final diagnoses:  Sciatica of right side    MEDICATIONS GIVEN DURING THIS VISIT:  Medications  HYDROmorphone (DILAUDID) injection 1 mg (1 mg Intramuscular Given 01/16/18 1109)  methylPREDNISolone sodium succinate (SOLU-MEDROL) 125 mg/2 mL injection 125 mg (125 mg Intramuscular Given 01/16/18 1109)     NEW OUTPATIENT MEDICATIONS STARTED DURING THIS VISIT:  New Prescriptions   OXYCODONE-ACETAMINOPHEN (PERCOCET) 5-325 MG TABLET    Take 1 tablet by mouth every 4 (four) hours as needed.   PREDNISONE (DELTASONE) 20 MG TABLET    3 tabs po daily x 3 days, then 2 tabs x 3 days, then 1.5 tabs x 3 days, then 1 tab x 3 days, then 0.5 tabs x 3 days    Note:  This note was prepared with assistance of Dragon voice recognition software. Occasional wrong-word or sound-a-like substitutions may have occurred due to the inherent limitations of voice recognition software.   Merrily Pew, MD 01/16/18 1133

## 2018-01-16 NOTE — ED Notes (Signed)
Pt ambulating to rest room.

## 2018-10-03 ENCOUNTER — Encounter (HOSPITAL_COMMUNITY): Payer: Self-pay | Admitting: *Deleted

## 2018-10-03 ENCOUNTER — Emergency Department (HOSPITAL_COMMUNITY): Payer: Non-veteran care

## 2018-10-03 ENCOUNTER — Other Ambulatory Visit: Payer: Self-pay

## 2018-10-03 ENCOUNTER — Emergency Department (HOSPITAL_COMMUNITY)
Admission: EM | Admit: 2018-10-03 | Discharge: 2018-10-03 | Disposition: A | Discharge status: Home / Self Care | Payer: Non-veteran care | Attending: Emergency Medicine | Admitting: Emergency Medicine

## 2018-10-03 DIAGNOSIS — R2231 Localized swelling, mass and lump, right upper limb: Secondary | ICD-10-CM | POA: Diagnosis present

## 2018-10-03 DIAGNOSIS — I10 Essential (primary) hypertension: Secondary | ICD-10-CM | POA: Insufficient documentation

## 2018-10-03 DIAGNOSIS — L03113 Cellulitis of right upper limb: Secondary | ICD-10-CM | POA: Diagnosis not present

## 2018-10-03 DIAGNOSIS — Z79899 Other long term (current) drug therapy: Secondary | ICD-10-CM | POA: Insufficient documentation

## 2018-10-03 HISTORY — DX: Other intervertebral disc displacement, lumbar region: M51.26

## 2018-10-03 LAB — COMPREHENSIVE METABOLIC PANEL
ALT: 24 U/L (ref 0–44)
AST: 19 U/L (ref 15–41)
Albumin: 3.2 g/dL — ABNORMAL LOW (ref 3.5–5.0)
Alkaline Phosphatase: 101 U/L (ref 38–126)
Anion gap: 15 (ref 5–15)
BUN: 16 mg/dL (ref 8–23)
CO2: 22 mmol/L (ref 22–32)
Calcium: 8.6 mg/dL — ABNORMAL LOW (ref 8.9–10.3)
Chloride: 100 mmol/L (ref 98–111)
Creatinine, Ser: 0.63 mg/dL (ref 0.61–1.24)
GFR calc Af Amer: >60 mL/min (ref >60)
GFR calc non Af Amer: >60 mL/min (ref >60)
Glucose, Bld: 96 mg/dL (ref 70–99)
Potassium: 3.5 mmol/L (ref 3.5–5.1)
Sodium: 137 mmol/L (ref 135–145)
Total Bilirubin: 0.9 mg/dL (ref 0.3–1.2)
Total Protein: 6.9 g/dL (ref 6.5–8.1)

## 2018-10-03 LAB — CBC WITH DIFFERENTIAL/PLATELET
Abs Immature Granulocytes: 0.08 10*3/uL — ABNORMAL HIGH (ref 0.00–0.07)
Basophils Absolute: 0.1 10*3/uL (ref 0.0–0.1)
Basophils Relative: 1 %
Eosinophils Absolute: 0.1 10*3/uL (ref 0.0–0.5)
Eosinophils Relative: 2 %
HCT: 39.2 % (ref 39.0–52.0)
Hemoglobin: 12.7 g/dL — ABNORMAL LOW (ref 13.0–17.0)
Immature Granulocytes: 1 %
Lymphocytes Relative: 13 %
Lymphs Abs: 1.1 10*3/uL (ref 0.7–4.0)
MCH: 28.7 pg (ref 26.0–34.0)
MCHC: 32.4 g/dL (ref 30.0–36.0)
MCV: 88.5 fL (ref 80.0–100.0)
Monocytes Absolute: 0.8 10*3/uL (ref 0.1–1.0)
Monocytes Relative: 10 %
Neutro Abs: 6.3 10*3/uL (ref 1.7–7.7)
Neutrophils Relative %: 73 %
Platelets: 359 10*3/uL (ref 150–400)
RBC: 4.43 MIL/uL (ref 4.22–5.81)
RDW: 14.8 % (ref 11.5–15.5)
WBC: 8.5 10*3/uL (ref 4.0–10.5)
nRBC: 0 % (ref 0.0–0.2)

## 2018-10-03 LAB — ETHANOL: Alcohol, Ethyl (B): <10 mg/dL (ref <10)

## 2018-10-03 LAB — LIPASE, BLOOD: Lipase: 24 U/L (ref 11–51)

## 2018-10-03 MED ORDER — SODIUM CHLORIDE 0.9 % IV SOLN
INTRAVENOUS | Status: DC
  Administered 2018-10-03: 13:00:00 via INTRAVENOUS

## 2018-10-03 MED ORDER — DOXYCYCLINE HYCLATE 100 MG PO CAPS: 100.0000 mg | ORAL_CAPSULE | Freq: Two times a day (BID) | ORAL | 0 refills | Status: DC

## 2018-10-03 MED ORDER — CEFAZOLIN SODIUM-DEXTROSE 1-4 GM/50ML-% IV SOLN
1.0000 g | Freq: Once | INTRAVENOUS | Status: AC
  Administered 2018-10-03: 1 g via INTRAVENOUS
  Filled 2018-10-03: qty 50

## 2018-10-03 MED ORDER — TRAMADOL HCL 50 MG PO TABS: 50.0000 mg | ORAL_TABLET | Freq: Four times a day (QID) | ORAL | 0 refills | Status: DC | PRN

## 2018-10-03 NOTE — Discharge Instructions (Addendum)
Take tramadol as needed for pain.  Very important to take the doxycycline antibiotic for the next 7 days.  Would expect improvement over the next 2 days.  Make an appointment to follow-up with the [facility] clinic in [place].  Return for any new or worse symptoms or if the redness in the arm does not improve over the next few days.

## 2018-10-03 NOTE — ED Triage Notes (Signed)
Pt reports he tripped over something and fell 3 days ago onto his right arm and then the next day it started swelling and turning red. Pt's right lower arm is edematous, skin is red and warm. Denies LOC upon fall. Denies use of blood thinners.

## 2018-10-03 NOTE — ED Notes (Signed)
Instructed pt to take all of antibiotics as prescribed. 

## 2018-10-31 NOTE — ED Provider Notes (Signed)
[facility] EMERGENCY DEPARTMENT Provider Note   CSN: [id number] Arrival date & time: 10/03/18  1059    History   Chief Complaint Chief Complaint  Patient presents with  . Arm Swelling    HPI Jason Holt is a 73 y.o. male.     Patient brought in by family member.  Patient tripped over and fell about 3 days ago onto his right arm and then the next day it started swelling and turning red.  Patient has edema to the distal aspect of the arm.  Skin is red and warm.  Patient denies any loss of consciousness upon the fall.  Patient denies any blood thinners.  Patient denies any other injuries.  Patient denies fever or upper respiratory symptoms.     Past Medical History:  Diagnosis Date  . Acid reflux   . Chronic knee pain   . Depression   . Hypertension   . Ruptured lumbar disc     Patient Active Problem List   Diagnosis Date Noted  . GERD (gastroesophageal reflux disease) 08/15/2013    Past Surgical History:  Procedure Laterality Date  . KNEE SURGERY    . LAPAROTOMY  02/18/2011   Procedure: EXPLORATORY LAPAROTOMY;  Surgeon: Jamesetta So;  Location: [facility];  Service: General;  Laterality: N/A;  Gastrorraphy        Home Medications    Prior to Admission medications   Medication Sig Start Date End Date Taking? Authorizing Provider  docusate sodium (COLACE) 100 MG capsule Take 1 capsule (100 mg total) by mouth every 12 (twelve) hours. 08/30/16   Mesner, [name], MD  doxycycline (VIBRAMYCIN) 100 MG capsule Take 1 capsule (100 mg total) by mouth 2 (two) times daily. 10/03/18   Fredia Sorrow, MD  HYDROcodone-acetaminophen (NORCO/VICODIN) 5-325 MG tablet Take 1 tablet every 4 (four) hours as needed by mouth. 03/24/17   Tanna Furry, MD  lisinopril (PRINIVIL,ZESTRIL) 5 MG tablet Take 5 mg daily by mouth.    [provider]  omeprazole (PRILOSEC) 20 MG capsule Take 20 mg by mouth daily.    [provider]  oxyCODONE-acetaminophen (PERCOCET) 5-325 MG  tablet Take 1 tablet by mouth every 4 (four) hours as needed. 01/16/18   Mesner, [name], MD  predniSONE (DELTASONE) 20 MG tablet 3 tabs po daily x 3 days, then 2 tabs x 3 days, then 1.5 tabs x 3 days, then 1 tab x 3 days, then 0.5 tabs x 3 days 01/16/18   Mesner, [name], MD  traMADol (ULTRAM) 50 MG tablet Take 1 tablet (50 mg total) by mouth every 6 (six) hours as needed. 10/03/18   Fredia Sorrow, MD  VITAMIN B1-B12 IJ Inject 1,000 mg as directed every 30 (thirty) days.    [provider]  Vitamin D, Cholecalciferol, 1000 units CAPS Take 1,000 capsules by mouth daily.     [provider]    Family History No family history on file.  Social History Social History   Tobacco Use  . Smoking status: Never Smoker  . Smokeless tobacco: Never Used  Substance Use Topics  . Alcohol use: No  . Drug use: No     Allergies   Eggs or egg-derived products, Influenza virus vacc split pf, and Dye fdc red [red dye]   Review of Systems Review of Systems  Constitutional: Negative for chills and fever.  HENT: Negative for congestion, rhinorrhea and sore throat.   Eyes: Negative for visual disturbance.  Respiratory: Negative for cough and shortness of breath.  Cardiovascular: Negative for chest pain and leg swelling.  Gastrointestinal: Negative for abdominal pain, diarrhea, nausea and vomiting.  Genitourinary: Negative for dysuria.  Musculoskeletal: Negative for back pain and neck pain.  Skin: Negative for rash.  Neurological: Negative for dizziness, light-headedness and headaches.  Hematological: Does not bruise/bleed easily.  Psychiatric/Behavioral: Negative for confusion.     Physical Exam Updated Vital Signs BP 112/72 (BP Location: Left Arm)   Pulse 86   Temp 97.8 F (36.6 C) (Oral)   Resp 20   Ht 1.626 m (5\' 4" )   Wt 81.6 kg   SpO2 97%   BMI 30.90 kg/m   Physical Exam Vitals signs and nursing note reviewed.  Constitutional:      Appearance: He is  well-developed.  HENT:     Head: Normocephalic and atraumatic.  Eyes:     Conjunctiva/sclera: Conjunctivae normal.  Neck:     Musculoskeletal: Neck supple.  Cardiovascular:     Rate and Rhythm: Normal rate and regular rhythm.     Heart sounds: No murmur.  Pulmonary:     Effort: Pulmonary effort is normal. No respiratory distress.     Breath sounds: Normal breath sounds.  Abdominal:     Palpations: Abdomen is soft.     Tenderness: There is no abdominal tenderness.  Musculoskeletal:        General: Swelling present.     Comments: Patient with erythema and swelling to the distal right upper extremity.  Some skin abrasion.  Increased warmth.  Radial pulse 2+.  Good movement of the fingers.  Cap refill less than 2 seconds.  Sensation intact.  Skin:    General: Skin is warm and dry.     Capillary Refill: Capillary refill takes less than 2 seconds.  Neurological:     General: No focal deficit present.     Mental Status: He is alert and oriented to person, place, and time.      ED Treatments / Results  Labs (all labs ordered are listed, but only abnormal results are displayed) Labs Reviewed  CBC WITH DIFFERENTIAL/PLATELET - Abnormal; Notable for the following components:      Result Value   Hemoglobin 12.7 (*)    Abs Immature Granulocytes 0.08 (*)    All other components within normal limits  COMPREHENSIVE METABOLIC PANEL - Abnormal; Notable for the following components:   Calcium 8.6 (*)    Albumin 3.2 (*)    All other components within normal limits  LIPASE, BLOOD  ETHANOL    EKG None  Radiology No results found.   Results for orders placed or performed during the hospital encounter of 10/03/18  CBC with Differential/Platelet  Result Value Ref Range   WBC 8.5 4.0 - 10.5 K/uL   RBC 4.43 4.22 - 5.81 MIL/uL   Hemoglobin 12.7 (L) 13.0 - 17.0 g/dL   HCT 39.2 39.0 - 52.0 %   MCV 88.5 80.0 - 100.0 fL   MCH 28.7 26.0 - 34.0 pg   MCHC 32.4 30.0 - 36.0 g/dL   RDW 14.8  11.5 - 15.5 %   Platelets 359 150 - 400 K/uL   nRBC 0.0 0.0 - 0.2 %   Neutrophils Relative % 73 %   Neutro Abs 6.3 1.7 - 7.7 K/uL   Lymphocytes Relative 13 %   Lymphs Abs 1.1 0.7 - 4.0 K/uL   Monocytes Relative 10 %   Monocytes Absolute 0.8 0.1 - 1.0 K/uL   Eosinophils Relative 2 %   Eosinophils Absolute  0.1 0.0 - 0.5 K/uL   Basophils Relative 1 %   Basophils Absolute 0.1 0.0 - 0.1 K/uL   Immature Granulocytes 1 %   Abs Immature Granulocytes 0.08 (H) 0.00 - 0.07 K/uL  Comprehensive metabolic panel  Result Value Ref Range   Sodium 137 135 - 145 mmol/L   Potassium 3.5 3.5 - 5.1 mmol/L   Chloride 100 98 - 111 mmol/L   CO2 22 22 - 32 mmol/L   Glucose, Bld 96 70 - 99 mg/dL   BUN 16 8 - 23 mg/dL   Creatinine, Ser 0.63 0.61 - 1.24 mg/dL   Calcium 8.6 (L) 8.9 - 10.3 mg/dL   Total Protein 6.9 6.5 - 8.1 g/dL   Albumin 3.2 (L) 3.5 - 5.0 g/dL   AST 19 15 - 41 U/L   ALT 24 0 - 44 U/L   Alkaline Phosphatase 101 38 - 126 U/L   Total Bilirubin 0.9 0.3 - 1.2 mg/dL   GFR calc non Af Amer >60 >60 mL/min   GFR calc Af Amer >60 >60 mL/min   Anion gap 15 5 - 15  Lipase, blood  Result Value Ref Range   Lipase 24 11 - 51 U/L  Ethanol  Result Value Ref Range   Alcohol, Ethyl (B) <10 <10 mg/dL   Dg Forearm Right  Result Date: 10/03/2018 CLINICAL DATA:  Right forearm pain and swelling after fall 4 days ago. EXAM: RIGHT FOREARM - 2 VIEW COMPARISON:  None. FINDINGS: No acute fracture or dislocation. No focal bone lesion. No elbow joint effusion. Diffuse soft tissue swelling. IMPRESSION: 1. Diffuse soft tissue swelling.  No acute osseous abnormality. Electronically Signed   By: Titus Dubin M.D.   On: 10/03/2018 13:51     Procedures Procedures (including critical care time)  Medications Ordered in ED Medications  ceFAZolin (ANCEF) IVPB 1 g/50 mL premix (0 g Intravenous Stopped 10/03/18 1504)     Initial Impression / Assessment and Plan / ED Course  I have reviewed the triage vital signs  and the nursing notes.  Pertinent labs & imaging results that were available during my care of the patient were reviewed by me and considered in my medical decision making (see chart for details).      Clinical findings consistent with a significant cellulitis to the right upper extremity mostly forearm area.  X-ray showed no bony abnormality.  Patient will be treated as a cellulitis.  Patient will be treated with doxycycline.  Labs without any significant abnormalities related to the cellulitis.    Final Clinical Impressions(s) / ED Diagnoses   Final diagnoses:  Cellulitis of right upper extremity    ED Discharge Orders         Ordered    doxycycline (VIBRAMYCIN) 100 MG capsule  2 times daily     10/03/18 1439    traMADol (ULTRAM) 50 MG tablet  Every 6 hours PRN     10/03/18 1439           Fredia Sorrow, MD 10/31/18 [phone]

## 2019-05-08 ENCOUNTER — Encounter (HOSPITAL_COMMUNITY): Payer: Self-pay | Admitting: Emergency Medicine

## 2019-05-08 ENCOUNTER — Emergency Department (HOSPITAL_COMMUNITY)
Admission: EM | Admit: 2019-05-08 | Discharge: 2019-05-09 | Disposition: A | Discharge status: Home / Self Care | Payer: No Typology Code available for payment source | Attending: Emergency Medicine | Admitting: Emergency Medicine

## 2019-05-08 ENCOUNTER — Other Ambulatory Visit: Payer: Self-pay

## 2019-05-08 ENCOUNTER — Emergency Department (HOSPITAL_COMMUNITY): Payer: No Typology Code available for payment source

## 2019-05-08 DIAGNOSIS — Z79899 Other long term (current) drug therapy: Secondary | ICD-10-CM | POA: Insufficient documentation

## 2019-05-08 DIAGNOSIS — I10 Essential (primary) hypertension: Secondary | ICD-10-CM | POA: Insufficient documentation

## 2019-05-08 DIAGNOSIS — Y92009 Unspecified place in unspecified non-institutional (private) residence as the place of occurrence of the external cause: Secondary | ICD-10-CM

## 2019-05-08 DIAGNOSIS — Y999 Unspecified external cause status: Secondary | ICD-10-CM | POA: Insufficient documentation

## 2019-05-08 DIAGNOSIS — S8991XA Unspecified injury of right lower leg, initial encounter: Secondary | ICD-10-CM | POA: Diagnosis present

## 2019-05-08 DIAGNOSIS — S81811A Laceration without foreign body, right lower leg, initial encounter: Secondary | ICD-10-CM

## 2019-05-08 DIAGNOSIS — G8929 Other chronic pain: Secondary | ICD-10-CM | POA: Diagnosis not present

## 2019-05-08 DIAGNOSIS — Y92019 Unspecified place in single-family (private) house as the place of occurrence of the external cause: Secondary | ICD-10-CM | POA: Insufficient documentation

## 2019-05-08 DIAGNOSIS — Y939 Activity, unspecified: Secondary | ICD-10-CM | POA: Diagnosis not present

## 2019-05-08 DIAGNOSIS — M545 Low back pain, unspecified: Secondary | ICD-10-CM

## 2019-05-08 DIAGNOSIS — W010XXA Fall on same level from slipping, tripping and stumbling without subsequent striking against object, initial encounter: Secondary | ICD-10-CM | POA: Diagnosis not present

## 2019-05-08 DIAGNOSIS — W19XXXA Unspecified fall, initial encounter: Secondary | ICD-10-CM

## 2019-05-08 MED ORDER — OXYCODONE-ACETAMINOPHEN 5-325 MG PO TABS
1.0000 | ORAL_TABLET | Freq: Once | ORAL | Status: AC
  Administered 2019-05-08: 1 via ORAL
  Filled 2019-05-08: qty 1

## 2019-05-08 MED ORDER — LIDOCAINE HCL (PF) 1 % IJ SOLN
20.0000 mL | Freq: Once | INTRAMUSCULAR | Status: DC
  Filled 2019-05-08: qty 30

## 2019-05-08 NOTE — ED Provider Notes (Signed)
[facility] EMERGENCY DEPARTMENT Provider Note   CSN: [id number] Arrival date & time: 05/08/19  2136   History Chief Complaint  Patient presents with  . Laceration    Jason Holt is a 73 y.o. male.  The history is provided by the patient.  Laceration He has history of hypertension and lumbar disc disease and comes in following a fall at home.  He states that he had an injection in his back recently and he has been having difficulty with weakness since then.  He suffered a laceration to his right lower leg and is also complaining of increased pain in his lower back.  He denies head injury or loss of consciousness.  He is not on any anticoagulants.  Last tetanus immunization was 3 years ago.  Past Medical History:  Diagnosis Date  . Acid reflux   . Chronic knee pain   . Depression   . Hypertension   . Ruptured lumbar disc     Patient Active Problem List   Diagnosis Date Noted  . GERD (gastroesophageal reflux disease) 08/15/2013    Past Surgical History:  Procedure Laterality Date  . KNEE SURGERY    . LAPAROTOMY  02/18/2011   Procedure: EXPLORATORY LAPAROTOMY;  Surgeon: Jamesetta So;  Location: [facility];  Service: General;  Laterality: N/A;  Gastrorraphy       No family history on file.  Social History   Tobacco Use  . Smoking status: Never Smoker  . Smokeless tobacco: Never Used  Substance Use Topics  . Alcohol use: No  . Drug use: No    Home Medications Prior to Admission medications   Medication Sig Start Date End Date Taking? Authorizing Provider  docusate sodium (COLACE) 100 MG capsule Take 1 capsule (100 mg total) by mouth every 12 (twelve) hours. 08/30/16   Mesner, [name], MD  doxycycline (VIBRAMYCIN) 100 MG capsule Take 1 capsule (100 mg total) by mouth 2 (two) times daily. 10/03/18   Fredia Sorrow, MD  HYDROcodone-acetaminophen (NORCO/VICODIN) 5-325 MG tablet Take 1 tablet every 4 (four) hours as needed by mouth. 03/24/17   Tanna Furry, MD   lisinopril (PRINIVIL,ZESTRIL) 5 MG tablet Take 5 mg daily by mouth.    [provider]  omeprazole (PRILOSEC) 20 MG capsule Take 20 mg by mouth daily.    [provider]  oxyCODONE-acetaminophen (PERCOCET) 5-325 MG tablet Take 1 tablet by mouth every 4 (four) hours as needed. 01/16/18   Mesner, [name], MD  predniSONE (DELTASONE) 20 MG tablet 3 tabs po daily x 3 days, then 2 tabs x 3 days, then 1.5 tabs x 3 days, then 1 tab x 3 days, then 0.5 tabs x 3 days 01/16/18   Mesner, [name], MD  traMADol (ULTRAM) 50 MG tablet Take 1 tablet (50 mg total) by mouth every 6 (six) hours as needed. 10/03/18   Fredia Sorrow, MD  VITAMIN B1-B12 IJ Inject 1,000 mg as directed every 30 (thirty) days.    [provider]  Vitamin D, Cholecalciferol, 1000 units CAPS Take 1,000 capsules by mouth daily.     [provider]    Allergies    Eggs or egg-derived products, Influenza virus vacc split pf, and Dye fdc red [red dye]  Review of Systems   Review of Systems  All other systems reviewed and are negative.   Physical Exam Updated Vital Signs BP 128/70   Pulse 62   Temp 98 F (36.7 C) (Oral)   Resp 16   Ht 5'  5" (1.651 m)   Wt 81.6 kg   SpO2 100%   BMI 29.95 kg/m   Physical Exam Vitals and nursing note reviewed.   73 year old male, resting comfortably and in no acute distress. Vital signs are normal. Oxygen saturation is 100%, which is normal. Head is normocephalic and atraumatic. PERRLA, EOMI. Oropharynx is clear. Neck is nontender and supple without adenopathy or JVD. Back has moderate tenderness in the mid and lower lumbar spine.  There is no CVA tenderness. Lungs are clear without rales, wheezes, or rhonchi. Chest is nontender. Heart has regular rate and rhythm without murmur. Abdomen is soft, flat, nontender without masses or hepatosplenomegaly and peristalsis is normoactive. Extremities: Laceration present lateral aspect of the right lower leg. Skin is warm and  dry without rash. Neurologic: Mental status is normal, cranial nerves are intact.  There is inconsistent effort on motor exam, but no definite motor deficits identified.  Sensory exam is normal.  ED Results / Procedures / Treatments    Radiology DG Lumbar Spine Complete  Result Date: 05/09/2019 CLINICAL DATA:  Fall at home with severe lumbosacral back pain and weakness. EXAM: LUMBAR SPINE - COMPLETE 4+ VIEW COMPARISON:  Radiographs 03/24/2017 FINDINGS: Bones are diffusely under mineralized limiting assessment for acute fracture. No evidence of compression fracture. Facet hypertrophy with mild degenerative disc disease, similar to prior. The sacroiliac joints are congruent. IMPRESSION: 1. Stable degenerative change in the lumbar spine without evidence of acute fracture. 2. Diffuse bony demineralization which limits assessment. Electronically Signed   By: Keith Rake M.D.   On: 05/09/2019 01:13    Procedures .Marland KitchenLaceration Repair  Date/Time: 05/09/2019 2:43 AM Performed by: Delora Fuel, MD Authorized by: Delora Fuel, MD   Consent:    Consent obtained:  Verbal   Consent given by:  Patient   Risks discussed:  Infection, pain, poor cosmetic result and poor wound healing   Alternatives discussed:  No treatment Anesthesia (see MAR for exact dosages):    Anesthesia method:  Local infiltration   Local anesthetic:  Lidocaine 1% w/o epi Laceration details:    Location:  Leg   Leg location:  R lower leg   Length (cm):  16   Depth (mm):  4 Repair type:    Repair type:  Simple Pre-procedure details:    Preparation:  Patient was prepped and draped in usual sterile fashion Exploration:    Hemostasis achieved with:  Direct pressure   Wound exploration: entire depth of wound probed and visualized     Wound extent: no fascia violation noted, no foreign bodies/material noted, no muscle damage noted and no vascular damage noted     Contaminated: no   Treatment:    Area cleansed with:   Saline   Amount of cleaning:  Standard Skin repair:    Repair method:  Staples   Number of staples:  28 Approximation:    Approximation:  Close Post-procedure details:    Dressing:  Antibiotic ointment and sterile dressing   Patient tolerance of procedure:  Tolerated well, no immediate complications   Medications Ordered in ED Medications  lidocaine (PF) (XYLOCAINE) 1 % injection 20 mL (has no administration in time range)  oxyCODONE-acetaminophen (PERCOCET/ROXICET) 5-325 MG per tablet 1 tablet (1 tablet Oral Given 05/08/19 2337)    ED Course  I have reviewed the triage vital signs and the nursing notes.  Pertinent imaging results that were available during my care of the patient were reviewed by me and considered in my medical  decision making (see chart for details).  MDM Rules/Calculators/A&P Fall at home with laceration of right leg and increased pain in the lower back.  He is given a dose of oxycodone-acetaminophen and is sent for x-rays of lumbar spine.  Laceration is closed with staples.  X-rays show no acute fracture.  He is requesting pain medication for use at home.  His record on the [place] controlled substance reporting website was reviewed, and he has not had any narcotic prescriptions in the last 6 months.  He is given a prescription for a small number of hydrocodone-acetaminophen tablets.  Final Clinical Impression(s) / ED Diagnoses Final diagnoses:  Fall at home, initial encounter  Laceration of right lower leg, initial encounter  Chronic midline low back pain without sciatica    Rx / DC Orders ED Discharge Orders         Ordered    HYDROcodone-acetaminophen (NORCO/VICODIN) 5-325 MG tablet  Every 4 hours PRN,   Status:  Discontinued     05/09/19 0242    HYDROcodone-acetaminophen (NORCO/VICODIN) 5-325 MG tablet  Every 4 hours PRN     05/09/19 [id number]           Delora Fuel, MD [id number]

## 2019-05-08 NOTE — ED Triage Notes (Signed)
Patient fell in the home this evening and has a laceration to his right lower leg. Patient states he received shots in his his knees Wednesday at the [place]. Patient also complains of Back pain.

## 2019-05-09 MED ORDER — HYDROCODONE-ACETAMINOPHEN 5-325 MG PO TABS: 1.0000 | ORAL_TABLET | ORAL | 0 refills | Status: DC | PRN

## 2019-05-09 NOTE — Discharge Instructions (Signed)
Return if any signs of infection develop.

## 2019-06-07 ENCOUNTER — Other Ambulatory Visit: Payer: Self-pay

## 2019-06-07 ENCOUNTER — Emergency Department (HOSPITAL_COMMUNITY): Payer: No Typology Code available for payment source

## 2019-06-07 ENCOUNTER — Encounter (HOSPITAL_COMMUNITY): Payer: Self-pay

## 2019-06-07 ENCOUNTER — Observation Stay (HOSPITAL_COMMUNITY)
Admission: EM | Admit: 2019-06-07 | Discharge: 2019-06-08 | Disposition: A | Discharge status: Home / Self Care | Payer: No Typology Code available for payment source | Attending: Internal Medicine | Admitting: Internal Medicine

## 2019-06-07 DIAGNOSIS — T424X1A Poisoning by benzodiazepines, accidental (unintentional), initial encounter: Secondary | ICD-10-CM | POA: Insufficient documentation

## 2019-06-07 DIAGNOSIS — R748 Abnormal levels of other serum enzymes: Secondary | ICD-10-CM | POA: Diagnosis not present

## 2019-06-07 DIAGNOSIS — Z20822 Contact with and (suspected) exposure to covid-19: Secondary | ICD-10-CM | POA: Insufficient documentation

## 2019-06-07 DIAGNOSIS — Z91012 Allergy to eggs: Secondary | ICD-10-CM | POA: Insufficient documentation

## 2019-06-07 DIAGNOSIS — E662 Morbid (severe) obesity with alveolar hypoventilation: Secondary | ICD-10-CM | POA: Insufficient documentation

## 2019-06-07 DIAGNOSIS — T50904A Poisoning by unspecified drugs, medicaments and biological substances, undetermined, initial encounter: Secondary | ICD-10-CM

## 2019-06-07 DIAGNOSIS — R4182 Altered mental status, unspecified: Secondary | ICD-10-CM

## 2019-06-07 DIAGNOSIS — Z6837 Body mass index (BMI) 37.0-37.9, adult: Secondary | ICD-10-CM | POA: Insufficient documentation

## 2019-06-07 DIAGNOSIS — K219 Gastro-esophageal reflux disease without esophagitis: Secondary | ICD-10-CM | POA: Diagnosis present

## 2019-06-07 DIAGNOSIS — Z887 Allergy status to serum and vaccine status: Secondary | ICD-10-CM | POA: Diagnosis not present

## 2019-06-07 DIAGNOSIS — Z79899 Other long term (current) drug therapy: Secondary | ICD-10-CM | POA: Diagnosis not present

## 2019-06-07 DIAGNOSIS — G92 Toxic encephalopathy: Secondary | ICD-10-CM | POA: Insufficient documentation

## 2019-06-07 DIAGNOSIS — T578X1A Toxic effect of other specified inorganic substances, accidental (unintentional), initial encounter: Secondary | ICD-10-CM | POA: Diagnosis not present

## 2019-06-07 DIAGNOSIS — T400X1A Poisoning by opium, accidental (unintentional), initial encounter: Secondary | ICD-10-CM | POA: Diagnosis not present

## 2019-06-07 DIAGNOSIS — I10 Essential (primary) hypertension: Secondary | ICD-10-CM | POA: Diagnosis not present

## 2019-06-07 DIAGNOSIS — F329 Major depressive disorder, single episode, unspecified: Secondary | ICD-10-CM | POA: Diagnosis not present

## 2019-06-07 DIAGNOSIS — F32A Depression, unspecified: Secondary | ICD-10-CM

## 2019-06-07 LAB — COMPREHENSIVE METABOLIC PANEL
ALT: 71 U/L — ABNORMAL HIGH (ref 0–44)
AST: 54 U/L — ABNORMAL HIGH (ref 15–41)
Albumin: 3.6 g/dL (ref 3.5–5.0)
Alkaline Phosphatase: 100 U/L (ref 38–126)
Anion gap: 7 (ref 5–15)
BUN: 17 mg/dL (ref 8–23)
CO2: 24 mmol/L (ref 22–32)
Calcium: 8.4 mg/dL — ABNORMAL LOW (ref 8.9–10.3)
Chloride: 111 mmol/L (ref 98–111)
Creatinine, Ser: 0.82 mg/dL (ref 0.61–1.24)
GFR calc Af Amer: >60 mL/min (ref >60)
GFR calc non Af Amer: >60 mL/min (ref >60)
Glucose, Bld: 132 mg/dL — ABNORMAL HIGH (ref 70–99)
Potassium: 4.6 mmol/L (ref 3.5–5.1)
Sodium: 142 mmol/L (ref 135–145)
Total Bilirubin: 0.7 mg/dL (ref 0.3–1.2)
Total Protein: 6.3 g/dL — ABNORMAL LOW (ref 6.5–8.1)

## 2019-06-07 LAB — CBC
HCT: 41.3 % (ref 39.0–52.0)
Hemoglobin: 13 g/dL (ref 13.0–17.0)
MCH: 27.7 pg (ref 26.0–34.0)
MCHC: 31.5 g/dL (ref 30.0–36.0)
MCV: 87.9 fL (ref 80.0–100.0)
Platelets: 212 10*3/uL (ref 150–400)
RBC: 4.7 MIL/uL (ref 4.22–5.81)
RDW: 15 % (ref 11.5–15.5)
WBC: 13.6 10*3/uL — ABNORMAL HIGH (ref 4.0–10.5)
nRBC: 0 % (ref 0.0–0.2)

## 2019-06-07 LAB — URINALYSIS, ROUTINE W REFLEX MICROSCOPIC
Bilirubin Urine: NEGATIVE
Glucose, UA: NEGATIVE mg/dL
Hgb urine dipstick: NEGATIVE
Ketones, ur: NEGATIVE mg/dL
Leukocytes,Ua: NEGATIVE
Nitrite: NEGATIVE
Protein, ur: NEGATIVE mg/dL
Specific Gravity, Urine: 1.027 (ref 1.005–1.030)
pH: 5 (ref 5.0–8.0)

## 2019-06-07 LAB — BLOOD GAS, VENOUS
Acid-base deficit: 1.3 mmol/L (ref 0.0–2.0)
Bicarbonate: 22.9 mmol/L (ref 20.0–28.0)
FIO2: 21
O2 Saturation: 81.6 %
Patient temperature: 37.4
pCO2, Ven: 41 mmHg — ABNORMAL LOW (ref 44.0–60.0)
pH, Ven: 7.371 (ref 7.250–7.430)
pO2, Ven: 47.7 mmHg — ABNORMAL HIGH (ref 32.0–45.0)

## 2019-06-07 LAB — LACTIC ACID, PLASMA: Lactic Acid, Venous: 1.8 mmol/L (ref 0.5–1.9)

## 2019-06-07 LAB — RAPID URINE DRUG SCREEN, HOSP PERFORMED
Amphetamines: NOT DETECTED
Barbiturates: NOT DETECTED
Benzodiazepines: POSITIVE — AB
Cocaine: NOT DETECTED
Opiates: POSITIVE — AB
Tetrahydrocannabinol: NOT DETECTED

## 2019-06-07 LAB — RESPIRATORY PANEL BY RT PCR (FLU A&B, COVID)
Influenza A by PCR: NEGATIVE
Influenza B by PCR: NEGATIVE
SARS Coronavirus 2 by RT PCR: NEGATIVE

## 2019-06-07 LAB — SALICYLATE LEVEL: Salicylate Lvl: <7 mg/dL — ABNORMAL LOW (ref 7.0–30.0)

## 2019-06-07 LAB — AMMONIA: Ammonia: 24 umol/L (ref 9–35)

## 2019-06-07 LAB — ACETAMINOPHEN LEVEL: Acetaminophen (Tylenol), Serum: <10 ug/mL — ABNORMAL LOW (ref 10–30)

## 2019-06-07 LAB — ETHANOL: Alcohol, Ethyl (B): <10 mg/dL (ref <10)

## 2019-06-07 MED ORDER — SODIUM CHLORIDE 0.9 % IV BOLUS
1000.0000 mL | Freq: Once | INTRAVENOUS | Status: AC
  Administered 2019-06-07: 1000 mL via INTRAVENOUS

## 2019-06-07 NOTE — ED Triage Notes (Signed)
Pt lives by himself, tonight when family went to check on him, they found him to be altered mentally.   Family voiced concerns for overdose of either opiates or xanax.  Pt is awake and alert, confused.

## 2019-06-07 NOTE — ED Notes (Addendum)
Pt back from CT, ben with lab came by to collect blood, informed pt was it CT - will inform Suezanne Jacquet that pt is back from CT

## 2019-06-07 NOTE — ED Notes (Addendum)
ED Provider at bedside. Dr. Bero 

## 2019-06-07 NOTE — ED Provider Notes (Signed)
[facility] Emergency Department Provider Note MRN:  [id number]  Arrival date & time: 06/07/19     Chief Complaint   Altered Mental Status   History of Present Illness   Jason Holt. is a 74 y.o. year-old male with a history of hypertension, depression, chronic knee pain presenting to the ED with chief complaint of altered mental status.  Patient reportedly lives at home and was checked on by family today, found to be significantly altered.  Here for evaluation.  Patient does not know where he is.  I was unable to obtain an accurate HPI, PMH, or ROS due to the patient's altered mental status.  Level 5 caveat.  Review of Systems  Positive for altered mental status.  Patient's Health History    Past Medical History:  Diagnosis Date  . Acid reflux   . Chronic knee pain   . Depression   . Hypertension   . Ruptured lumbar disc     Past Surgical History:  Procedure Laterality Date  . KNEE SURGERY    . LAPAROTOMY  02/18/2011   Procedure: EXPLORATORY LAPAROTOMY;  Surgeon: Jamesetta So;  Location: [facility];  Service: General;  Laterality: N/A;  Gastrorraphy    No family history on file.  Social History   Socioeconomic History  . Marital status: Widowed    Spouse name: Not on file  . Number of children: Not on file  . Years of education: Not on file  . Highest education level: Not on file  Occupational History  . Not on file  Tobacco Use  . Smoking status: Never Smoker  . Smokeless tobacco: Never Used  Substance and Sexual Activity  . Alcohol use: No  . Drug use: No  . Sexual activity: Not Currently  Other Topics Concern  . Not on file  Social History Narrative  . Not on file   Social Determinants of Health   Financial Resource Strain:   . Difficulty of Paying Living Expenses: Not on file  Food Insecurity:   . Worried About Charity fundraiser in the Last Year: Not on file  . Ran Out of Food in the Last Year: Not on file   Transportation Needs:   . Lack of Transportation (Medical): Not on file  . Lack of Transportation (Non-Medical): Not on file  Physical Activity:   . Days of Exercise per Week: Not on file  . Minutes of Exercise per Session: Not on file  Stress:   . Feeling of Stress : Not on file  Social Connections:   . Frequency of Communication with Friends and Family: Not on file  . Frequency of Social Gatherings with Friends and Family: Not on file  . Attends Religious Services: Not on file  . Active Member of Clubs or Organizations: Not on file  . Attends Archivist Meetings: Not on file  . Marital Status: Not on file  Intimate Partner Violence:   . Fear of Current or Ex-Partner: Not on file  . Emotionally Abused: Not on file  . Physically Abused: Not on file  . Sexually Abused: Not on file     Physical Exam   Vitals:   06/07/19 2100 06/07/19 2130  BP: 130/87 107/74  Pulse: (!) 102 98  Resp: 20 20  Temp:    SpO2: 96% 95%    CONSTITUTIONAL: Well-appearing, NAD NEURO: Somnolent, wakes to voice, oriented to name only, moving all extremities EYES:  eyes equal and reactive ENT/NECK:  no LAD, no JVD CARDIO: Regular rate, well-perfused, normal S1 and S2 PULM:  CTAB no wheezing or rhonchi GI/GU:  normal bowel sounds, non-distended, non-tender MSK/SPINE:  No gross deformities, no edema SKIN:  no rash, atraumatic PSYCH:  Appropriate speech and behavior  *Additional and/or pertinent findings included in MDM below  Diagnostic and Interventional Summary    EKG Interpretation  Date/Time:  Thursday June 07 2019 19:32:03 EST Ventricular Rate:  106 PR Interval:    QRS Duration: 125 QT Interval:  352 QTC Calculation: 468 R Axis:   -41 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 Reconfirmed by Gerlene Fee [id number]) on 06/07/2019 8:48:59 PM      Cardiac Monitoring Interpretation:  Labs Reviewed  CBC - Abnormal; Notable for the following components:      Result  Value   WBC 13.6 (*)    All other components within normal limits  COMPREHENSIVE METABOLIC PANEL - Abnormal; Notable for the following components:   Glucose, Bld 132 (*)    Calcium 8.4 (*)    Total Protein 6.3 (*)    AST 54 (*)    ALT 71 (*)    All other components within normal limits  ACETAMINOPHEN LEVEL - Abnormal; Notable for the following components:   Acetaminophen (Tylenol), Serum <10 (*)    All other components within normal limits  SALICYLATE LEVEL - Abnormal; Notable for the following components:   Salicylate Lvl Q000111Q (*)    All other components within normal limits  BLOOD GAS, VENOUS - Abnormal; Notable for the following components:   pCO2, Ven 41.0 (*)    pO2, Ven 47.7 (*)    All other components within normal limits  CULTURE, BLOOD (SINGLE)  RESPIRATORY PANEL BY RT PCR (FLU A&B, COVID)  LACTIC ACID, PLASMA  ETHANOL  AMMONIA  URINALYSIS, ROUTINE W REFLEX MICROSCOPIC  RAPID URINE DRUG SCREEN, HOSP PERFORMED    DG Chest Port 1 View  Final Result    CT HEAD WO CONTRAST  Final Result      Medications  sodium chloride 0.9 % bolus 1,000 mL (1,000 mLs Intravenous New Bag/Given 06/07/19 2130)     Procedures  /  Critical Care Procedures  ED Course and Medical Decision Making  I have reviewed the triage vital signs, the nursing notes, and pertinent available records from the EMR.  Pertinent labs & imaging results that were available during my care of the patient were reviewed by me and considered in my medical decision making (see below for details).     Altered mental status, unclear etiology, considering metabolic disarray, subdural hematoma, drug overdose, will attempt to reach family.  10 PM update: Discussed with family, family is concerned that patient has been swapping medications with his "[facility] bud[facility]".  They are concerned that he "got his hands on some benzos".  Patient has a history of depression but family does not feel he was trying to harm himself.   Work-up is thus far unrevealing with no other explanation for his altered mental status.  Awaiting Covid test, urinalysis.  Minimally elevated LFTs but no right upper quadrant tenderness, no indication for abdominal imaging.  Without indication for admission, patient will need further metabolization here in the emergency department, followed by reevaluation of his mental health.  Signed out to oncoming provider at shift change.  Barth Kirks. Sedonia Small, MD [place] [email or web address]  Final Clinical Impressions(s) / ED Diagnoses     ICD-10-CM   1. Altered mental  status, unspecified altered mental status type  R41.82   2. Drug overdose, undetermined intent, initial encounter  [place]     ED Discharge Orders    None       Discharge Instructions Discussed with and Provided to Patient:   Discharge Instructions   None       Maudie Flakes, MD 06/07/19 2219

## 2019-06-08 DIAGNOSIS — E669 Obesity, unspecified: Secondary | ICD-10-CM | POA: Diagnosis not present

## 2019-06-08 DIAGNOSIS — R748 Abnormal levels of other serum enzymes: Secondary | ICD-10-CM

## 2019-06-08 DIAGNOSIS — I10 Essential (primary) hypertension: Secondary | ICD-10-CM

## 2019-06-08 DIAGNOSIS — G92 Toxic encephalopathy: Secondary | ICD-10-CM | POA: Diagnosis not present

## 2019-06-08 DIAGNOSIS — R5381 Other malaise: Secondary | ICD-10-CM

## 2019-06-08 DIAGNOSIS — R400 Somnolence: Secondary | ICD-10-CM | POA: Diagnosis not present

## 2019-06-08 DIAGNOSIS — F32A Depression, unspecified: Secondary | ICD-10-CM

## 2019-06-08 DIAGNOSIS — R410 Disorientation, unspecified: Secondary | ICD-10-CM

## 2019-06-08 DIAGNOSIS — F329 Major depressive disorder, single episode, unspecified: Secondary | ICD-10-CM | POA: Diagnosis not present

## 2019-06-08 DIAGNOSIS — R4182 Altered mental status, unspecified: Secondary | ICD-10-CM

## 2019-06-08 MED ORDER — GABAPENTIN 300 MG PO CAPS: 300.0000 mg | ORAL_CAPSULE | Freq: Three times a day (TID) | ORAL | 1 refills | Status: AC

## 2019-06-08 MED ORDER — SODIUM CHLORIDE 0.9% FLUSH: 3.0000 mL | INTRAVENOUS | Status: DC | PRN

## 2019-06-08 MED ORDER — LACTATED RINGERS IV SOLN: INTRAVENOUS | Status: DC

## 2019-06-08 MED ORDER — SODIUM CHLORIDE 0.9% FLUSH: 3.0000 mL | Freq: Two times a day (BID) | INTRAVENOUS | Status: DC

## 2019-06-08 MED ORDER — PANTOPRAZOLE SODIUM 40 MG PO TBEC
40.0000 mg | DELAYED_RELEASE_TABLET | Freq: Every day | ORAL | Status: DC
  Administered 2019-06-08: 40 mg via ORAL
  Filled 2019-06-08: qty 1

## 2019-06-08 MED ORDER — ENOXAPARIN SODIUM 40 MG/0.4ML ~~LOC~~ SOLN
40.0000 mg | SUBCUTANEOUS | Status: DC
  Administered 2019-06-08: 40 mg via SUBCUTANEOUS
  Filled 2019-06-08: qty 0.4

## 2019-06-08 MED ORDER — LISINOPRIL 5 MG PO TABS
5.0000 mg | ORAL_TABLET | Freq: Every day | ORAL | Status: DC
  Administered 2019-06-08: 10:00:00 5 mg via ORAL
  Filled 2019-06-08: qty 1

## 2019-06-08 MED ORDER — ACETAMINOPHEN 325 MG PO TABS: 650.0000 mg | ORAL_TABLET | Freq: Four times a day (QID) | ORAL | Status: DC | PRN

## 2019-06-08 MED ORDER — BISACODYL 10 MG RE SUPP: 10.0000 mg | Freq: Every day | RECTAL | Status: DC | PRN

## 2019-06-08 MED ORDER — DIAZEPAM 5 MG PO TABS
10.0000 mg | ORAL_TABLET | Freq: Once | ORAL | Status: AC
  Administered 2019-06-08: 10 mg via ORAL
  Filled 2019-06-08: qty 2

## 2019-06-08 MED ORDER — POLYETHYLENE GLYCOL 3350 17 G PO PACK: 17.0000 g | PACK | Freq: Every day | ORAL | Status: DC | PRN

## 2019-06-08 MED ORDER — ACETAMINOPHEN 650 MG RE SUPP: 650.0000 mg | Freq: Four times a day (QID) | RECTAL | Status: DC | PRN

## 2019-06-08 MED ORDER — TRAMADOL HCL 50 MG PO TABS: 50.0000 mg | ORAL_TABLET | Freq: Three times a day (TID) | ORAL | Status: DC | PRN

## 2019-06-08 MED ORDER — ACETAMINOPHEN 500 MG PO TABS: 1000.0000 mg | ORAL_TABLET | Freq: Three times a day (TID) | ORAL | 2 refills | Status: AC | PRN

## 2019-06-08 MED ORDER — SODIUM CHLORIDE 0.9 % IV SOLN: 250.0000 mL | INTRAVENOUS | Status: DC | PRN

## 2019-06-08 MED ORDER — DOCUSATE SODIUM 100 MG PO CAPS: 100.0000 mg | ORAL_CAPSULE | Freq: Two times a day (BID) | ORAL | Status: DC | PRN

## 2019-06-08 MED ORDER — ONDANSETRON HCL 4 MG/2ML IJ SOLN: 4.0000 mg | Freq: Four times a day (QID) | INTRAMUSCULAR | Status: DC | PRN

## 2019-06-08 MED ORDER — ONDANSETRON HCL 4 MG PO TABS: 4.0000 mg | ORAL_TABLET | Freq: Four times a day (QID) | ORAL | Status: DC | PRN

## 2019-06-08 NOTE — Progress Notes (Signed)
Nsg Discharge Note  Admit Date:  06/07/2019 Discharge date: 06/08/2019   Jason Holt. to be D/C'd Home per MD order.  AVS completed.  Copy for chart, and copy for patient signed, and dated. Patient/caregiver able to verbalize understanding.  Discharge Medication: Allergies as of 06/08/2019      Reactions   Eggs Or Egg-derived Products Hives   Influenza Virus Vacc Split Pf Hives   Dye Fdc Red [red Dye]       Medication List    STOP taking these medications   HYDROcodone-acetaminophen 5-325 MG tablet Commonly known as: NORCO/VICODIN   levofloxacin 750 MG tablet Commonly known as: LEVAQUIN     TAKE these medications   acetaminophen 500 MG tablet Commonly known as: TYLENOL Take 2 tablets (1,000 mg total) by mouth every 8 (eight) hours as needed for mild pain or moderate pain.   docusate sodium 100 MG capsule Commonly known as: COLACE Take 1 capsule (100 mg total) by mouth every 12 (twelve) hours. What changed:   when to take this  reasons to take this   gabapentin 300 MG capsule Commonly known as: NEURONTIN Take 1 capsule (300 mg total) by mouth 3 (three) times daily.   lisinopril 5 MG tablet Commonly known as: ZESTRIL Take 5 mg daily by mouth.   pantoprazole 40 MG tablet Commonly known as: PROTONIX Take 40 mg by mouth daily.            Durable Medical Equipment  (From admission, onward)         Start     Ordered   06/08/19 1338  For home use only DME 3 n 1  Once     06/08/19 1337   06/08/19 1238  For home use only DME Walker rolling  Once    Question Answer Comment  Walker: With 5 Inch Wheels   Patient needs a walker to treat with the following condition Unsteady gait      06/08/19 1237          Discharge Assessment: Vitals:   06/08/19 1300 06/08/19 1403  BP: 121/62 137/66  Pulse:  87  Resp:  20  Temp:  98.3 F (36.8 C)  SpO2:  97%   Skin clean, dry and intact without evidence of skin break down, no evidence of skin tears  noted. IV catheter discontinued intact. Site without signs and symptoms of complications - no redness or edema noted at insertion site, patient denies c/o pain - only slight tenderness at site.  Dressing with slight pressure applied.  D/c Instructions-Education: Discharge instructions given to patient/family with verbalized understanding. D/c education completed with patient/family including follow up instructions, medication list, d/c activities limitations if indicated, with other d/c instructions as indicated by MD - patient able to verbalize understanding, all questions fully answered. Patient instructed to return to ED, call 911, or call MD for any changes in condition.  Patient escorted via [place], and D/C home via private auto.  Zenaida Deed, RN 06/08/2019 5:17 PM

## 2019-06-08 NOTE — Discharge Summary (Signed)
Physician Discharge Summary  Jason Holt Jason Holt. [id number] DOB: 1946/01/27 DOA: 06/07/2019  PCP: [facility]  Admit date: 06/07/2019 Discharge date: 06/08/2019  Admitted From: Home.  Lives alone.  Family lives close by. Disposition: Home.  Recommendations for Outpatient Follow-up:  1. Follow ups as below. 2. Please obtain CBC/BMP/Mag at follow up 3. Please follow up on the following pending results: None  Home Health: PT/OT Equipment/Devices: Rolling walker and 3 in 1 commode  Discharge Condition: Stable CODE STATUS: Full code  Follow-up Information    [facility]. Schedule an appointment as soon as possible for a visit in 1 week(s).   Contact information: [place] [address] [phone]          Hospital Course: 74 year old male with history of HTN, depression, chronic pain and GERD presented with altered mental status and admitted for encephalopathy thought to be due to benzodiazepines and opiates.  There was concern by family that patient has been getting Valium and opiates from his friend.   In ED, hemodynamically stable.  Good saturation on room air.  WBC 13.6.  AST 54.  ALT 71.  Otherwise, CBC and CMP without significant finding.  CT head negative for acute finding.  CXR with mild left lower lobe opacity suggestive for atelectasis versus infiltrate.  COVID-19 and influenza PCR negative.  Blood culture negative so far.  UDS positive for benzos and opiates.  Per narcotic database review, patient recently filled Norco and tramadol but no benzodiazepines.  The next day, patient's mental status improved.  He is oriented x4 except date.  Evaluated by PT/OT who recommended home health with 24-hour supervision, and DME.  Discussed with patient's daughter, Jason Holt over the phone who stated family can support 24-hour supervision.  I have renewed his gabapentin which would help him with possible withdrawal from  benzodiazepines.  Advised to use only his medication as prescribed by doctors.  Patient to follow-up with PCP in 1 to 2 weeks.  See individual problem list below for more hospital course. Discharge Diagnoses:  Acute toxic encephalopathy: Likely due to opiate and benzo.  Resolved. counseled to use only his medications as prescribed by his doctor. -Renewed his gabapentin for potential withdrawal from benzo. -Recommended Tylenol every 8 hours for pain.  Essential hypertension: Normotensive -Discharged on home medications.  Depression: Stable  Left lower extremity wound: Appears stable.  No signs of infection.  Follow[facility] at the [facility]. -Outpatient follow-up  Elevated liver enzymes -Repeat CMP at follow-up.  Class II obesity Body mass index is 37.02 kg/m. -Encourage lifestyle change to lose weight.  Family communication: Updated patient's daughter over the phone prior to discharge.  Discharge Instructions  Discharge Instructions    Call MD for:  difficulty breathing, headache or visual disturbances   Complete by: As directed    Call MD for:  extreme fatigue   Complete by: As directed    Call MD for:  persistant dizziness or light-headedness   Complete by: As directed    Call MD for:  severe uncontrolled pain   Complete by: As directed    Call MD for:  temperature >100.4   Complete by: As directed    Diet general   Complete by: As directed    Discharge instructions   Complete by: As directed    It has been a pleasure taking care of you! You were hospitalized and treated for altered mental status/confusion likely due to some sedating medications you have taken.  We  strongly encourage you not to take any medication other than what has been prescribed to you by your doctor.  Please review your new medication list and the directions before you take your medications.  Please follow-up with your primary care doctor in 1 to 2 weeks.  You may have to make a phone call as soon as  possible to schedule this follow-up  Take care,   Increase activity slowly   Complete by: As directed      Allergies as of 06/08/2019      Reactions   Eggs Or Egg-derived Products Hives   Influenza Virus Vacc Split Pf Hives   Dye Fdc Red [red Dye]       Medication List    STOP taking these medications   HYDROcodone-acetaminophen 5-325 MG tablet Commonly known as: NORCO/VICODIN   levofloxacin 750 MG tablet Commonly known as: LEVAQUIN     TAKE these medications   acetaminophen 500 MG tablet Commonly known as: TYLENOL Take 2 tablets (1,000 mg total) by mouth every 8 (eight) hours as needed for mild pain or moderate pain.   docusate sodium 100 MG capsule Commonly known as: COLACE Take 1 capsule (100 mg total) by mouth every 12 (twelve) hours. What changed:   when to take this  reasons to take this   gabapentin 300 MG capsule Commonly known as: NEURONTIN Take 1 capsule (300 mg total) by mouth 3 (three) times daily.   lisinopril 5 MG tablet Commonly known as: ZESTRIL Take 5 mg daily by mouth.   pantoprazole 40 MG tablet Commonly known as: PROTONIX Take 40 mg by mouth daily.            Durable Medical Equipment  (From admission, onward)         Start     Ordered   06/08/19 1338  For home use only DME 3 n 1  Once     06/08/19 1337   06/08/19 1238  For home use only DME Walker rolling  Once    Question Answer Comment  Walker: With 5 Inch Wheels   Patient needs a walker to treat with the following condition Unsteady gait      06/08/19 1237          Consultations:  None  Procedures/Studies:   CT HEAD WO CONTRAST  Result Date: 06/07/2019 CLINICAL DATA:  Altered mental status EXAM: CT HEAD WITHOUT CONTRAST TECHNIQUE: Contiguous axial images were obtained from the base of the skull through the vertex without intravenous contrast. COMPARISON:  CT 02/25/2011 FINDINGS: Brain: No acute territorial infarction, hemorrhage or intracranial. Mild atrophy.  Stable ventricle size. Vascular: No hyperdense vessel.  Carotid vascular calcification Skull: Normal. Negative for fracture or focal lesion. Sinuses/Orbits: No acute finding. Other: None IMPRESSION: 1. No CT evidence for acute intracranial abnormality. 2. Mild atrophy Electronically Signed   By: Donavan Foil M.D.   On: 06/07/2019 21:03   DG Chest Port 1 View  Result Date: 06/07/2019 CLINICAL DATA:  Altered mental status EXAM: PORTABLE CHEST 1 VIEW COMPARISON:  03/11/2011 FINDINGS: Low lung volumes. Cardiomegaly. Patchy airspace disease at the left base. No pneumothorax. IMPRESSION: 1. Low lung volumes with cardiomegaly 2. Patchy airspace disease at the left base, atelectasis versus mild pneumonia. Electronically Signed   By: Donavan Foil M.D.   On: 06/07/2019 21:08      Discharge Exam: Vitals:   06/08/19 1300 06/08/19 1403  BP: 121/62 137/66  Pulse:  87  Resp:  20  Temp:  98.3  F (36.8 C)  SpO2:  97%    GENERAL: No acute distress.  Appears well.  HEENT: MMM.  Vision and hearing grossly intact.  NECK: Supple.  No apparent JVD.  RESP:  No IWOB. Good air movement bilaterally. CVS:  RRR. Heart sounds normal.  ABD/GI/GU: Bowel sounds present. Soft. Non tender.  MSK/EXT:  Moves extremities. No apparent deformity or edema.  SKIN: Skin wound over lateral aspect of right leg.  No apparent drainage or signs of infection. NEURO: Awake, alert and oriented x4 - days.  Mild tremor.  No apparent focal neuro deficit. PSYCH: Calm. Normal affect.   The results of significant diagnostics from this hospitalization (including imaging, microbiology, ancillary and laboratory) are listed below for reference.     Microbiology: Recent Results (from the past 240 hour(s))  Culture, blood (single) w Reflex to ID Panel     Status: None (Preliminary result)   Collection Time: 06/07/19  9:16 PM   Specimen: BLOOD LEFT ARM  Result Value Ref Range Status   Specimen Description BLOOD LEFT ARM  Final    Special Requests   Final    BOTTLES DRAWN AEROBIC AND ANAEROBIC Blood Culture adequate volume   Culture   Final    NO GROWTH < 12 HOURS Performed at [facility], [address], [place], [place] [address]    Report Status PENDING  Incomplete  Respiratory Panel by RT PCR (Flu A&B, Covid) - Nasopharyngeal Swab     Status: None   Collection Time: 06/07/19 11:00 PM   Specimen: Nasopharyngeal Swab  Result Value Ref Range Status   SARS Coronavirus 2 by RT PCR NEGATIVE NEGATIVE Final    Comment: (NOTE) SARS-CoV-2 target nucleic acids are NOT DETECTED. The SARS-CoV-2 RNA is generally detectable in upper respiratoy specimens during the acute phase of infection. The lowest concentration of SARS-CoV-2 viral copies this assay can detect is 131 copies/mL. A negative result does not preclude SARS-Cov-2 infection and should not be used as the sole basis for treatment or other patient management decisions. A negative result may occur with  improper specimen collection/handling, submission of specimen other than nasopharyngeal swab, presence of viral mutation(s) within the areas targeted by this assay, and inadequate number of viral copies (<131 copies/mL). A negative result must be combined with clinical observations, patient history, and epidemiological information. The expected result is Negative. Fact Sheet for Patients:  PinkCheek.be Fact Sheet for Healthcare Providers:  GravelBags.it This test is not yet ap proved or cleared by the [place] FDA and  has been authorized for detection and/or diagnosis of SARS-CoV-2 by FDA under an Emergency Use Authorization (EUA). This EUA will remain  in effect (meaning this test can be used) for the duration of the COVID-19 declaration under Section 564(b)(1) of the Act, 21 U.S.C. section 360bbb-3(b)(1), unless the authorization is terminated or revoked sooner.    Influenza A by PCR NEGATIVE  NEGATIVE Final   Influenza B by PCR NEGATIVE NEGATIVE Final    Comment: (NOTE) The Xpert Xpress SARS-CoV-2/FLU/RSV assay is intended as an aid in  the diagnosis of influenza from Nasopharyngeal swab specimens and  should not be used as a sole basis for treatment. Nasal washings and  aspirates are unacceptable for Xpert Xpress SARS-CoV-2/FLU/RSV  testing. Fact Sheet for Patients: PinkCheek.be Fact Sheet for Healthcare Providers: GravelBags.it This test is not yet approved or cleared by the [place] FDA and  has been authorized for detection and/or diagnosis of SARS-CoV-2 by  FDA under an Emergency  Use Authorization (EUA). This EUA will remain  in effect (meaning this test can be used) for the duration of the  Covid-19 declaration under Section 564(b)(1) of the Act, 21  U.S.C. section 360bbb-3(b)(1), unless the authorization is  terminated or revoked. Performed at [facility], [address], [place], [place] [address]      Labs: BNP (last 3 results) No results for input(s): BNP in the last 8760 hours. Basic Metabolic Panel: Recent Labs  Lab 06/07/19 2116  NA 142  K 4.6  CL 111  CO2 24  GLUCOSE 132*  BUN 17  CREATININE 0.82  CALCIUM 8.4*   Liver Function Tests: Recent Labs  Lab 06/07/19 2116  AST 54*  ALT 71*  ALKPHOS 100  BILITOT 0.7  PROT 6.3*  ALBUMIN 3.6   No results for input(s): LIPASE, AMYLASE in the last 168 hours. Recent Labs  Lab 06/07/19 2116  AMMONIA 24   CBC: Recent Labs  Lab 06/07/19 2116  WBC 13.6*  HGB 13.0  HCT 41.3  MCV 87.9  PLT 212   Cardiac Enzymes: No results for input(s): CKTOTAL, CKMB, CKMBINDEX, TROPONINI in the last 168 hours. BNP: Invalid input(s): POCBNP CBG: No results for input(s): GLUCAP in the last 168 hours. D-Dimer No results for input(s): DDIMER in the last 72 hours. Hgb A1c No results for input(s): HGBA1C in the last 72 hours. Lipid Profile No  results for input(s): CHOL, HDL, LDLCALC, TRIG, CHOLHDL, LDLDIRECT in the last 72 hours. Thyroid function studies No results for input(s): TSH, T4TOTAL, T3FREE, THYROIDAB in the last 72 hours.  Invalid input(s): FREET3 Anemia work up No results for input(s): VITAMINB12, FOLATE, FERRITIN, TIBC, IRON, RETICCTPCT in the last 72 hours. Urinalysis    Component Value Date/Time   COLORURINE YELLOW 06/07/2019 2022   APPEARANCEUR HAZY (A) 06/07/2019 2022   LABSPEC 1.027 06/07/2019 2022   PHURINE 5.0 06/07/2019 2022   GLUCOSEU NEGATIVE 06/07/2019 2022   HGBUR NEGATIVE 06/07/2019 2022   BILIRUBINUR NEGATIVE 06/07/2019 2022   KETONESUR NEGATIVE 06/07/2019 2022   PROTEINUR NEGATIVE 06/07/2019 2022   UROBILINOGEN 1.0 03/05/2011 1822   NITRITE NEGATIVE 06/07/2019 2022   LEUKOCYTESUR NEGATIVE 06/07/2019 2022   Sepsis Labs Invalid input(s): PROCALCITONIN,  WBC,  LACTICIDVEN   Time coordinating discharge: 35 minutes  SIGNED:  Mercy Riding, MD  [facility] 06/08/2019, 5:32 PM  If 7PM-7AM, please contact night-coverage [email or web address] Password [email or web address]

## 2019-06-08 NOTE — ED Provider Notes (Signed)
Care assumed from Dr. Sedonia Small, patient found at home with altered mentation, ED work-up negative except for presence of benzodiazepines in urine drug screen.  Family concerned that he may have been getting benzodiazepines from friends.  Plan was to observe him to see if mentation improved with metabolizing what ever might be in his system.  He was observed for 9 hours in the ED and I went in to reevaluate him.  He is noted to be awake but somewhat agitated and tremulous.  He knows he is in [place] and knows it is 2021 but is not able to identify that he is in the hospital and cannot tell me the month.  Clearly, he is not back to his baseline.  Case is discussed with Dr. Scherrie November of [facility], who agrees to admit the patient.   Delora Fuel, MD [phone]

## 2019-06-08 NOTE — Evaluation (Signed)
Physical Therapy Evaluation Patient Details Name: Jason Holt. MRN: [id number] DOB: 09-17-1945 Today's Date: 06/08/2019   History of Present Illness  PT brought to ER  due to alterned mental status  Clinical Impression  PT seen for evaluation.  Pt states that he normally uses a cane to ambulate at home.   Family who live next door assist him.  At this time pt is able to ambulate with a RW for 24ft prior to fatigue.  He is taking short, quick shuffling steps which increase his risk  For a fall but states that this is his normal gait.  Pt will benefit for 24 hr supervision for the next\ Few days and HH PT     Follow Up Recommendations Home health PT    Equipment Recommendations  Rolling walker with 5" wheels    Recommendations for Other Services   none    Precautions / Restrictions Precautions Precautions: Fall Restrictions Weight Bearing Restrictions: No      Mobility  Bed Mobility Overal bed mobility: Needs Assistance Bed Mobility: Supine to Sit;Sit to Supine     Supine to sit: Min assist Sit to supine: Min assist   General bed mobility comments: ER bed is high pt unable to sit and touch floor  Transfers Overall transfer level: Modified independent Equipment used: Rolling walker (2 wheeled)                Ambulation/Gait Ambulation/Gait assistance: Supervision Gait Distance (Feet): 50 Feet Assistive device: Rolling walker (2 wheeled)       General Gait Details: decreased step length B , quick shufflling steps.   Stairs            Wheelchair Mobility    Modified Rankin (Stroke Patients Only)             Pertinent Vitals/Pain Pain Assessment: No/denies pain    Home Living Family/patient expects to be discharged to:: Private residence Living Arrangements: Alone(family lives next door; not present ) Available Help at Discharge: Family                  Prior Function Level of Independence: Independent with assistive  device(s);Needs assistance         Comments: family does shopping for pt      Hand Dominance        Extremity/Trunk Assessment        Lower Extremity Assessment Lower Extremity Assessment: Generalized weakness       Communication   Communication: No difficulties  Cognition Arousal/Alertness: Awake/alert Behavior During Therapy: WFL for tasks assessed/performed Overall Cognitive Status: Within Functional Limits for tasks assessed                                               Assessment/Plan    PT Assessment Patient needs continued PT services  PT Problem List         PT Treatment Interventions Gait training;Therapeutic exercise;Balance training    PT Goals (Current goals can be found in the Care Plan section)  Acute Rehab PT Goals Patient Stated Goal: To go home he is unsure why he is here states that he feels pretty good  Time For Goal Achievement: 06/08/19 Potential to Achieve Goals: Good    Frequency Min 3X/week   Barriers to discharge   Pt will need 24hr supervision for safety at this  time.  Recommend  HH with supervision for the first couple of days. Pt feels family will be able to do this     Co-evaluation PT/OT/SLP Co-Evaluation/Treatment: Yes   PT goals addressed during session: Mobility/safety with mobility         AM-PAC PT "6 Clicks" Mobility  Outcome Measure Help needed turning from your back to your side while in a flat bed without using bedrails?: A Little Help needed moving from lying on your back to sitting on the side of a flat bed without using bedrails?: A Little Help needed moving to and from a bed to a chair (including a wheelchair)?: A Little Help needed standing up from a chair using your arms (e.g., wheelchair or bedside chair)?: None Help needed to walk in hospital room?: A Little Help needed climbing 3-5 steps with a railing? : A Lot 6 Click Score: 18    End of Session Equipment Utilized During  Treatment: Gait belt Activity Tolerance: Patient limited by fatigue Patient left: in bed;with call bell/phone within reach   PT Visit Diagnosis: Unsteadiness on feet (R26.81);Other abnormalities of gait and mobility (R26.89);Muscle weakness (generalized) (M62.81)    Time: [id number] PT Time Calculation (min) (ACUTE ONLY): 42 min   Charges:   PT Evaluation $PT Eval Low Complexity: [place], PT CLT [phone] 06/08/2019, 12:00 PM

## 2019-06-08 NOTE — H&P (Addendum)
History and Physical    Patient Demographics:    Jason Holt. [id number] DOB: 1945-11-25 DOA: 06/07/2019  PCP: [facility]  Patient coming from: Home  I have personally briefly reviewed patient's old medical records in [place]  Chief Complaint: Altered mental status   Assessment & Plan:     Assessment/Plan Active Problems:   GERD (gastroesophageal reflux disease)   AMS (altered mental status)   Depression   Essential hypertension     Principal Problem: Altered mental status, concern for benzo withdrawal Patient brought in by family for increased confusion.  Usually lives alone and is independent.  Neurological exam is essentially benign with no deficits.  Urine drug screen is positive for benzos and opiates.  Labs are otherwise unremarkable and do not show any evidence of renal dysfunction. CT of the head is negative.  Concern for polypharmacy with combination of opiates and benzos possibly causing worsening mental status.  Patient has not been prescribed benzos and has reportedly been obtaining it from his friends.  At the time of my evaluation, the patient appears anxious, tremulous, mildly tachycardic. -We will place under observation -Neurochecks every 4 hours -Gentle IV fluid resuscitation -We will give Valium for suspected benzo withdrawal -Does not seem to have any organic etiology for presentation.  Consider doing additional imaging including MRI and getting a formal neurology consult if he does not have any improvement   Other Active Problems: Hypertension: -Continue lisinopril  GERD: -Continue pantoprazole    DVT prophylaxis: Lovenox Code Status:  Full code Family Communication: N/A  Disposition Plan: Placed in observation for evaluation of altered mental status, will plan for neurology consult Consults called: N/A Admission status: Observation stay    HPI:     HPI: Jason Holt. is a 74 y.o. male with  medical history significant of hypertension, depression, GERD who was brought in by the family due to complaints of altered mental status.  Patient reportedly lives alone at home and was noted to be significantly altered with increased confusion today when family went in to check on him.  Baseline is unclear but as per family report to ED physician, he is not confused at baseline.  No known history of dementia.  As per family there has been some concern for overdose with Xanax or opiates.  No obvious suicidal or homicidal ideation was reported by family.  At the time of my interview, the patient states he actually lives with 2 of his daughters.  This information is conflicting from the one obtained by the ED physician and details are unclear however patient does state that he is usually independent.  Patient initially admitted to using Valium of pain from his friends but then subsequently denied it.  He denies any alcohol use, cocaine and marijuana use.  No known history of smoking.  Patient does state that he feels discomfort as he is unable to pee. No recent illnesses, no fever, chills, nausea, vomiting, dizziness, lightheadedness, seizures, syncope. ED Course:  Vital Signs reviewed on presentation, significant for temperature 99.3, heart rate 88, blood pressure 111/47, saturation 95% on room air. Labs reviewed, significant for venous blood gas shows a pH of 7.37, PCO2 41.  Sodium 142, potassium 4.6, BUN 17, creatinine 0.8, AST 54, ALT 71, total protein 6.3, lactic acid 1.8, WBC count 13.6, hemoglobin 13.0, hematocrit 41, platelet 212, Tylenol level negative, salicylate level negative, influenza PCR-SARS Covid RT-PCR negative, urinalysis is normal, alcohol level negative, urine drug screen is  positive for benzos and opiates. Imaging personally Reviewed, CT of the head shows no acute intracranial abnormalities.  Chest x-ray shows low lung volumes with possible patchy airspace disease in the left base,  atelectasis suspected versus mild pneumonia. EKG personally reviewed, shows sinus tachycardia, right bundle branch block.    Review of systems:    Review of Systems: As per HPI otherwise 10 point review of systems negative.  All other review of systems is negative except the ones noted above in the HPI.    Past Medical and Surgical History:  Reviewed by me  Past Medical History:  Diagnosis Date  . Acid reflux   . Chronic knee pain   . Depression   . Hypertension   . Ruptured lumbar disc     Past Surgical History:  Procedure Laterality Date  . KNEE SURGERY    . LAPAROTOMY  02/18/2011   Procedure: EXPLORATORY LAPAROTOMY;  Surgeon: Jamesetta So;  Location: [facility];  Service: General;  Laterality: N/A;  Gastrorraphy     Social History:  Reviewed by me   reports that he has never smoked. He has never used smokeless tobacco. He reports that he does not drink alcohol or use drugs.  Allergies:    Allergies  Allergen Reactions  . Eggs Or Egg-Derived Products Hives  . Influenza Virus Vacc Split Pf Hives  . Dye Fdc Red [Red Dye]     Family History :   No family history on file. Family history reviewed, noted as above, not pertinent to current presentation.   Home Medications:    Prior to Admission medications   Medication Sig Start Date End Date Taking? Authorizing Provider  docusate sodium (COLACE) 100 MG capsule Take 1 capsule (100 mg total) by mouth every 12 (twelve) hours. Patient taking differently: Take 100 mg by mouth 2 (two) times daily as needed for mild constipation or moderate constipation.  08/30/16  Yes Mesner, [name], MD  gabapentin (NEURONTIN) 300 MG capsule Take 300 mg by mouth 3 (three) times daily.   Yes [provider]  lisinopril (PRINIVIL,ZESTRIL) 5 MG tablet Take 5 mg daily by mouth.   Yes [provider]  pantoprazole (PROTONIX) 40 MG tablet Take 40 mg by mouth daily.   Yes [provider]  HYDROcodone-acetaminophen  (NORCO/VICODIN) 5-325 MG tablet Take 1 tablet by mouth every 4 (four) hours as needed. Patient not taking: Reported on [id number]   Delora Fuel, MD    Physical Exam:    Physical Exam: Vitals:   06/08/19 0130 06/08/19 0200 06/08/19 0230 06/08/19 0300  BP: (!) 117/49 (!) 114/51 (!) 126/47 (!) 111/47  Pulse: 88 87 86 88  Resp: 18 15 15 15   Temp:      TempSrc:      SpO2: 95% 95% 95% 95%  Weight:      Height:        Constitutional: NAD, calm, comfortable Vitals:   06/08/19 0130 06/08/19 0200 06/08/19 0230 06/08/19 0300  BP: (!) 117/49 (!) 114/51 (!) 126/47 (!) 111/47  Pulse: 88 87 86 88  Resp: 18 15 15 15   Temp:      TempSrc:      SpO2: 95% 95% 95% 95%  Weight:      Height:       Eyes: PERRL, lids and conjunctivae normal ENMT: Mucous membranes are moist. Posterior pharynx clear of any exudate or lesions.Normal dentition.  Neck: normal, supple, no masses, no thyromegaly Respiratory: clear to auscultation bilaterally, no wheezing,  no crackles. Normal respiratory effort. No accessory muscle use.  Cardiovascular: Regular rate and rhythm, no murmurs / rubs / gallops. No extremity edema. 2+ pedal pulses. No carotid bruits.  Abdomen: no tenderness, large midline surgical scar with reducible ventral hernia noted. No hepatosplenomegaly. Bowel sounds positive.  Musculoskeletal: no clubbing / cyanosis. No joint deformity upper and lower extremities. Good ROM, no contractures. Normal muscle tone.  Skin: no rashes, lesions, ulcers. No induration Neurologic: CN 2-12 grossly intact. Sensation intact, Strength 5/5 in all 4.  Bilateral tremors noted.  Patient does appear a little anxious.  No obvious focal deficits. Psychiatric: Patient is alert, is able to tell me his name, able to tell me that he is in a [facility] facility, knows the year and the month.  Also knows the president.  Overall judgment seems mildly impaired.   Decubitus Ulcers: Not present on admission Catheters and  tubes: None  Data Review:    Labs on Admission: I have personally reviewed following labs and imaging studies  CBC: Recent Labs  Lab 06/07/19 2116  WBC 13.6*  HGB 13.0  HCT 41.3  MCV 87.9  PLT 99991111   Basic Metabolic Panel: Recent Labs  Lab 06/07/19 2116  NA 142  K 4.6  CL 111  CO2 24  GLUCOSE 132*  BUN 17  CREATININE 0.82  CALCIUM 8.4*   GFR: Estimated Creatinine Clearance: 67.7 mL/min (by C-G formula based on SCr of 0.82 mg/dL). Liver Function Tests: Recent Labs  Lab 06/07/19 2116  AST 54*  ALT 71*  ALKPHOS 100  BILITOT 0.7  PROT 6.3*  ALBUMIN 3.6   No results for input(s): LIPASE, AMYLASE in the last 168 hours. Recent Labs  Lab 06/07/19 2116  AMMONIA 24   Coagulation Profile: No results for input(s): INR, PROTIME in the last 168 hours. Cardiac Enzymes: No results for input(s): CKTOTAL, CKMB, CKMBINDEX, TROPONINI in the last 168 hours. BNP (last 3 results) No results for input(s): PROBNP in the last 8760 hours. HbA1C: No results for input(s): HGBA1C in the last 72 hours. CBG: No results for input(s): GLUCAP in the last 168 hours. Lipid Profile: No results for input(s): CHOL, HDL, LDLCALC, TRIG, CHOLHDL, LDLDIRECT in the last 72 hours. Thyroid Function Tests: No results for input(s): TSH, T4TOTAL, FREET4, T3FREE, THYROIDAB in the last 72 hours. Anemia Panel: No results for input(s): VITAMINB12, FOLATE, FERRITIN, TIBC, IRON, RETICCTPCT in the last 72 hours. Urine analysis:    Component Value Date/Time   COLORURINE YELLOW 06/07/2019 2022   APPEARANCEUR HAZY (A) 06/07/2019 2022   LABSPEC 1.027 06/07/2019 2022   PHURINE 5.0 06/07/2019 2022   GLUCOSEU NEGATIVE 06/07/2019 2022   HGBUR NEGATIVE 06/07/2019 2022   BILIRUBINUR NEGATIVE 06/07/2019 2022   KETONESUR NEGATIVE 06/07/2019 2022   PROTEINUR NEGATIVE 06/07/2019 2022   UROBILINOGEN 1.0 03/05/2011 1822   NITRITE NEGATIVE 06/07/2019 2022   LEUKOCYTESUR NEGATIVE 06/07/2019 2022     Imaging  Results:      Radiological Exams on Admission: CT HEAD WO CONTRAST  Result Date: 06/07/2019 CLINICAL DATA:  Altered mental status EXAM: CT HEAD WITHOUT CONTRAST TECHNIQUE: Contiguous axial images were obtained from the base of the skull through the vertex without intravenous contrast. COMPARISON:  CT 02/25/2011 FINDINGS: Brain: No acute territorial infarction, hemorrhage or intracranial. Mild atrophy. Stable ventricle size. Vascular: No hyperdense vessel.  Carotid vascular calcification Skull: Normal. Negative for fracture or focal lesion. Sinuses/Orbits: No acute finding. Other: None IMPRESSION: 1. No CT evidence for acute intracranial abnormality. 2.  Mild atrophy Electronically Signed   By: Donavan Foil M.D.   On: 06/07/2019 21:03   DG Chest Port 1 View  Result Date: 06/07/2019 CLINICAL DATA:  Altered mental status EXAM: PORTABLE CHEST 1 VIEW COMPARISON:  03/11/2011 FINDINGS: Low lung volumes. Cardiomegaly. Patchy airspace disease at the left base. No pneumothorax. IMPRESSION: 1. Low lung volumes with cardiomegaly 2. Patchy airspace disease at the left base, atelectasis versus mild pneumonia. Electronically Signed   By: Donavan Foil M.D.   On: 06/07/2019 21:08      [facility] Ginette Otto MD [facility]  If 7PM-7AM, please contact night-coverage   06/08/2019, 4:32 AM

## 2019-06-12 LAB — CULTURE, BLOOD (SINGLE)
Culture: NO GROWTH
Special Requests: ADEQUATE

## 2021-07-07 ENCOUNTER — Other Ambulatory Visit: Payer: Self-pay

## 2021-07-07 ENCOUNTER — Emergency Department (HOSPITAL_COMMUNITY): Payer: No Typology Code available for payment source

## 2021-07-07 ENCOUNTER — Encounter (HOSPITAL_COMMUNITY): Payer: Self-pay | Admitting: Emergency Medicine

## 2021-07-07 ENCOUNTER — Other Ambulatory Visit (HOSPITAL_COMMUNITY): Payer: Self-pay | Admitting: *Deleted

## 2021-07-07 ENCOUNTER — Inpatient Hospital Stay (HOSPITAL_COMMUNITY)
Admission: EM | Admit: 2021-07-07 | Discharge: 2021-08-08 | DRG: 871 | Disposition: E | Payer: No Typology Code available for payment source | Attending: Pulmonary Disease | Admitting: Pulmonary Disease

## 2021-07-07 ENCOUNTER — Other Ambulatory Visit (HOSPITAL_COMMUNITY): Payer: No Typology Code available for payment source

## 2021-07-07 DIAGNOSIS — I2602 Saddle embolus of pulmonary artery with acute cor pulmonale: Secondary | ICD-10-CM | POA: Diagnosis not present

## 2021-07-07 DIAGNOSIS — J95811 Postprocedural pneumothorax: Secondary | ICD-10-CM | POA: Diagnosis not present

## 2021-07-07 DIAGNOSIS — Y95 Nosocomial condition: Secondary | ICD-10-CM | POA: Diagnosis present

## 2021-07-07 DIAGNOSIS — D638 Anemia in other chronic diseases classified elsewhere: Secondary | ICD-10-CM | POA: Diagnosis present

## 2021-07-07 DIAGNOSIS — R319 Hematuria, unspecified: Secondary | ICD-10-CM

## 2021-07-07 DIAGNOSIS — J810 Acute pulmonary edema: Secondary | ICD-10-CM

## 2021-07-07 DIAGNOSIS — Z01818 Encounter for other preprocedural examination: Secondary | ICD-10-CM

## 2021-07-07 DIAGNOSIS — J90 Pleural effusion, not elsewhere classified: Secondary | ICD-10-CM | POA: Diagnosis not present

## 2021-07-07 DIAGNOSIS — F1120 Opioid dependence, uncomplicated: Secondary | ICD-10-CM

## 2021-07-07 DIAGNOSIS — Z7189 Other specified counseling: Secondary | ICD-10-CM

## 2021-07-07 DIAGNOSIS — Z515 Encounter for palliative care: Secondary | ICD-10-CM

## 2021-07-07 DIAGNOSIS — D6489 Other specified anemias: Secondary | ICD-10-CM | POA: Diagnosis present

## 2021-07-07 DIAGNOSIS — Z887 Allergy status to serum and vaccine status: Secondary | ICD-10-CM

## 2021-07-07 DIAGNOSIS — R251 Tremor, unspecified: Secondary | ICD-10-CM

## 2021-07-07 DIAGNOSIS — Z452 Encounter for adjustment and management of vascular access device: Secondary | ICD-10-CM

## 2021-07-07 DIAGNOSIS — I248 Other forms of acute ischemic heart disease: Secondary | ICD-10-CM | POA: Diagnosis not present

## 2021-07-07 DIAGNOSIS — E870 Hyperosmolality and hypernatremia: Secondary | ICD-10-CM | POA: Diagnosis present

## 2021-07-07 DIAGNOSIS — Z6836 Body mass index (BMI) 36.0-36.9, adult: Secondary | ICD-10-CM | POA: Diagnosis not present

## 2021-07-07 DIAGNOSIS — N189 Chronic kidney disease, unspecified: Secondary | ICD-10-CM

## 2021-07-07 DIAGNOSIS — J9811 Atelectasis: Secondary | ICD-10-CM | POA: Diagnosis not present

## 2021-07-07 DIAGNOSIS — K7460 Unspecified cirrhosis of liver: Secondary | ICD-10-CM | POA: Diagnosis present

## 2021-07-07 DIAGNOSIS — D649 Anemia, unspecified: Secondary | ICD-10-CM

## 2021-07-07 DIAGNOSIS — A419 Sepsis, unspecified organism: Secondary | ICD-10-CM

## 2021-07-07 DIAGNOSIS — Z20822 Contact with and (suspected) exposure to covid-19: Secondary | ICD-10-CM | POA: Diagnosis present

## 2021-07-07 DIAGNOSIS — F32A Depression, unspecified: Secondary | ICD-10-CM | POA: Diagnosis present

## 2021-07-07 DIAGNOSIS — I952 Hypotension due to drugs: Secondary | ICD-10-CM

## 2021-07-07 DIAGNOSIS — L89322 Pressure ulcer of left buttock, stage 2: Secondary | ICD-10-CM | POA: Diagnosis present

## 2021-07-07 DIAGNOSIS — R338 Other retention of urine: Secondary | ICD-10-CM | POA: Diagnosis not present

## 2021-07-07 DIAGNOSIS — D62 Acute posthemorrhagic anemia: Secondary | ICD-10-CM | POA: Diagnosis present

## 2021-07-07 DIAGNOSIS — J918 Pleural effusion in other conditions classified elsewhere: Secondary | ICD-10-CM | POA: Diagnosis present

## 2021-07-07 DIAGNOSIS — J939 Pneumothorax, unspecified: Secondary | ICD-10-CM

## 2021-07-07 DIAGNOSIS — J189 Pneumonia, unspecified organism: Secondary | ICD-10-CM

## 2021-07-07 DIAGNOSIS — Z66 Do not resuscitate: Secondary | ICD-10-CM | POA: Diagnosis present

## 2021-07-07 DIAGNOSIS — J9601 Acute respiratory failure with hypoxia: Secondary | ICD-10-CM | POA: Diagnosis not present

## 2021-07-07 DIAGNOSIS — J690 Pneumonitis due to inhalation of food and vomit: Secondary | ICD-10-CM | POA: Diagnosis present

## 2021-07-07 DIAGNOSIS — E876 Hypokalemia: Secondary | ICD-10-CM | POA: Diagnosis present

## 2021-07-07 DIAGNOSIS — L8990 Pressure ulcer of unspecified site, unspecified stage: Secondary | ICD-10-CM | POA: Diagnosis present

## 2021-07-07 DIAGNOSIS — R7989 Other specified abnormal findings of blood chemistry: Secondary | ICD-10-CM | POA: Diagnosis present

## 2021-07-07 DIAGNOSIS — I130 Hypertensive heart and chronic kidney disease with heart failure and stage 1 through stage 4 chronic kidney disease, or unspecified chronic kidney disease: Secondary | ICD-10-CM | POA: Diagnosis present

## 2021-07-07 DIAGNOSIS — D72829 Elevated white blood cell count, unspecified: Secondary | ICD-10-CM | POA: Diagnosis not present

## 2021-07-07 DIAGNOSIS — E669 Obesity, unspecified: Secondary | ICD-10-CM | POA: Diagnosis present

## 2021-07-07 DIAGNOSIS — Z4659 Encounter for fitting and adjustment of other gastrointestinal appliance and device: Secondary | ICD-10-CM

## 2021-07-07 DIAGNOSIS — K219 Gastro-esophageal reflux disease without esophagitis: Secondary | ICD-10-CM | POA: Diagnosis not present

## 2021-07-07 DIAGNOSIS — Z79899 Other long term (current) drug therapy: Secondary | ICD-10-CM

## 2021-07-07 DIAGNOSIS — J811 Chronic pulmonary edema: Secondary | ICD-10-CM

## 2021-07-07 DIAGNOSIS — E871 Hypo-osmolality and hyponatremia: Secondary | ICD-10-CM | POA: Diagnosis not present

## 2021-07-07 DIAGNOSIS — K439 Ventral hernia without obstruction or gangrene: Secondary | ICD-10-CM | POA: Diagnosis present

## 2021-07-07 DIAGNOSIS — R6520 Severe sepsis without septic shock: Secondary | ICD-10-CM

## 2021-07-07 DIAGNOSIS — I10 Essential (primary) hypertension: Secondary | ICD-10-CM | POA: Diagnosis present

## 2021-07-07 DIAGNOSIS — R5381 Other malaise: Secondary | ICD-10-CM | POA: Diagnosis present

## 2021-07-07 DIAGNOSIS — N170 Acute kidney failure with tubular necrosis: Secondary | ICD-10-CM | POA: Diagnosis present

## 2021-07-07 DIAGNOSIS — F419 Anxiety disorder, unspecified: Secondary | ICD-10-CM | POA: Diagnosis present

## 2021-07-07 DIAGNOSIS — G629 Polyneuropathy, unspecified: Secondary | ICD-10-CM | POA: Diagnosis present

## 2021-07-07 DIAGNOSIS — G9341 Metabolic encephalopathy: Secondary | ICD-10-CM

## 2021-07-07 DIAGNOSIS — J9690 Respiratory failure, unspecified, unspecified whether with hypoxia or hypercapnia: Secondary | ICD-10-CM

## 2021-07-07 DIAGNOSIS — I2699 Other pulmonary embolism without acute cor pulmonale: Secondary | ICD-10-CM

## 2021-07-07 DIAGNOSIS — I2692 Saddle embolus of pulmonary artery without acute cor pulmonale: Secondary | ICD-10-CM | POA: Diagnosis present

## 2021-07-07 DIAGNOSIS — K8689 Other specified diseases of pancreas: Secondary | ICD-10-CM | POA: Diagnosis present

## 2021-07-07 DIAGNOSIS — C641 Malignant neoplasm of right kidney, except renal pelvis: Secondary | ICD-10-CM | POA: Diagnosis present

## 2021-07-07 DIAGNOSIS — Z9889 Other specified postprocedural states: Secondary | ICD-10-CM

## 2021-07-07 DIAGNOSIS — I5033 Acute on chronic diastolic (congestive) heart failure: Secondary | ICD-10-CM | POA: Diagnosis not present

## 2021-07-07 DIAGNOSIS — R778 Other specified abnormalities of plasma proteins: Secondary | ICD-10-CM | POA: Diagnosis present

## 2021-07-07 DIAGNOSIS — N179 Acute kidney failure, unspecified: Secondary | ICD-10-CM | POA: Diagnosis not present

## 2021-07-07 DIAGNOSIS — L304 Erythema intertrigo: Secondary | ICD-10-CM | POA: Diagnosis present

## 2021-07-07 DIAGNOSIS — E872 Acidosis, unspecified: Secondary | ICD-10-CM | POA: Diagnosis present

## 2021-07-07 DIAGNOSIS — T508X5A Adverse effect of diagnostic agents, initial encounter: Secondary | ICD-10-CM | POA: Diagnosis present

## 2021-07-07 DIAGNOSIS — G8929 Other chronic pain: Secondary | ICD-10-CM

## 2021-07-07 DIAGNOSIS — T426X5A Adverse effect of other antiepileptic and sedative-hypnotic drugs, initial encounter: Secondary | ICD-10-CM | POA: Diagnosis not present

## 2021-07-07 DIAGNOSIS — N329 Bladder disorder, unspecified: Secondary | ICD-10-CM | POA: Diagnosis present

## 2021-07-07 DIAGNOSIS — N5089 Other specified disorders of the male genital organs: Secondary | ICD-10-CM | POA: Diagnosis not present

## 2021-07-07 DIAGNOSIS — R310 Gross hematuria: Secondary | ICD-10-CM | POA: Diagnosis not present

## 2021-07-07 DIAGNOSIS — Z91012 Allergy to eggs: Secondary | ICD-10-CM

## 2021-07-07 DIAGNOSIS — L89312 Pressure ulcer of right buttock, stage 2: Secondary | ICD-10-CM | POA: Diagnosis present

## 2021-07-07 DIAGNOSIS — N4889 Other specified disorders of penis: Secondary | ICD-10-CM | POA: Diagnosis present

## 2021-07-07 DIAGNOSIS — E860 Dehydration: Secondary | ICD-10-CM | POA: Diagnosis present

## 2021-07-07 HISTORY — DX: Other specified abnormal findings of blood chemistry: R79.89

## 2021-07-07 HISTORY — DX: Other specified abnormalities of plasma proteins: R77.8

## 2021-07-07 LAB — COMPREHENSIVE METABOLIC PANEL
ALT: 15 U/L (ref 0–44)
AST: 24 U/L (ref 15–41)
Albumin: 3.3 g/dL — ABNORMAL LOW (ref 3.5–5.0)
Alkaline Phosphatase: 108 U/L (ref 38–126)
Anion gap: 16 — ABNORMAL HIGH (ref 5–15)
BUN: 43 mg/dL — ABNORMAL HIGH (ref 8–23)
CO2: 15 mmol/L — ABNORMAL LOW (ref 22–32)
Calcium: 8.1 mg/dL — ABNORMAL LOW (ref 8.9–10.3)
Chloride: 112 mmol/L — ABNORMAL HIGH (ref 98–111)
Creatinine, Ser: 2.18 mg/dL — ABNORMAL HIGH (ref 0.61–1.24)
GFR, Estimated: 30 mL/min — ABNORMAL LOW (ref >60)
Glucose, Bld: 183 mg/dL — ABNORMAL HIGH (ref 70–99)
Potassium: 3.4 mmol/L — ABNORMAL LOW (ref 3.5–5.1)
Sodium: 143 mmol/L (ref 135–145)
Total Bilirubin: 0.9 mg/dL (ref 0.3–1.2)
Total Protein: 7.3 g/dL (ref 6.5–8.1)

## 2021-07-07 LAB — CBC WITH DIFFERENTIAL/PLATELET
Abs Immature Granulocytes: 0.24 10*3/uL — ABNORMAL HIGH (ref 0.00–0.07)
Basophils Absolute: 0.2 10*3/uL — ABNORMAL HIGH (ref 0.0–0.1)
Basophils Relative: 1 %
Eosinophils Absolute: 0.1 10*3/uL (ref 0.0–0.5)
Eosinophils Relative: 1 %
HCT: 37.4 % — ABNORMAL LOW (ref 39.0–52.0)
Hemoglobin: 11.9 g/dL — ABNORMAL LOW (ref 13.0–17.0)
Immature Granulocytes: 2 %
Lymphocytes Relative: 10 %
Lymphs Abs: 1.5 10*3/uL (ref 0.7–4.0)
MCH: 27.8 pg (ref 26.0–34.0)
MCHC: 31.8 g/dL (ref 30.0–36.0)
MCV: 87.4 fL (ref 80.0–100.0)
Monocytes Absolute: 0.9 10*3/uL (ref 0.1–1.0)
Monocytes Relative: 7 %
Neutro Abs: 11.2 10*3/uL — ABNORMAL HIGH (ref 1.7–7.7)
Neutrophils Relative %: 79 %
Platelets: 234 10*3/uL (ref 150–400)
RBC: 4.28 MIL/uL (ref 4.22–5.81)
RDW: 15 % (ref 11.5–15.5)
WBC: 14.1 10*3/uL — ABNORMAL HIGH (ref 4.0–10.5)
nRBC: 0 % (ref 0.0–0.2)

## 2021-07-07 LAB — BLOOD GAS, ARTERIAL
Acid-base deficit: 11.5 mmol/L — ABNORMAL HIGH (ref 0.0–2.0)
Bicarbonate: 14.4 mmol/L — ABNORMAL LOW (ref 20.0–28.0)
Drawn by: 27733
FIO2: 100 %
O2 Saturation: 88.7 %
Patient temperature: 36.1
pCO2 arterial: 31 mmHg — ABNORMAL LOW (ref 32–48)
pH, Arterial: 7.27 — ABNORMAL LOW (ref 7.35–7.45)
pO2, Arterial: 59 mmHg — ABNORMAL LOW (ref 83–108)

## 2021-07-07 LAB — RESP PANEL BY RT-PCR (FLU A&B, COVID) ARPGX2
Influenza A by PCR: NEGATIVE
Influenza B by PCR: NEGATIVE
SARS Coronavirus 2 by RT PCR: NEGATIVE

## 2021-07-07 LAB — CK: Total CK: 62 U/L (ref 49–397)

## 2021-07-07 LAB — GLUCOSE, CAPILLARY
Glucose-Capillary: 217 mg/dL — ABNORMAL HIGH (ref 70–99)
Glucose-Capillary: 234 mg/dL — ABNORMAL HIGH (ref 70–99)
Glucose-Capillary: 224 mg/dL — ABNORMAL HIGH (ref 70–99)
Glucose-Capillary: 169 mg/dL — ABNORMAL HIGH (ref 70–99)

## 2021-07-07 LAB — TROPONIN I (HIGH SENSITIVITY)
Troponin I (High Sensitivity): 150 ng/L (ref <18)
Troponin I (High Sensitivity): 129 ng/L (ref <18)

## 2021-07-07 LAB — APTT: aPTT: 32 seconds (ref 24–36)

## 2021-07-07 LAB — BRAIN NATRIURETIC PEPTIDE: B Natriuretic Peptide: 1210 pg/mL — ABNORMAL HIGH (ref 0.0–100.0)

## 2021-07-07 LAB — MRSA NEXT GEN BY PCR, NASAL: MRSA by PCR Next Gen: NOT DETECTED

## 2021-07-07 LAB — LACTIC ACID, PLASMA
Lactic Acid, Venous: 3.2 mmol/L (ref 0.5–1.9)
Lactic Acid, Venous: 3.6 mmol/L (ref 0.5–1.9)

## 2021-07-07 LAB — PROTIME-INR
INR: 1.1 (ref 0.8–1.2)
Prothrombin Time: 14.5 seconds (ref 11.4–15.2)

## 2021-07-07 LAB — PROCALCITONIN: Procalcitonin: <0.1 ng/mL

## 2021-07-07 MED ORDER — CHLORHEXIDINE GLUCONATE CLOTH 2 % EX PADS
6.0000 | MEDICATED_PAD | Freq: Every day | CUTANEOUS | Status: DC
  Administered 2021-07-08: 6 via TOPICAL

## 2021-07-07 MED ORDER — PANTOPRAZOLE SODIUM 40 MG PO TBEC
40.0000 mg | DELAYED_RELEASE_TABLET | Freq: Every day | ORAL | Status: DC
  Administered 2021-07-08 – 2021-07-12 (×5): 40 mg via ORAL
  Filled 2021-07-07 (×5): qty 1

## 2021-07-07 MED ORDER — HEPARIN SODIUM (PORCINE) 5000 UNIT/ML IJ SOLN
5000.0000 [IU] | Freq: Three times a day (TID) | INTRAMUSCULAR | Status: DC
  Administered 2021-07-07 – 2021-07-10 (×9): 5000 [IU] via SUBCUTANEOUS
  Filled 2021-07-07 (×9): qty 1

## 2021-07-07 MED ORDER — LORAZEPAM 2 MG/ML IJ SOLN
0.5000 mg | Freq: Once | INTRAMUSCULAR | Status: AC | PRN
  Administered 2021-07-07: 0.5 mg via INTRAVENOUS
  Filled 2021-07-07: qty 1

## 2021-07-07 MED ORDER — ALBUTEROL SULFATE (2.5 MG/3ML) 0.083% IN NEBU
10.0000 mg | INHALATION_SOLUTION | Freq: Once | RESPIRATORY_TRACT | Status: AC
  Administered 2021-07-07: 10 mg via RESPIRATORY_TRACT
  Filled 2021-07-07: qty 12

## 2021-07-07 MED ORDER — SODIUM CHLORIDE 0.9 % IV SOLN
2.0000 g | INTRAVENOUS | Status: AC
  Administered 2021-07-07 – 2021-07-11 (×5): 2 g via INTRAVENOUS
  Filled 2021-07-07 (×5): qty 20

## 2021-07-07 MED ORDER — SODIUM CHLORIDE 0.9 % IV SOLN
500.0000 mg | INTRAVENOUS | Status: AC
  Administered 2021-07-07 – 2021-07-11 (×5): 500 mg via INTRAVENOUS
  Filled 2021-07-07 (×5): qty 5

## 2021-07-07 MED ORDER — ACETAMINOPHEN 650 MG RE SUPP: 650.0000 mg | Freq: Four times a day (QID) | RECTAL | Status: DC | PRN

## 2021-07-07 MED ORDER — LACTATED RINGERS IV SOLN: INTRAVENOUS | Status: DC

## 2021-07-07 MED ORDER — LACTATED RINGERS IV BOLUS
1500.0000 mL | Freq: Once | INTRAVENOUS | Status: AC
  Administered 2021-07-07: 1500 mL via INTRAVENOUS

## 2021-07-07 MED ORDER — IPRATROPIUM-ALBUTEROL 0.5-2.5 (3) MG/3ML IN SOLN: 3.0000 mL | Freq: Four times a day (QID) | RESPIRATORY_TRACT | Status: DC | PRN

## 2021-07-07 MED ORDER — POTASSIUM CHLORIDE CRYS ER 20 MEQ PO TBCR: 40.0000 meq | EXTENDED_RELEASE_TABLET | Freq: Once | ORAL | Status: DC

## 2021-07-07 MED ORDER — ONDANSETRON HCL 4 MG PO TABS: 4.0000 mg | ORAL_TABLET | Freq: Four times a day (QID) | ORAL | Status: DC | PRN

## 2021-07-07 MED ORDER — ACETAMINOPHEN 325 MG PO TABS
650.0000 mg | ORAL_TABLET | Freq: Four times a day (QID) | ORAL | Status: DC | PRN
  Administered 2021-07-07 – 2021-07-16 (×12): 650 mg via ORAL
  Filled 2021-07-07 (×12): qty 2

## 2021-07-07 MED ORDER — IPRATROPIUM BROMIDE 0.02 % IN SOLN
1.0000 mg | Freq: Once | RESPIRATORY_TRACT | Status: AC
  Administered 2021-07-07: 1 mg via RESPIRATORY_TRACT
  Filled 2021-07-07: qty 5

## 2021-07-07 MED ORDER — ONDANSETRON HCL 4 MG/2ML IJ SOLN: 4.0000 mg | Freq: Four times a day (QID) | INTRAMUSCULAR | Status: DC | PRN

## 2021-07-07 MED ORDER — SODIUM CHLORIDE 0.9 % IV BOLUS
1000.0000 mL | Freq: Once | INTRAVENOUS | Status: AC
  Administered 2021-07-07: 1000 mL via INTRAVENOUS

## 2021-07-07 NOTE — Consult Note (Signed)
[facility]   Patient name: Jason Holt. Admit date: 06/24/2021  DOB: [birth year] LOS: 0  MRN: [id number] Consult date: 06/12/2021  Referring provider: Dr. Manuella Ghazi, [facility]: short of breath    History:  76 yo male presented to [facility] with dyspnea for 3 days.  EMS called on 2/27 and he had SpO2 in the 80's on room air.  He declined option to come to ER then.  Symptoms got worse and he came to ER.  SpO2 in 70's on room air.  Found to have Rt sided pneumonia.  Started on IV fluids and antibiotics for sepsis, and Bipap with oxygen for respiratory failure.  PCCM consulted to assist with management in ICU.  Past medical history:  HTN, Depression, GERD  Significant events:  2/28 Admit  Studies:  Echo 2/28 >>   Micro:  COVID/Flu 2/28 >> negative Blood 2/28 >>  Legionella 2/28 >> Pneumococcal 2/28 >>   Lines:     Antibiotics:  Rocephin 2/28 >> Zithromax 2/28 >>   Consults:      Interim history:  Breathing better with Bipap.  Not having chest pain or abdominal pain.  Wants something to drink.  Vital signs:  BP 90/79    Pulse 95    Temp 97.6 F (36.4 C)    Resp (!) 23    Ht 5\' 5"  (1.651 m)    Wt 94.2 kg    SpO2 100%    BMI 34.56 kg/m   Intake/output:  No intake/output data recorded.   Physical exam:   General - alert Eyes - pupils reactive ENT - Bipap mask on Cardiac - regular rate/rhythm, no murmur Chest - b/l rhonchi Abdomen - soft, non tender, + bowel sounds Extremities - no cyanosis, clubbing, or edema Skin - no rashes Neuro - normal strength, moves extremities, follows commands Psych - normal mood and behavior   Best practice:   DVT - SQ heparin SUP - Protonix Nutrition - NPO   Assessment/plan:   Acute hypoxic respiratory failure from community acquired bacterial pneumonia. - goal SpO2 > 92% - continue Bipap as needed - monitor need for intubation in ICU  Severe sepsis from pneumonia. -  continue IV fluids - day 1 of Abx - f/u blood culture, urine legionella and pneumococcal antigens  AKI from ATN in setting of sepsis. Anion gap/non gap metabolic acidosis with lactic acidosis. Hypokalemia. - baseline creatinine 0.82 from 06/07/19 - f/u BMET - monitor urine outpt  Elevated troponin likely from demand ischemia in setting of sepsis. - f/u Echo  Resolved hospital problems:    Goals of care/Family discussions:  Code status: Full  Updated pt's daughters at bedside.  Labs:   CMP Latest Ref Rng & Units 06/15/2021 06/07/2019 10/03/2018  Glucose 70 - 99 mg/dL 183(H) 132(H) 96  BUN 8 - 23 mg/dL 43(H) 17 16  Creatinine 0.61 - 1.24 mg/dL 2.18(H) 0.82 0.63  Sodium 135 - 145 mmol/L 143 142 137  Potassium 3.5 - 5.1 mmol/L 3.4(L) 4.6 3.5  Chloride 98 - 111 mmol/L 112(H) 111 100  CO2 22 - 32 mmol/L 15(L) 24 22  Calcium 8.9 - 10.3 mg/dL 8.1(L) 8.4(L) 8.6(L)  Total Protein 6.5 - 8.1 g/dL 7.3 6.3(L) 6.9  Total Bilirubin 0.3 - 1.2 mg/dL 0.9 0.7 0.9  Alkaline Phos 38 - 126 U/L 108 100 101  AST 15 - 41 U/L 24 54(H) 19  ALT 0 - 44 U/L 15 71(H) 24    CBC Latest  Ref Rng & Units 06/24/2021 06/07/2019 10/03/2018  WBC 4.0 - 10.5 K/uL 14.1(H) 13.6(H) 8.5  Hemoglobin 13.0 - 17.0 g/dL 11.9(L) 13.0 12.7(L)  Hematocrit 39.0 - 52.0 % 37.4(L) 41.3 39.2  Platelets 150 - 400 K/uL 234 212 359    ABG    Component Value Date/Time   PHART 7.27 (L) 07/06/2021 0947   PCO2ART 31 (L) 06/30/2021 0947   PO2ART 59 (L) 06/12/2021 0947   HCO3 14.4 (L) 06/18/2021 0947   TCO2 25.9 03/01/2011 0429   ACIDBASEDEF 11.5 (H) 06/18/2021 0947   O2SAT 88.7 06/12/2021 0947    CBG (last 3)  Recent Labs    06/21/2021 1311  GLUCAP 217*     Past surgical history:  He  has a past surgical history that includes laparotomy (02/18/2011) and Knee surgery.  Social history:  He  reports that he has never smoked. He has never used smokeless tobacco. He reports that he does not drink alcohol and does not use  drugs.   Review of systems:  Reviewed and negative  Family history:  His family history is not on file.    Medications:   No current facility-administered medications on file prior to encounter.   Current Outpatient Medications on File Prior to Encounter  Medication Sig   gabapentin (NEURONTIN) 300 MG capsule Take 1 capsule (300 mg total) by mouth 3 (three) times daily.   lisinopril (PRINIVIL,ZESTRIL) 5 MG tablet Take 5 mg daily by mouth.   pantoprazole (PROTONIX) 40 MG tablet Take 40 mg by mouth daily.   docusate sodium (COLACE) 100 MG capsule Take 1 capsule (100 mg total) by mouth every 12 (twelve) hours. (Patient taking differently: Take 100 mg by mouth 2 (two) times daily as needed for mild constipation or moderate constipation. )     Critical care time: 38 minutes  Chesley Mires, MD [place] Pager - [phone] 06/10/2021, 1:33 PM

## 2021-07-07 NOTE — Assessment & Plan Note (Addendum)
Initially thought secondary to pneumonia but likely related to bilateral PE  He is treated with BiPAP therapy Appreciate PCCM evaluation and recommendations.  I discussed with Dr. Halford Chessman, due to the advanced PE He recommended transfer to [facility] for IR eval regarding intervention for PE.

## 2021-07-07 NOTE — Assessment & Plan Note (Addendum)
Hold home lisinopril given AKI Currently with softer blood pressure readings

## 2021-07-07 NOTE — H&P (Signed)
History and Physical    Patient: Jason Holt. [id number] DOB: Oct 06, 1945 DOA: 06/19/2021 DOS: the patient was seen and examined on 06/21/2021 PCP: [facility]  Patient coming from: Home  Chief Complaint:  Chief Complaint  Patient presents with   Respiratory Distress    HPI: Jason Holt. is a 76 y.o. male with medical history significant of hypertension, depression, and GERD who presented to the ED with worsening shortness of breath over the last 2-3 days.  EMS was called to his home yesterday and he was noted to be saturating in the 80th percentile while on room air, but he declined coming to the ED.  EMS was called again to his home and he was saturating in the 70th percentile this time.  He was found in his chair at home and was sitting in some urine.  According to his daughter at bedside who is his caretaker he has not had much of a cough, fevers, or chills.  No chest pain, nausea, vomiting or sick contacts noted.  He apparently lives at home by himself and does not smoke.  In the ED, chest x-ray noted to have findings of right sided pneumonia as well as leukocytosis.  Due to his significant hypoxemia and tachypnea he was started on BiPAP to which she is now more comfortable.  His lactic acid was 3.2 and is elevating to 3.6 and he has only received 1 L IV fluid and has been started on azithromycin and Rocephin.  EKG was sinus rhythm 93 bpm and troponin elevated at 150.  BNP is 1210.  Creatinine is 2.18 with baseline 0.8-0.9.  Flu and COVID testing negative.  Review of Systems: As mentioned in the history of present illness. All other systems reviewed and are negative. Past Medical History:  Diagnosis Date   Acid reflux    Chronic knee pain    Depression    Hypertension    Ruptured lumbar disc    Past Surgical History:  Procedure Laterality Date   KNEE SURGERY     LAPAROTOMY  02/18/2011   Procedure: EXPLORATORY LAPAROTOMY;  Surgeon: Jamesetta So;  Location: [facility];  Service: General;  Laterality: N/A;  Gastrorraphy   Social History:  reports that he has never smoked. He has never used smokeless tobacco. He reports that he does not drink alcohol and does not use drugs.  Allergies  Allergen Reactions   Eggs Or Egg-Derived Products Hives   Influenza Virus Vacc Split Pf Hives   Dye Fdc Red [Red Dye]     History reviewed. No pertinent family history.  Prior to Admission medications   Medication Sig Start Date End Date Taking? Authorizing Provider  docusate sodium (COLACE) 100 MG capsule Take 1 capsule (100 mg total) by mouth every 12 (twelve) hours. Patient taking differently: Take 100 mg by mouth 2 (two) times daily as needed for mild constipation or moderate constipation.  08/30/16   Mesner, [name], MD  gabapentin (NEURONTIN) 300 MG capsule Take 1 capsule (300 mg total) by mouth 3 (three) times daily. 06/08/19   Mercy Riding, MD  lisinopril (PRINIVIL,ZESTRIL) 5 MG tablet Take 5 mg daily by mouth.    [provider]  pantoprazole (PROTONIX) 40 MG tablet Take 40 mg by mouth daily.    [provider]    Physical Exam: Vitals:   07/02/2021 0930 06/18/2021 1003 07/05/2021 1030 06/22/2021 1100  BP: 122/62 112/67 97/76 108/80  Pulse: 91 (!) 106 92 95  Resp: (!) 30 (!) 21 (!) 22 (!) 23  Temp: (!) 97 F (36.1 C)     TempSrc: Oral     SpO2: (!) 87% 92% 100% 100%  Weight:      Height:       General exam: Appears calm and comfortable  Respiratory system: Diminished at bilateral bases.  Currently on BiPAP FiO2 100% Cardiovascular system: S1 & S2 heard, RRR.  Gastrointestinal system: Abdomen is soft Central nervous system: Alert and awake Extremities: No edema Skin: No significant lesions noted Psychiatry: Flat affect.  Data Reviewed:  There are no new results to review at this time.  Assessment and Plan: * Severe sepsis ([place])- (present on admission) Secondary to right-sided  pneumonia-community-acquired Check procalcitonin Continue Rocephin and azithromycin Monitor cultures Urine Legionella and strep pneumonia COVID and flu swab negative  Elevated troponin Likely related to demand with sepsis physiology Continue to monitor 2D echocardiogram is ordered for further evaluation  AKI (acute kidney injury) ([place]) Baseline creatinine 0.8-0.9 Likely related to sepsis Avoid nephrotoxic agents Monitor strict I's and O's Continue IV fluid hydration  Acute respiratory failure with hypoxia ([facility]) Secondary to pneumonia/sepsis Not noted to have any prior structural lung issues Continue on BiPAP for now and wean as tolerated Appreciate PCCM evaluation  Essential hypertension- (present on admission) Hold home lisinopril given AKI Currently with softer blood pressure readings with sepsis physiology, monitor  GERD (gastroesophageal reflux disease)- (present on admission) Continue on IV PPI until off BiPAP   Advance Care Planning: Full code  Consults: PCCM  Family Communication: Daughter at bedside  Severity of Illness: The appropriate patient status for this patient is INPATIENT. Inpatient status is judged to be reasonable and necessary in order to provide the required intensity of service to ensure the patient's safety. The patient's presenting symptoms, physical exam findings, and initial radiographic and laboratory data in the context of their chronic comorbidities is felt to place them at high risk for further clinical deterioration. Furthermore, it is not anticipated that the patient will be medically stable for discharge from the hospital within 2 midnights of admission.   * I certify that at the point of admission it is my clinical judgment that the patient will require inpatient hospital care spanning beyond 2 midnights from the point of admission due to high intensity of service, high risk for further deterioration and high frequency of surveillance  required.*  Author: Rodena Goldmann, DO 07/04/2021 11:42 AM  For on call review [email or web address].

## 2021-07-07 NOTE — Progress Notes (Signed)
Patient taken off of BIPAP and placed on 15L HFNC. Patient tolerating well at this time. Will monitor as needed.

## 2021-07-07 NOTE — ED Notes (Signed)
Pt had been incontinent of urine and stool prior to arrival, peri care provided, and pt made aware of need to collect urine specimen

## 2021-07-07 NOTE — Assessment & Plan Note (Addendum)
Likely related to demand with sepsis physiology Continue to monitor 2D echocardiogram 07/08/21: 1. Left ventricular ejection fraction, by estimation, is 60 to 65%. The left ventricle has normal function. The left ventricle has no regional wall motion abnormalities. There is mild concentric left ventricular hypertrophy. Left ventricular diastolic parameters are consistent with Grade II diastolic dysfunction (pseudonormalization). There is the interventricular septum is flattened in diastole ('D' shaped left ventricle), consistent with right ventricular volume overload.  2. Right ventricular systolic function is mildly reduced. The right ventricular size is dilated. There is moderately elevated pulmonary artery systolic pressure. RV mid wall not well visualized.  3. The mitral valve is grossly normal. No evidence of mitral valve regurgitation. No evidence of mitral stenosis.  4. The aortic valve is tricuspid. Aortic valve regurgitation is not visualized. No aortic stenosis is present.

## 2021-07-07 NOTE — ED Triage Notes (Signed)
Pt c/o respiratory distress since yesterday. Refused tx to hospital yesterday. Malodorous urine.

## 2021-07-07 NOTE — ED Notes (Signed)
Pt increased work of breathing and shortness of breath with trial off bipap at 6L, sat 76%, returned to NRB 100%.  Sat 90% with NRB

## 2021-07-07 NOTE — ED Notes (Signed)
Respiratory Therapist at bedside to trial pt off bipap, applied oxygen via [place] at 6L, will bring bipap to ICU-01 for when pt gets to inpatient room

## 2021-07-07 NOTE — ED Notes (Signed)
Report provided to [facility], transported to ICU rm 01 with respiratory therapist on bipap.

## 2021-07-07 NOTE — Progress Notes (Signed)
Pt back on BIPAP same settings

## 2021-07-07 NOTE — Assessment & Plan Note (Addendum)
Baseline creatinine 0.8-0.9 Avoid nephrotoxic agents Monitor strict I's and O's IV fluid held due to findings of pulmonary edema

## 2021-07-07 NOTE — Assessment & Plan Note (Addendum)
Continue on IV PPI

## 2021-07-07 NOTE — ED Notes (Signed)
RT returned to bipap, with original settings

## 2021-07-07 NOTE — ED Notes (Signed)
Work of breathing slowly improving with bipap, sat 100%.  Bed assigned in ICU, attempt to call report, receiving nurse not available at this time, will call back.

## 2021-07-07 NOTE — Assessment & Plan Note (Addendum)
Secondary to right-sided pneumonia-community-acquired Lactic acidosis has RESOLVED Procalcitonin is reassuring at 0.30  Continue Rocephin and azithromycin for now Monitor cultures Urine Legionella (pending) and strep pneumonia (negative) COVID and flu swab negative

## 2021-07-07 NOTE — ED Provider Notes (Addendum)
[facility] EMERGENCY DEPARTMENT Provider Note   CSN: [id number] Arrival date & time: 06/13/2021  5093     History  Chief Complaint  Patient presents with   Respiratory Distress    Jason Lona. is a 76 y.o. male.  HPI     76 year old male comes in from home with chief complaint of shortness of breath.  Level 5 caveat for acute respiratory distress requiring BiPAP.  According to EMS, patient has been having shortness of breath for couple of days.  They were summoned to his home yesterday.  They noted that he was saturating at 80%, but he declined coming to the ER.  He called in again today and was saturating in the 70s.  Patient was also in the same location as yesterday and smelled of urine.  Patient states that he has a cough, cough is mostly nonproductive.  He denies any fevers or chills.  Indicates that the shortness of breath has been present for at least 2 days now.  He denies any chest pain.  There is no history of PE, DVT, CHF, CAD, COPD, asthma.  Patient does not smoke.  He lives by himself at his home.  Home Medications Prior to Admission medications   Medication Sig Start Date End Date Taking? Authorizing Provider  gabapentin (NEURONTIN) 300 MG capsule Take 1 capsule (300 mg total) by mouth 3 (three) times daily. 06/08/19  Yes Mercy Riding, MD  lisinopril (PRINIVIL,ZESTRIL) 5 MG tablet Take 5 mg daily by mouth.   Yes [provider]  pantoprazole (PROTONIX) 40 MG tablet Take 40 mg by mouth daily.   Yes [provider]  docusate sodium (COLACE) 100 MG capsule Take 1 capsule (100 mg total) by mouth every 12 (twelve) hours. Patient taking differently: Take 100 mg by mouth 2 (two) times daily as needed for mild constipation or moderate constipation.  08/30/16   Mesner, [name], MD      Allergies    Eggs or egg-derived products, Influenza virus vacc split pf, and Dye fdc red [red dye]    Review of Systems   Review of Systems  Unable to  perform ROS: Acuity of condition   Physical Exam Updated Vital Signs BP 90/79    Pulse 95    Temp 97.6 F (36.4 C)    Resp (!) 40    Ht 5\' 5"  (1.651 m)    Wt 100.7 kg    SpO2 (!) 89%    BMI 36.94 kg/m  Physical Exam Vitals and nursing note reviewed.  Constitutional:      Appearance: He is well-developed.  HENT:     Head: Atraumatic.  Cardiovascular:     Rate and Rhythm: Tachycardia present.  Pulmonary:     Effort: Respiratory distress present.     Breath sounds: No wheezing, rhonchi or rales.     Comments: Poor aeration, but patient has no clear evidence of wheezing or rales Musculoskeletal:     Cervical back: Neck supple.     Right lower leg: No edema.     Left lower leg: No edema.  Skin:    General: Skin is warm.  Neurological:     Mental Status: He is alert and oriented to person, place, and time.    ED Results / Procedures / Treatments   Labs (all labs ordered are listed, but only abnormal results are displayed) Labs Reviewed  LACTIC ACID, PLASMA - Abnormal; Notable for the following components:      Result  Value   Lactic Acid, Venous 3.2 (*)    All other components within normal limits  LACTIC ACID, PLASMA - Abnormal; Notable for the following components:   Lactic Acid, Venous 3.6 (*)    All other components within normal limits  COMPREHENSIVE METABOLIC PANEL - Abnormal; Notable for the following components:   Potassium 3.4 (*)    Chloride 112 (*)    CO2 15 (*)    Glucose, Bld 183 (*)    BUN 43 (*)    Creatinine, Ser 2.18 (*)    Calcium 8.1 (*)    Albumin 3.3 (*)    GFR, Estimated 30 (*)    Anion gap 16 (*)    All other components within normal limits  CBC WITH DIFFERENTIAL/PLATELET - Abnormal; Notable for the following components:   WBC 14.1 (*)    Hemoglobin 11.9 (*)    HCT 37.4 (*)    Neutro Abs 11.2 (*)    Basophils Absolute 0.2 (*)    Abs Immature Granulocytes 0.24 (*)    All other components within normal limits  BRAIN NATRIURETIC PEPTIDE -  Abnormal; Notable for the following components:   B Natriuretic Peptide 1,210.0 (*)    All other components within normal limits  BLOOD GAS, ARTERIAL - Abnormal; Notable for the following components:   pH, Arterial 7.27 (*)    pCO2 arterial 31 (*)    pO2, Arterial 59 (*)    Bicarbonate 14.4 (*)    Acid-base deficit 11.5 (*)    All other components within normal limits  TROPONIN I (HIGH SENSITIVITY) - Abnormal; Notable for the following components:   Troponin I (High Sensitivity) 150 (*)    All other components within normal limits  TROPONIN I (HIGH SENSITIVITY) - Abnormal; Notable for the following components:   Troponin I (High Sensitivity) 129 (*)    All other components within normal limits  RESP PANEL BY RT-PCR (FLU A&B, COVID) ARPGX2  CULTURE, BLOOD (ROUTINE X 2)  CULTURE, BLOOD (ROUTINE X 2)  PROTIME-INR  APTT  CK  URINALYSIS, ROUTINE W REFLEX MICROSCOPIC  PROCALCITONIN  LEGIONELLA PNEUMOPHILA SEROGP 1 UR AG  STREP PNEUMONIAE URINARY ANTIGEN    EKG EKG Interpretation  Date/Time:  Tuesday July 07 2021 09:38:06 EST Ventricular Rate:  93 PR Interval:  139 QRS Duration: 139 QT Interval:  402 QTC Calculation: 500 R Axis:   -36 Text Interpretation: Sinus rhythm Right bundle branch block Abnormal T, consider ischemia, lateral leads RBBB is new Nonspecific ST and T wave abnormality Confirmed by Varney Biles ([id number]) on 06/28/2021 10:57:59 AM  Radiology DG Chest Port 1 View  Result Date: 06/19/2021 CLINICAL DATA:  Respiratory distress since yesterday EXAM: PORTABLE CHEST 1 VIEW COMPARISON:  Chest radiograph 06/07/2019 FINDINGS: The heart is enlarged, unchanged. The upper mediastinal contours are within normal limits. There is patchy airspace disease in the right hilar/infrahilar region. There is no other focal consolidation. There is no overt pulmonary edema. There is no significant pleural effusion. There is no pneumothorax. There is no acute osseous abnormality.  IMPRESSION: Opacities in the right hilar/infrahilar regions suspicious for pneumonia in the correct clinical setting. Recommend follow-up radiographs in 3-4 weeks to ensure resolution. Electronically Signed   By: Valetta Mole M.D.   On: 07/03/2021 09:44    Procedures .Critical Care Performed by: Varney Biles, MD Authorized by: Varney Biles, MD   Critical care provider statement:    Critical care time (minutes):  58   Critical care was necessary to treat  or prevent imminent or life-threatening deterioration of the following conditions:  Respiratory failure   Critical care was time spent personally by me on the following activities:  Development of treatment plan with patient or surrogate, discussions with consultants, evaluation of patient's response to treatment, examination of patient, ordering and review of laboratory studies, ordering and review of radiographic studies, ordering and performing treatments and interventions, pulse oximetry, re-evaluation of patient's condition and review of old charts    Medications Ordered in ED Medications  lactated ringers infusion ( Intravenous New Bag/Given 07/05/2021 1010)  cefTRIAXone (ROCEPHIN) 2 g in sodium chloride 0.9 % 100 mL IVPB (0 g Intravenous Stopped 06/12/2021 1026)  azithromycin (ZITHROMAX) 500 mg in sodium chloride 0.9 % 250 mL IVPB (0 mg Intravenous Stopped 06/29/2021 1126)  heparin injection 5,000 Units (has no administration in time range)  acetaminophen (TYLENOL) tablet 650 mg (has no administration in time range)    Or  acetaminophen (TYLENOL) suppository 650 mg (has no administration in time range)  ondansetron (ZOFRAN) tablet 4 mg (has no administration in time range)    Or  ondansetron (ZOFRAN) injection 4 mg (has no administration in time range)  ipratropium-albuterol (DUONEB) 0.5-2.5 (3) MG/3ML nebulizer solution 3 mL (has no administration in time range)  potassium chloride SA (KLOR-CON M) CR tablet 40 mEq (has no administration  in time range)  sodium chloride 0.9 % bolus 1,000 mL (0 mLs Intravenous Stopped 06/23/2021 1115)  albuterol (PROVENTIL) (2.5 MG/3ML) 0.083% nebulizer solution 10 mg (10 mg Nebulization Given by Other 06/22/2021 0955)  ipratropium (ATROVENT) nebulizer solution 1 mg (1 mg Nebulization Given by Other 06/26/2021 0955)  lactated ringers bolus 1,500 mL (1,500 mLs Intravenous New Bag/Given 07/06/2021 1152)    ED Course/ Medical Decision Making/ A&P Clinical Course as of 06/25/2021 1236  Tue Jul 07, 2021  1104 We attempted to wean patient off of the BiPAP onto nonrebreather.  Patient was still noted to be slightly hypoxic.  He has been placed on BiPAP, to reduce work of breathing and also avoid unnecessary hypoxemia [AN]  1104 Resp Panel by RT-PCR (Flu A&B, Covid) Nasopharyngeal Swab COVID-19 is negative, flu is negative. [AN]  1104 DG Chest Port 1 View X-ray visualized independently.  There is no evidence of large pleural effusion.  Clear right-sided new opacity, suspicion is high for pneumonia.  Antibiotics initiated [AN]  1105 Creatinine(!): 2.18 Patient has AKI.  CK has been added to his work-up [AN]  1105 Lactic Acid, Venous(!!): 3.2 Lactic acidosis secondary to work of breathing and sepsis.  IV fluids ordered.  Patient is not in septic shock [AN]  1105 Troponin I (High Sensitivity)(!!) Likely demand related, but in the setting of also BNP being elevated, will benefit with echocardiogram.  Suspicion for ACS is low.  EKG does not show any evidence of STEMI [AN]  1235 Troponin I (High Sensitivity)(!!): 129 stabilized [AN]    Clinical Course User Index [AN] Varney Biles, MD                           Medical Decision Making Amount and/or Complexity of Data Reviewed Labs: ordered. Decision-making details documented in ED Course. Radiology: ordered. Decision-making details documented in ED Course. ECG/medicine tests: ordered.  Risk Prescription drug management. Decision regarding  hospitalization.   This patient presents to the ED with chief complaint(s) of shortness of breath and respiratory distress with pertinent past medical history that is negative for any underlying lung disease, cardiac  disease which further complicates the presenting complaint. The complaint involves an extensive differential diagnosis and treatment options and also carries with it a high risk of complications and morbidity.    Patient arrives with respiratory distress.  He is on BiPAP and O2 sats were in the upper 80s.  He has no clear evidence of rales or wheezing and no clear evidence of volume overload.  Patient has foul-smelling urine order on his clothes.  The differential diagnosis includes : Acute respiratory failure with hypoxia due to pleural effusion, pulmonary edema, CHF/valvular disorder, COPD exacerbation, pneumonia, COVID-19, PE.  Patient has no chest pain, suspicion for ACS is lower.  The initial plan is to order CBC, metabolic profile, troponin, BNP and a blood gas and to treat patient with breathing treatment for his respiratory distress and hypoxia.   Additional history obtained: Records reviewed  reviewed previous echocardiogram results, which were reassuring.  Reassessment and review (also see workup area):   Consultations Obtained: I requested consultation with the admitting physician Dr. Manuella Ghazi , and discussed  findings as well as pertinent plan.  They are comfortable with admission for.  They are aware that the troponin will need to be trended and that the BNP is high, but there is no clear evidence of volume overload on our evaluation.  They are also aware of AKI.   Medicines ordered and prescription drug management: Patient is receiving IV antibiotics for pneumonia and BiPAP treatment.   Reevaluation of the patient after these medicines showed that the patient    improved   Complexity of problems addressed: Patients presentation is most consistent with  acute  presentation with potential threat to life or bodily function During patient's assessment  Disposition: After consideration of the diagnostic results and the patients response to treatment,  I feel that the patent would benefit from admission   .     Final Clinical Impression(s) / ED Diagnoses Final diagnoses:  AKI (acute kidney injury) ([place])  Acute hypoxemic respiratory failure ([place])  Community acquired pneumonia, unspecified laterality    Rx / DC Orders ED Discharge Orders     None         Varney Biles, MD 06/30/2021 1108    Varney Biles, MD 07/03/2021 1236

## 2021-07-07 NOTE — Progress Notes (Signed)
[facility] will follow Code Sepsis

## 2021-07-07 NOTE — ED Notes (Signed)
Admitting provider at bedside.

## 2021-07-08 ENCOUNTER — Inpatient Hospital Stay (HOSPITAL_COMMUNITY): Payer: No Typology Code available for payment source

## 2021-07-08 DIAGNOSIS — J9601 Acute respiratory failure with hypoxia: Secondary | ICD-10-CM | POA: Diagnosis not present

## 2021-07-08 DIAGNOSIS — I248 Other forms of acute ischemic heart disease: Secondary | ICD-10-CM

## 2021-07-08 DIAGNOSIS — J189 Pneumonia, unspecified organism: Secondary | ICD-10-CM | POA: Diagnosis present

## 2021-07-08 DIAGNOSIS — R778 Other specified abnormalities of plasma proteins: Secondary | ICD-10-CM

## 2021-07-08 DIAGNOSIS — A419 Sepsis, unspecified organism: Secondary | ICD-10-CM | POA: Diagnosis not present

## 2021-07-08 DIAGNOSIS — N179 Acute kidney failure, unspecified: Secondary | ICD-10-CM | POA: Diagnosis not present

## 2021-07-08 LAB — ECHOCARDIOGRAM COMPLETE
AR max vel: 2.59 cm2
AV Area VTI: 2.65 cm2
AV Area mean vel: 2.57 cm2
AV Mean grad: 2 mmHg
AV Peak grad: 4.5 mmHg
Ao pk vel: 1.06 m/s
Area-P 1/2: 6.02 cm2
Calc EF: 57.6 %
Height: 65 in
MV VTI: 2.32 cm2
S' Lateral: 2.5 cm
Single Plane A2C EF: 54.9 %
Single Plane A4C EF: 60.6 %
Weight: 3340.41 oz

## 2021-07-08 LAB — URINALYSIS, ROUTINE W REFLEX MICROSCOPIC
Bilirubin Urine: NEGATIVE
Glucose, UA: NEGATIVE mg/dL
Hgb urine dipstick: NEGATIVE
Ketones, ur: NEGATIVE mg/dL
Leukocytes,Ua: NEGATIVE
Nitrite: NEGATIVE
Protein, ur: 30 mg/dL — AB
Specific Gravity, Urine: 1.018 (ref 1.005–1.030)
pH: 6 (ref 5.0–8.0)

## 2021-07-08 LAB — COMPREHENSIVE METABOLIC PANEL
ALT: 21 U/L (ref 0–44)
AST: 30 U/L (ref 15–41)
Albumin: 3.1 g/dL — ABNORMAL LOW (ref 3.5–5.0)
Alkaline Phosphatase: 104 U/L (ref 38–126)
Anion gap: 14 (ref 5–15)
BUN: 39 mg/dL — ABNORMAL HIGH (ref 8–23)
CO2: 15 mmol/L — ABNORMAL LOW (ref 22–32)
Calcium: 8.3 mg/dL — ABNORMAL LOW (ref 8.9–10.3)
Chloride: 114 mmol/L — ABNORMAL HIGH (ref 98–111)
Creatinine, Ser: 1.66 mg/dL — ABNORMAL HIGH (ref 0.61–1.24)
GFR, Estimated: 42 mL/min — ABNORMAL LOW (ref >60)
Glucose, Bld: 148 mg/dL — ABNORMAL HIGH (ref 70–99)
Potassium: 4 mmol/L (ref 3.5–5.1)
Sodium: 143 mmol/L (ref 135–145)
Total Bilirubin: 0.7 mg/dL (ref 0.3–1.2)
Total Protein: 6.8 g/dL (ref 6.5–8.1)

## 2021-07-08 LAB — CBC
HCT: 36.6 % — ABNORMAL LOW (ref 39.0–52.0)
Hemoglobin: 11.7 g/dL — ABNORMAL LOW (ref 13.0–17.0)
MCH: 27.5 pg (ref 26.0–34.0)
MCHC: 32 g/dL (ref 30.0–36.0)
MCV: 86.1 fL (ref 80.0–100.0)
Platelets: 182 10*3/uL (ref 150–400)
RBC: 4.25 MIL/uL (ref 4.22–5.81)
RDW: 14.7 % (ref 11.5–15.5)
WBC: 18.8 10*3/uL — ABNORMAL HIGH (ref 4.0–10.5)
nRBC: 0.3 % — ABNORMAL HIGH (ref 0.0–0.2)

## 2021-07-08 LAB — MAGNESIUM: Magnesium: 2.2 mg/dL (ref 1.7–2.4)

## 2021-07-08 LAB — LACTIC ACID, PLASMA: Lactic Acid, Venous: 1.7 mmol/L (ref 0.5–1.9)

## 2021-07-08 LAB — PROCALCITONIN: Procalcitonin: 0.3 ng/mL

## 2021-07-08 LAB — STREP PNEUMONIAE URINARY ANTIGEN: Strep Pneumo Urinary Antigen: NEGATIVE

## 2021-07-08 LAB — GLUCOSE, CAPILLARY
Glucose-Capillary: 143 mg/dL — ABNORMAL HIGH (ref 70–99)
Glucose-Capillary: 118 mg/dL — ABNORMAL HIGH (ref 70–99)
Glucose-Capillary: 152 mg/dL — ABNORMAL HIGH (ref 70–99)
Glucose-Capillary: 121 mg/dL — ABNORMAL HIGH (ref 70–99)

## 2021-07-08 MED ORDER — FUROSEMIDE 10 MG/ML IJ SOLN
40.0000 mg | Freq: Once | INTRAMUSCULAR | Status: AC
  Administered 2021-07-08: 40 mg via INTRAVENOUS
  Filled 2021-07-08: qty 4

## 2021-07-08 MED ORDER — PERFLUTREN LIPID MICROSPHERE
1.0000 mL | INTRAVENOUS | Status: AC | PRN
  Administered 2021-07-08: 5 mL via INTRAVENOUS
  Filled 2021-07-08: qty 10

## 2021-07-08 MED ORDER — LORAZEPAM 0.5 MG PO TABS
0.5000 mg | ORAL_TABLET | Freq: Four times a day (QID) | ORAL | Status: DC | PRN
  Administered 2021-07-08 – 2021-07-09 (×4): 0.5 mg via ORAL
  Filled 2021-07-08 (×4): qty 1

## 2021-07-08 MED ORDER — LACTATED RINGERS IV SOLN: INTRAVENOUS | Status: DC

## 2021-07-08 MED ORDER — GABAPENTIN 100 MG PO CAPS
200.0000 mg | ORAL_CAPSULE | Freq: Three times a day (TID) | ORAL | Status: DC
Start: 2021-07-08 — End: 2021-07-13
  Administered 2021-07-08 – 2021-07-12 (×15): 200 mg via ORAL
  Filled 2021-07-08 (×15): qty 2

## 2021-07-08 MED ORDER — METOPROLOL TARTRATE 25 MG PO TABS
25.0000 mg | ORAL_TABLET | Freq: Two times a day (BID) | ORAL | Status: DC
  Administered 2021-07-08 – 2021-07-16 (×18): 25 mg via ORAL
  Filled 2021-07-08 (×18): qty 1

## 2021-07-08 MED ORDER — HYDROCODONE-ACETAMINOPHEN 5-325 MG PO TABS
0.5000 | ORAL_TABLET | Freq: Four times a day (QID) | ORAL | Status: DC | PRN
  Administered 2021-07-08 – 2021-07-09 (×6): 1 via ORAL
  Filled 2021-07-08 (×6): qty 1

## 2021-07-08 NOTE — Progress Notes (Signed)
PROGRESS NOTE   Jason Holt.  VOH:[id number] DOB: 09-29-1945 DOA: 06/27/2021 PCP: [facility]   Chief Complaint  Patient presents with   Respiratory Distress   Level of care: Stepdown  Brief Admission History:   76 y.o. male with medical history significant of hypertension, depression, and GERD who presented to the ED with worsening shortness of breath over the last 2-3 days.  EMS was called to his home yesterday and he was noted to be saturating in the 80th percentile while on room air, but he declined coming to the ED.  EMS was called again to his home and he was saturating in the 70th percentile this time.  He was found in his chair at home and was sitting in some urine.  According to his daughter at bedside who is his caretaker he has not had much of a cough, fevers, or chills.  No chest pain, nausea, vomiting or sick contacts noted.  He apparently lives at home by himself and does not smoke.   In the ED, chest x-ray noted to have findings of right sided pneumonia as well as leukocytosis.  Due to his significant hypoxemia and tachypnea he was started on BiPAP to which she is now more comfortable.  His lactic acid was 3.2 and is elevating to 3.6 and he has only received 1 L IV fluid and has been started on azithromycin and Rocephin.  EKG was sinus rhythm 93 bpm and troponin elevated at 150.  BNP is 1210.  Creatinine is 2.18 with baseline 0.8-0.9.  Flu and COVID testing negative.  07/08/2021:  Pt tolerating nightly bipap and it seems to be helping.    Assessment and Plan: * Severe sepsis ([name])- (present on admission) Secondary to right-sided pneumonia-community-acquired Lactic acidosis has RESOLVED Procalcitonin is reassuring at 0.30  Continue Rocephin and azithromycin for now Monitor cultures Urine Legionella (pending) and strep pneumonia (negative) COVID and flu swab negative  Acute respiratory failure with hypoxia ([name])- (present on admission) Secondary to  pneumonia Continue on BiPAP for now and wean as tolerated Appreciate PCCM evaluation and plan is to continue nightly bipap for now  Pressure injury of skin- (present on admission) Pressure Injury 06/16/2021 Buttocks Left Stage 2 -  Partial thickness loss of dermis presenting as a shallow open injury with a red, pink wound bed without slough. (Active)  07/03/2021 1308  Location: Buttocks  Location Orientation: Left  Staging: Stage 2 -  Partial thickness loss of dermis presenting as a shallow open injury with a red, pink wound bed without slough.  Wound Description (Comments):   Present on Admission: Yes     Pressure Injury 07/06/2021 Buttocks Right Stage 2 -  Partial thickness loss of dermis presenting as a shallow open injury with a red, pink wound bed without slough. (Active)  06/18/2021 1309  Location: Buttocks  Location Orientation: Right  Staging: Stage 2 -  Partial thickness loss of dermis presenting as a shallow open injury with a red, pink wound bed without slough.  Wound Description (Comments):   Present on Admission: Yes    Elevated troponin- (present on admission) Likely related to demand with sepsis physiology Continue to monitor 2D echocardiogram 07/08/21: 1. Left ventricular ejection fraction, by estimation, is 60 to 65%. The left ventricle has normal function. The left ventricle has no regional wall motion abnormalities. There is mild concentric left ventricular hypertrophy. Left ventricular diastolic parameters are consistent with Grade II diastolic dysfunction (pseudonormalization). There is the interventricular septum is flattened  in diastole ('D' shaped left ventricle), consistent with right ventricular volume overload.   2. Right ventricular systolic function is mildly reduced. The right ventricular size is dilated. There is moderately elevated pulmonary artery systolic pressure. RV mid wall not well visualized.   3. The mitral valve is grossly normal. No evidence of mitral valve  regurgitation. No evidence of mitral stenosis.   4. The aortic valve is tricuspid. Aortic valve regurgitation is not visualized. No aortic stenosis is present.   AKI (acute kidney injury) ([name])- (present on admission) Baseline creatinine 0.8-0.9 Likely related to severe dehydration Improving with hydration Avoid nephrotoxic agents Monitor strict I's and O's Continue IV fluid   Essential hypertension- (present on admission) Hold home lisinopril given AKI Currently with softer blood pressure readings with sepsis physiology, monitor  GERD (gastroesophageal reflux disease)- (present on admission) Continue on IV PPI until off BiPAP   DVT prophylaxis: [place] heparin Code Status: full  Family Communication:  Disposition: Status is: Inpatient Remains inpatient appropriate because: IV antibiotics required  Consultants:  PCCM  Procedures:   Antimicrobials:    Subjective: Glad to be off bipap for a while. Hungry and wanting to eat something.  No SOB, no CP symptoms at this time.  Objective: Vitals:   07/08/21 1506 07/08/21 1554 07/08/21 1600 07/08/21 1700  BP:    139/85  Pulse: 91  95 88  Resp: (!) 22  (!) 27 (!) 28  Temp:  (!) 95.9 F (35.5 C)    TempSrc:  Axillary    SpO2: 100%  99% 93%  Weight:      Height:        Intake/Output Summary (Last 24 hours) at 07/08/2021 1715 Last data filed at 07/08/2021 1513 Gross per 24 hour  Intake 585.86 ml  Output --  Net 585.86 ml   Filed Weights   07/04/2021 0926 06/26/2021 1248 07/08/21 0351  Weight: 100.7 kg 94.2 kg 94.7 kg   Examination:  General exam: chronically ill appearing male, awake, alert, off bipap on [place], Appears calm and comfortable  Respiratory system: poor air movement, moderate increased work of breathing.  Cardiovascular system: normal S1 & S2 heard. No JVD, murmurs, rubs, gallops or clicks. No pedal edema. Gastrointestinal system: Abdomen is nondistended, soft and nontender. No organomegaly or masses felt. Normal bowel  sounds heard. Central nervous system: Alert and oriented. No focal neurological deficits. Extremities: Symmetric 5 x 5 power. Skin: No rashes, lesions or ulcers. Psychiatry: Judgement and insight appear poor. Mood & affect appropriate.   Data Reviewed: I have personally reviewed following labs and imaging studies  CBC: Recent Labs  Lab 06/14/2021 0950 07/08/21 0631  WBC 14.1* 18.8*  NEUTROABS 11.2*  --   HGB 11.9* 11.7*  HCT 37.4* 36.6*  MCV 87.4 86.1  PLT 234 517    Basic Metabolic Panel: Recent Labs  Lab 06/27/2021 0950 07/08/21 0339  NA 143 143  K 3.4* 4.0  CL 112* 114*  CO2 15* 15*  GLUCOSE 183* 148*  BUN 43* 39*  CREATININE 2.18* 1.66*  CALCIUM 8.1* 8.3*  MG  --  2.2    CBG: Recent Labs  Lab 06/20/2021 2304 07/08/21 0345 07/08/21 0720 07/08/21 1144 07/08/21 1550  GLUCAP 169* 143* 118* 152* 121*    Recent Results (from the past 240 hour(s))  Resp Panel by RT-PCR (Flu A&B, Covid) Nasopharyngeal Swab     Status: None   Collection Time: 07/05/2021  9:28 AM   Specimen: Nasopharyngeal Swab; Nasopharyngeal(NP) swabs in vial transport  medium  Result Value Ref Range Status   SARS Coronavirus 2 by RT PCR NEGATIVE NEGATIVE Final    Comment: (NOTE) SARS-CoV-2 target nucleic acids are NOT DETECTED.  The SARS-CoV-2 RNA is generally detectable in upper respiratory specimens during the acute phase of infection. The lowest concentration of SARS-CoV-2 viral copies this assay can detect is 138 copies/mL. A negative result does not preclude SARS-Cov-2 infection and should not be used as the sole basis for treatment or other patient management decisions. A negative result may occur with  improper specimen collection/handling, submission of specimen other than nasopharyngeal swab, presence of viral mutation(s) within the areas targeted by this assay, and inadequate number of viral copies(<138 copies/mL). A negative result must be combined with clinical observations, patient  history, and epidemiological information. The expected result is Negative.  Fact Sheet for Patients:  EntrepreneurPulse.com.au  Fact Sheet for Healthcare Providers:  IncredibleEmployment.be  This test is no t yet approved or cleared by the [place] FDA and  has been authorized for detection and/or diagnosis of SARS-CoV-2 by FDA under an Emergency Use Authorization (EUA). This EUA will remain  in effect (meaning this test can be used) for the duration of the COVID-19 declaration under Section 564(b)(1) of the Act, 21 U.S.C.section 360bbb-3(b)(1), unless the authorization is terminated  or revoked sooner.       Influenza A by PCR NEGATIVE NEGATIVE Final   Influenza B by PCR NEGATIVE NEGATIVE Final    Comment: (NOTE) The Xpert Xpress SARS-CoV-2/FLU/RSV plus assay is intended as an aid in the diagnosis of influenza from Nasopharyngeal swab specimens and should not be used as a sole basis for treatment. Nasal washings and aspirates are unacceptable for Xpert Xpress SARS-CoV-2/FLU/RSV testing.  Fact Sheet for Patients: EntrepreneurPulse.com.au  Fact Sheet for Healthcare Providers: IncredibleEmployment.be  This test is not yet approved or cleared by the [place] FDA and has been authorized for detection and/or diagnosis of SARS-CoV-2 by FDA under an Emergency Use Authorization (EUA). This EUA will remain in effect (meaning this test can be used) for the duration of the COVID-19 declaration under Section 564(b)(1) of the Act, 21 U.S.C. section 360bbb-3(b)(1), unless the authorization is terminated or revoked.  Performed at [facility], [address] [place], [place] [address]   Blood Culture (routine x 2)     Status: None (Preliminary result)   Collection Time: 06/30/2021  9:31 AM   Specimen: BLOOD  Result Value Ref Range Status   Specimen Description BLOOD BLOOD LEFT ARM  Final   Special Requests    Final    BOTTLES DRAWN AEROBIC AND ANAEROBIC Blood Culture adequate volume   Culture   Final    NO GROWTH < 24 HOURS Performed at [facility], [address] [place], [place] [address]    Report Status PENDING  Incomplete  Blood Culture (routine x 2)     Status: None (Preliminary result)   Collection Time: 06/17/2021  9:51 AM   Specimen: BLOOD  Result Value Ref Range Status   Specimen Description BLOOD RIGHT ANTECUBITAL  Final   Special Requests   Final    BOTTLES DRAWN AEROBIC AND ANAEROBIC Blood Culture adequate volume   Culture   Final    NO GROWTH < 24 HOURS Performed at [facility], [address], [place],  [address]    Report Status PENDING  Incomplete  MRSA Next Gen by PCR, Nasal     Status: None   Collection Time: 06/20/2021  1:11 PM  Specimen: Nasal Mucosa; Nasal Swab  Result Value Ref Range Status   MRSA by PCR Next Gen NOT DETECTED NOT DETECTED Final    Comment: (NOTE) The GeneXpert MRSA Assay (FDA approved for NASAL specimens only), is one component of a comprehensive MRSA colonization surveillance program. It is not intended to diagnose MRSA infection nor to guide or monitor treatment for MRSA infections. Test performance is not FDA approved in patients less than 45 years old. Performed at [facility], [address] [place], [place] [address]      Radiology Studies: [facility] Chest [facility] 1 View  Result Date: 07/08/2021 CLINICAL DATA:  Community-acquired pneumonia.  Dyspnea. EXAM: PORTABLE CHEST 1 VIEW COMPARISON:  07/02/2021 FINDINGS: The cardiac silhouette remains enlarged. Lung volumes remain low. Right perihilar/infrahilar airspace opacities on the prior study have improved with mild residual opacity remaining. Mild left basilar opacity likely reflects atelectasis or scarring. No sizable pleural effusion or pneumothorax is identified. No acute osseous abnormality is seen. IMPRESSION: Decreasing right perihilar/infrahilar opacity suggestive of improving pneumonia.  Electronically Signed   By: Logan Bores M.D.   On: 07/08/2021 08:09   DG Chest Port 1 View  Result Date: 06/30/2021 CLINICAL DATA:  Respiratory distress since yesterday EXAM: PORTABLE CHEST 1 VIEW COMPARISON:  Chest radiograph 06/07/2019 FINDINGS: The heart is enlarged, unchanged. The upper mediastinal contours are within normal limits. There is patchy airspace disease in the right hilar/infrahilar region. There is no other focal consolidation. There is no overt pulmonary edema. There is no significant pleural effusion. There is no pneumothorax. There is no acute osseous abnormality. IMPRESSION: Opacities in the right hilar/infrahilar regions suspicious for pneumonia in the correct clinical setting. Recommend follow-up radiographs in 3-4 weeks to ensure resolution. Electronically Signed   By: Valetta Mole M.D.   On: 07/01/2021 09:44   ECHOCARDIOGRAM COMPLETE  Result Date: 07/08/2021    ECHOCARDIOGRAM REPORT   Patient Name:   Jason Holt. Date of Exam: 07/08/2021 Medical Rec #:  [id number]               Height:       65.0 in Accession #:    [id number]              Weight:       208.8 lb Date of Birth:  17-Mar-1946               BSA:          2.015 m Patient Age:    74 years                BP:           170/62 mmHg Patient Gender: M                       HR:           93 bpm. Exam Location:  [facility] Procedure: 2D Echo, Cardiac Doppler, Color Doppler and Intracardiac            Opacification Agent Indications:    Elevated Troponin  History:        Patient has prior history of Echocardiogram examinations, most                 recent 02/18/2011. Risk Factors:Hypertension. Sepsis.  Sonographer:    Wenda Low Referring Phys: [id number] [place] [facility]  Sonographer Comments: Patient is morbidly obese. IMPRESSIONS  1. Left ventricular ejection fraction, by estimation, is 60 to  65%. The left ventricle has normal function. The left ventricle has no regional wall motion abnormalities. There is mild concentric  left ventricular hypertrophy. Left ventricular diastolic parameters are consistent with Grade II diastolic dysfunction (pseudonormalization). There is the interventricular septum is flattened in diastole ('D' shaped left ventricle), consistent with right ventricular volume overload.  2. Right ventricular systolic function is mildly reduced. The right ventricular size is dilated. There is moderately elevated pulmonary artery systolic pressure. RV mid wall not well visualized.  3. The mitral valve is grossly normal. No evidence of mitral valve regurgitation. No evidence of mitral stenosis.  4. The aortic valve is tricuspid. Aortic valve regurgitation is not visualized. No aortic stenosis is present. Comparison(s): Unable to see 2012 study. Conclusion(s)/Recommendation(s): RV dysfunction and dilation (unclear if new, no true McConnells sign) in the setting of elevated troponin, PE is in differential. FINDINGS  Left Ventricle: Left ventricular ejection fraction, by estimation, is 60 to 65%. The left ventricle has normal function. The left ventricle has no regional wall motion abnormalities. Definity contrast agent was given IV to delineate the left ventricular  endocardial borders. The left ventricular internal cavity size was normal in size. There is mild concentric left ventricular hypertrophy. The interventricular septum is flattened in diastole ('D' shaped left ventricle), consistent with right ventricular  volume overload. Left ventricular diastolic parameters are consistent with Grade II diastolic dysfunction (pseudonormalization). Right Ventricle: RVOT is dilated. The right ventricular size is moderately enlarged. No increase in right ventricular wall thickness. Right ventricular systolic function is mildly reduced. There is moderately elevated pulmonary artery systolic pressure. The tricuspid regurgitant velocity is 3.11 m/s, and with an assumed right atrial pressure of 15 mmHg, the estimated right ventricular  systolic pressure is 95.2 mmHg. Left Atrium: Left atrial size was normal in size. Right Atrium: Right atrial size was normal in size. Pericardium: Trivial pericardial effusion is present. Presence of epicardial fat layer. Mitral Valve: The mitral valve is grossly normal. No evidence of mitral valve regurgitation. No evidence of mitral valve stenosis. MV peak gradient, 6.4 mmHg. The mean mitral valve gradient is 2.0 mmHg. Tricuspid Valve: The tricuspid valve is normal in structure. Tricuspid valve regurgitation is mild. Aortic Valve: The aortic valve is tricuspid. There is mild aortic valve annular calcification. Aortic valve regurgitation is not visualized. No aortic stenosis is present. Aortic valve mean gradient measures 2.0 mmHg. Aortic valve peak gradient measures 4.5 mmHg. Aortic valve area, by VTI measures 2.65 cm. Pulmonic Valve: The pulmonic valve was normal in structure. Pulmonic valve regurgitation is mild. No evidence of pulmonic stenosis. Aorta: The aortic root and ascending aorta are structurally normal, with no evidence of dilitation. IAS/Shunts: No atrial level shunt detected by color flow Doppler.  LEFT VENTRICLE PLAX 2D LVIDd:         4.00 cm     Diastology LVIDs:         2.50 cm     LV e' medial:    4.24 cm/s LV PW:         1.00 cm     LV E/e' medial:  16.2 LV IVS:        1.30 cm     LV e' lateral:   5.44 cm/s LVOT diam:     2.00 cm     LV E/e' lateral: 12.6 LV SV:         46 LV SV Index:   23 LVOT Area:     3.14 cm  LV Volumes (MOD) LV  vol d, MOD A2C: 36.8 ml LV vol d, MOD A4C: 30.7 ml LV vol s, MOD A2C: 16.6 ml LV vol s, MOD A4C: 12.1 ml LV SV MOD A2C:     20.2 ml LV SV MOD A4C:     30.7 ml LV SV MOD BP:      19.3 ml RIGHT VENTRICLE RV Basal diam:  4.50 cm RV Mid diam:    4.00 cm RV S prime:     9.90 cm/s TAPSE (M-mode): 1.3 cm LEFT ATRIUM             Index        RIGHT ATRIUM           Index LA diam:        4.10 cm 2.04 cm/m   RA Area:     19.30 cm LA Vol (A2C):   45.1 ml 22.39 ml/m  RA  Volume:   62.50 ml  31.02 ml/m LA Vol (A4C):   47.2 ml 23.43 ml/m LA Biplane Vol: 48.7 ml 24.17 ml/m  AORTIC VALVE                    PULMONIC VALVE AV Area (Vmax):    2.59 cm     PV Vmax:       0.54 m/s AV Area (Vmean):   2.57 cm     PV Peak grad:  1.2 mmHg AV Area (VTI):     2.65 cm AV Vmax:           106.00 cm/s AV Vmean:          59.300 cm/s AV VTI:            0.172 m AV Peak Grad:      4.5 mmHg AV Mean Grad:      2.0 mmHg LVOT Vmax:         87.50 cm/s LVOT Vmean:        48.500 cm/s LVOT VTI:          0.145 m LVOT/AV VTI ratio: 0.84  AORTA Ao Root diam: 2.80 cm Ao Asc diam:  3.30 cm MITRAL VALVE                TRICUSPID VALVE MV Area (PHT): 6.02 cm     TR Peak grad:   38.7 mmHg MV Area VTI:   2.32 cm     TR Vmax:        311.00 cm/s MV Peak grad:  6.4 mmHg MV Mean grad:  2.0 mmHg     SHUNTS MV Vmax:       1.26 m/s     Systemic VTI:  0.14 m MV Vmean:      63.8 cm/s    Systemic Diam: 2.00 cm MV Decel Time: 126 msec MV E velocity: 68.60 cm/s MV A velocity: 114.00 cm/s MV E/A ratio:  0.60 Rudean Haskell MD Electronically signed by Rudean Haskell MD Signature Date/Time: 07/08/2021/3:49:54 PM    Final     Scheduled Meds:  Chlorhexidine Gluconate Cloth  6 each Topical Q0600   gabapentin  200 mg Oral TID   heparin  5,000 Units Subcutaneous Q8H   metoprolol tartrate  25 mg Oral BID   pantoprazole  40 mg Oral Daily   potassium chloride  40 mEq Oral Once   Continuous Infusions:  azithromycin Stopped (07/08/21 1101)   cefTRIAXone (ROCEPHIN)  IV Stopped (07/08/21 0947)   lactated ringers 65 mL/hr at 07/08/21 1513  LOS: 1 day   Time spent: 40 mins  Landen Breeland Wynetta Emery, MD How to contact the [facility] Attending or Consulting provider [place] or covering provider during after hours [place], for this patient?  Check the care team in [facility] and look for a) attending/consulting TR[facility] provider listed and b) the [facility] team listed Log into [email or web address] and use 's universal password to access. If  you do not have the password, please contact the hospital operator. Locate the [facility] provider you are looking for under [facility] and page to a number that you can be directly reached. If you still have difficulty reaching the provider, please page the [facility] (Director on Call) for the Hospitalists listed on amion for assistance.  07/08/2021, 5:15 PM

## 2021-07-08 NOTE — Assessment & Plan Note (Addendum)
Pressure Injury 06/29/2021 Buttocks Left Stage 2 -  Partial thickness loss of dermis presenting as a shallow open injury with a red, pink wound bed without slough. (Active)  ?06/30/2021 1308  ?Location: Buttocks  ?Location Orientation: Left  ?Staging: Stage 2 -  Partial thickness loss of dermis presenting as a shallow open injury with a red, pink wound bed without slough.  ?Wound Description (Comments):   ?Present on Admission: Yes  ?   ?Pressure Injury 06/26/2021 Buttocks Right Stage 2 -  Partial thickness loss of dermis presenting as a shallow open injury with a red, pink wound bed without slough. (Active)  ?06/27/2021 1309  ?Location: Buttocks  ?Location Orientation: Right  ?Staging: Stage 2 -  Partial thickness loss of dermis presenting as a shallow open injury with a red, pink wound bed without slough.  ?Wound Description (Comments):   ?Present on Admission: Yes  ? ?

## 2021-07-08 NOTE — Progress Notes (Signed)
RT took patient off of BIPAP and placed on 15L HFNC. Patient is tolerating well at this time. Will continue to monitor as needed. ?

## 2021-07-08 NOTE — Progress Notes (Signed)
?  Transition of Care (TOC) Screening Note ? ? ?Patient Details  ?Name: Jason Holt. ?Date of Birth: 07/27/1945 ? ? ?Transition of Care (TOC) CM/SW Contact:    ?Iona Beard, LCSWA ?Phone Number: ?07/08/2021, 1:49 PM ? ?CSW updated [facility] admission. [place] notification ID is [id number].  ? ?Transition of Care Department [facility]) has reviewed patient and no TOC needs have been identified at this time. We will continue to monitor patient advancement through interdisciplinary progression rounds. If new patient transition needs arise, please place a TOC consult. ? ? ? ?

## 2021-07-08 NOTE — Hospital Course (Addendum)
76 y.o. male with medical history significant of hypertension, depression, and GERD who presented to the ED with worsening shortness of breath over the last 2-3 days.  EMS was called to his home yesterday and he was noted to be saturating in the 80th percentile while on room air, but he declined coming to the ED.  EMS was called again to his home and he was saturating in the 70th percentile this time.  He was found in his chair at home and was sitting in some urine.  According to his daughter at bedside who is his caretaker he has not had much of a cough, fevers, or chills.  No chest pain, nausea, vomiting or sick contacts noted.  He apparently lives at home by himself and does not smoke. ?  ?In the ED, chest x-ray noted to have findings of right sided pneumonia as well as leukocytosis.  Due to his significant hypoxemia and tachypnea he was started on BiPAP to which she is now more comfortable.  His lactic acid was 3.2 and is elevating to 3.6 and he has only received 1 L IV fluid and has been started on azithromycin and Rocephin.  EKG was sinus rhythm 93 bpm and troponin elevated at 150.  BNP is 1210.  Creatinine is 2.18 with baseline 0.8-0.9.  Flu and COVID testing negative. ? ?07/08/2021:  Pt tolerating nightly bipap and it seems to be helping.  ? ?07/09/2021: clinically improving. WBC bump.  ? ?07/10/2021:  high oxygen requirement, d dimer checked, 7.34, CTA chest ordered: positive for bilateral PE with right heart strain.  IV heparin started. Likely transfer to [facility] for IR to evaluate for possible intervention.   ? ?07/11/2021: pt remains dyspneic but no worse than yesterday.  Waiting to transfer to [facility] for IR evaluation.  ?

## 2021-07-08 NOTE — Progress Notes (Signed)
*  PRELIMINARY RESULTS* ?Echocardiogram ?2D Echocardiogram has been performed. ? ?Jason Holt ?07/08/2021, 3:15 PM ?

## 2021-07-08 NOTE — Progress Notes (Signed)
Discussed Echo results with pt and his daughters.  He has chronic diastolic dysfunction.  Has mild RV dysfunction with mild elevation in PA pressures.  Could be related to acute hypoxia, but I suspect he also has sleep apnea. ? ?Will order lasix 40 mg IV x one.  Explained he will need sleep assessment as outpt. ? ?Chesley Mires, MD ?[place] ?Pager - [phone] ?07/08/2021, 5:28 PM ? ?

## 2021-07-08 NOTE — Progress Notes (Signed)
[facility] ? ? ?Patient name: Jason Holt. Admit date: 07/05/2021  ?DOB: 1946/02/16 LOS: 1  ?MRN: [id number] Consult date: 06/11/2021  ?Referring provider: Dr. Manuella Ghazi, [facility]: short of breath  ? ? ?History:  ?76 yo male presented to [facility] with dyspnea for 3 days.  EMS called on 2/27 and he had SpO2 in the 80's on room air.  He declined option to come to ER then.  Symptoms got worse and he came to ER.  SpO2 in 70's on room air.  Found to have Rt sided pneumonia.  Started on IV fluids and antibiotics for sepsis, and Bipap with oxygen for respiratory failure.  PCCM consulted to assist with management in ICU. ? ?Past medical history:  ?HTN, Depression, GERD ? ?Significant events:  ?2/28 Admit ? ?Studies:  ?Echo 3/01 >>  ? ?Micro:  ?COVID/Flu 2/28 >> negative ?Blood 2/28 >>  ?Legionella 2/28 >> ?Pneumococcal 2/28 >> negative ? Lines:  ? ?  ?Antibiotics:  ?Rocephin 2/28 >> ?Zithromax 2/28 >>  ? Consults:  ? ?  ? ?Interim history:  ?Wore Bipap last night.  This helped.  Breathing better today.  Pain meds helped back pain and neurontin helped leg pains.  Hasn't gotten out of bed yet.  CXR shows decreased infiltrate on the right. ? ?Vital signs:  ?BP 99/68   Pulse 91   Temp (!) 96.3 ?F (35.7 ?C) (Axillary)   Resp 19   Ht 5\' 5"  (1.651 m)   Wt 94.7 kg   SpO2 96%   BMI 34.74 kg/m?  ? Intake/output:  ?I/O last 3 completed shifts: ?In: 3613.3 [I.V.:800; IV Piggyback:2813.3] ?Out: -  ?  ?Physical exam:  ? ?General - alert ?Eyes - pupils reactive ?ENT - no sinus tenderness, no stridor ?Cardiac - regular rate/rhythm, no murmur ?Chest - crackles at bases Rt > Lt ?Abdomen - soft, non tender, + bowel sounds ?Extremities - no cyanosis, clubbing, or edema ?Skin - no rashes ?Neuro - normal strength, moves extremities, follows commands ?Psych - normal mood and behavior ? ? Best practice:  ? ?DVT - SQ heparin ?SUP - Protonix ?Nutrition - D3  ? ?Assessment/plan:   ? ?Acute hypoxic respiratory failure from community acquired bacterial pneumonia. ?- goal SpO2 > 92%; doing well with heated high flow ?- continue Bipap qhs for now ?- f/u CXR intermittently ? ?Severe sepsis from pneumonia. ?- continue IV fluids ?- day 2 of Abx ?- f/u blood culture, urine legionella antigen ? ?AKI from ATN in setting of sepsis. ?Anion gap/non gap metabolic acidosis with lactic acidosis. ?Hypokalemia. ?- baseline creatinine 0.82 from 06/07/19 ?- f/u BMET ?- monitor urine outpt ? ?Elevated troponin likely from demand ischemia in setting of sepsis. ?- f/u Echo ? ?Back pain, neuropathy. ?- per primary team ? ?Deconditioning. ?- PT  ? ?Resolved hospital problems:  ? ? ?Goals of care/Family discussions:  ?Code status: Full ? ?Updated family at bedside ? ?Labs:  ? ?CMP Latest Ref Rng & Units 07/08/2021 06/13/2021 06/07/2019  ?Glucose 70 - 99 mg/dL 148(H) 183(H) 132(H)  ?BUN 8 - 23 mg/dL 39(H) 43(H) 17  ?Creatinine 0.61 - 1.24 mg/dL 1.66(H) 2.18(H) 0.82  ?Sodium 135 - 145 mmol/L 143 143 142  ?Potassium 3.5 - 5.1 mmol/L 4.0 3.4(L) 4.6  ?Chloride 98 - 111 mmol/L 114(H) 112(H) 111  ?CO2 22 - 32 mmol/L 15(L) 15(L) 24  ?Calcium 8.9 - 10.3 mg/dL 8.3(L) 8.1(L) 8.4(L)  ?Total Protein 6.5 - 8.1 g/dL 6.8 7.3 6.3(L)  ?  Total Bilirubin 0.3 - 1.2 mg/dL 0.7 0.9 0.7  ?Alkaline Phos 38 - 126 U/L 104 108 100  ?AST 15 - 41 U/L 30 24 54(H)  ?ALT 0 - 44 U/L 21 15 71(H)  ? ? ?CBC Latest Ref Rng & Units 07/08/2021 06/28/2021 06/07/2019  ?WBC 4.0 - 10.5 K/uL 18.8(H) 14.1(H) 13.6(H)  ?Hemoglobin 13.0 - 17.0 g/dL 11.7(L) 11.9(L) 13.0  ?Hematocrit 39.0 - 52.0 % 36.6(L) 37.4(L) 41.3  ?Platelets 150 - 400 K/uL 182 234 212  ? ? ?ABG ?   ?Component Value Date/Time  ? PHART 7.27 (L) 06/21/2021 0947  ? PCO2ART 31 (L) 06/30/2021 0947  ? PO2ART 59 (L) 07/04/2021 0947  ? HCO3 14.4 (L) 06/28/2021 0947  ? TCO2 25.9 03/01/2011 0429  ? ACIDBASEDEF 11.5 (H) 06/13/2021 0947  ? O2SAT 88.7 06/22/2021 0947  ? ? ?CBG (last 3)  ?Recent Labs  ?  07/08/21 ?0345  07/08/21 ?0720 07/08/21 ?1144  ?GLUCAP 143* 118* 152*  ? ?  ? ?Signature:  ?Chesley Mires, MD ?[place] ?Pager - [phone] - [phone] ?07/08/2021, 1:40 PM ? ? ? ? ? ? ? ?

## 2021-07-08 DEATH — deceased

## 2021-07-09 DIAGNOSIS — A419 Sepsis, unspecified organism: Secondary | ICD-10-CM | POA: Diagnosis not present

## 2021-07-09 DIAGNOSIS — N179 Acute kidney failure, unspecified: Secondary | ICD-10-CM | POA: Diagnosis not present

## 2021-07-09 DIAGNOSIS — D72829 Elevated white blood cell count, unspecified: Secondary | ICD-10-CM | POA: Diagnosis present

## 2021-07-09 DIAGNOSIS — J9601 Acute respiratory failure with hypoxia: Secondary | ICD-10-CM

## 2021-07-09 DIAGNOSIS — J189 Pneumonia, unspecified organism: Secondary | ICD-10-CM

## 2021-07-09 LAB — CBC WITH DIFFERENTIAL/PLATELET
Abs Immature Granulocytes: 0.42 10*3/uL — ABNORMAL HIGH (ref 0.00–0.07)
Basophils Absolute: 0.1 10*3/uL (ref 0.0–0.1)
Basophils Relative: 0 %
Eosinophils Absolute: 0 10*3/uL (ref 0.0–0.5)
Eosinophils Relative: 0 %
HCT: 36.3 % — ABNORMAL LOW (ref 39.0–52.0)
Hemoglobin: 11.1 g/dL — ABNORMAL LOW (ref 13.0–17.0)
Immature Granulocytes: 2 %
Lymphocytes Relative: 8 %
Lymphs Abs: 2.1 10*3/uL (ref 0.7–4.0)
MCH: 26.5 pg (ref 26.0–34.0)
MCHC: 30.6 g/dL (ref 30.0–36.0)
MCV: 86.6 fL (ref 80.0–100.0)
Monocytes Absolute: 1.7 10*3/uL — ABNORMAL HIGH (ref 0.1–1.0)
Monocytes Relative: 7 %
Neutro Abs: 21.1 10*3/uL — ABNORMAL HIGH (ref 1.7–7.7)
Neutrophils Relative %: 83 %
Platelets: 298 10*3/uL (ref 150–400)
RBC: 4.19 MIL/uL — ABNORMAL LOW (ref 4.22–5.81)
RDW: 15.2 % (ref 11.5–15.5)
WBC: 25.5 10*3/uL — ABNORMAL HIGH (ref 4.0–10.5)
nRBC: 0.5 % — ABNORMAL HIGH (ref 0.0–0.2)

## 2021-07-09 LAB — COMPREHENSIVE METABOLIC PANEL
ALT: 22 U/L (ref 0–44)
AST: 25 U/L (ref 15–41)
Albumin: 3.2 g/dL — ABNORMAL LOW (ref 3.5–5.0)
Alkaline Phosphatase: 113 U/L (ref 38–126)
Anion gap: 13 (ref 5–15)
BUN: 42 mg/dL — ABNORMAL HIGH (ref 8–23)
CO2: 19 mmol/L — ABNORMAL LOW (ref 22–32)
Calcium: 8.8 mg/dL — ABNORMAL LOW (ref 8.9–10.3)
Chloride: 115 mmol/L — ABNORMAL HIGH (ref 98–111)
Creatinine, Ser: 1.51 mg/dL — ABNORMAL HIGH (ref 0.61–1.24)
GFR, Estimated: 47 mL/min — ABNORMAL LOW (ref >60)
Glucose, Bld: 124 mg/dL — ABNORMAL HIGH (ref 70–99)
Potassium: 3.2 mmol/L — ABNORMAL LOW (ref 3.5–5.1)
Sodium: 147 mmol/L — ABNORMAL HIGH (ref 135–145)
Total Bilirubin: 0.1 mg/dL — ABNORMAL LOW (ref 0.3–1.2)
Total Protein: 7 g/dL (ref 6.5–8.1)

## 2021-07-09 LAB — PROCALCITONIN: Procalcitonin: 0.23 ng/mL

## 2021-07-09 LAB — LEGIONELLA PNEUMOPHILA SEROGP 1 UR AG: L. pneumophila Serogp 1 Ur Ag: NEGATIVE

## 2021-07-09 LAB — TECHNOLOGIST SMEAR REVIEW

## 2021-07-09 LAB — MAGNESIUM: Magnesium: 1.9 mg/dL (ref 1.7–2.4)

## 2021-07-09 MED ORDER — CHLORHEXIDINE GLUCONATE CLOTH 2 % EX PADS
6.0000 | MEDICATED_PAD | Freq: Every day | CUTANEOUS | Status: DC
  Administered 2021-07-09 – 2021-07-24 (×15): 6 via TOPICAL

## 2021-07-09 MED ORDER — LORAZEPAM 0.5 MG PO TABS
0.5000 mg | ORAL_TABLET | ORAL | Status: DC | PRN
  Administered 2021-07-09 – 2021-07-14 (×21): 0.5 mg via ORAL
  Filled 2021-07-09 (×21): qty 1

## 2021-07-09 MED ORDER — HYDROCODONE-ACETAMINOPHEN 5-325 MG PO TABS
1.0000 | ORAL_TABLET | Freq: Four times a day (QID) | ORAL | Status: DC | PRN
  Administered 2021-07-09: 1 via ORAL
  Administered 2021-07-10 – 2021-07-13 (×13): 2 via ORAL
  Administered 2021-07-13: 1 via ORAL
  Administered 2021-07-13 – 2021-07-14 (×2): 2 via ORAL
  Filled 2021-07-09 (×3): qty 2
  Filled 2021-07-09: qty 1
  Filled 2021-07-09 (×14): qty 2

## 2021-07-09 MED ORDER — POTASSIUM CHLORIDE 20 MEQ PO PACK
40.0000 meq | PACK | Freq: Once | ORAL | Status: AC
Start: 2021-07-09 — End: 2021-07-09
  Administered 2021-07-09: 40 meq via ORAL
  Filled 2021-07-09: qty 2

## 2021-07-09 NOTE — Assessment & Plan Note (Addendum)
-   WBC trending down.  ?- clinical improvement is reassuring.   ?

## 2021-07-09 NOTE — Progress Notes (Signed)
Patient very anxious ripping BIPAP mask off. Patient repositioned and nasal cannula placed back on patient at 15L. Oxygen saturation at this time is 93%. ?

## 2021-07-09 NOTE — Progress Notes (Signed)
Pt off BIPAP and back on HFNC ?

## 2021-07-09 NOTE — Progress Notes (Signed)
PROGRESS NOTE   Jason Holt.  FTD:[id number] DOB: Sep 09, 1945 DOA: 07/04/2021 PCP: [facility]   Chief Complaint  Patient presents with   Respiratory Distress   Level of care: Stepdown  Brief Admission History:   76 y.o. male with medical history significant of hypertension, depression, and GERD who presented to the ED with worsening shortness of breath over the last 2-3 days.  EMS was called to his home yesterday and he was noted to be saturating in the 80th percentile while on room air, but he declined coming to the ED.  EMS was called again to his home and he was saturating in the 70th percentile this time.  He was found in his chair at home and was sitting in some urine.  According to his daughter at bedside who is his caretaker he has not had much of a cough, fevers, or chills.  No chest pain, nausea, vomiting or sick contacts noted.  He apparently lives at home by himself and does not smoke.   In the ED, chest x-ray noted to have findings of right sided pneumonia as well as leukocytosis.  Due to his significant hypoxemia and tachypnea he was started on BiPAP to which she is now more comfortable.  His lactic acid was 3.2 and is elevating to 3.6 and he has only received 1 L IV fluid and has been started on azithromycin and Rocephin.  EKG was sinus rhythm 93 bpm and troponin elevated at 150.  BNP is 1210.  Creatinine is 2.18 with baseline 0.8-0.9.  Flu and COVID testing negative.  07/08/2021:  Pt tolerating nightly bipap and it seems to be helping.   07/09/2021: clinically improving. WBC bump.    Assessment and Plan: * Severe sepsis ([place]) Secondary to right-sided pneumonia-community-acquired Lactic acidosis has RESOLVED Procalcitonin is reassuring at 0.30  Continue Rocephin and azithromycin for now Monitor cultures Urine Legionella (pending) and strep pneumonia (negative) COVID and flu swab negative  Acute respiratory failure with hypoxia (HCC) Secondary to  pneumonia Continue on BiPAP for now and wean as tolerated Appreciate PCCM evaluation and plan is to continue nightly bipap for now  Leukocytosis - bump in WBC, recheck CBC/differential in AM - clinical improvement is reassuring.    Community acquired pneumonia Continue antibiotics as ordered.   Pressure injury of skin Pressure Injury 06/29/2021 Buttocks Left Stage 2 -  Partial thickness loss of dermis presenting as a shallow open injury with a red, pink wound bed without slough. (Active)  06/11/2021 1308  Location: Buttocks  Location Orientation: Left  Staging: Stage 2 -  Partial thickness loss of dermis presenting as a shallow open injury with a red, pink wound bed without slough.  Wound Description (Comments):   Present on Admission: Yes     Pressure Injury 06/19/2021 Buttocks Right Stage 2 -  Partial thickness loss of dermis presenting as a shallow open injury with a red, pink wound bed without slough. (Active)  06/16/2021 1309  Location: Buttocks  Location Orientation: Right  Staging: Stage 2 -  Partial thickness loss of dermis presenting as a shallow open injury with a red, pink wound bed without slough.  Wound Description (Comments):   Present on Admission: Yes    Elevated troponin Likely related to demand with sepsis physiology Continue to monitor 2D echocardiogram 07/08/21: 1. Left ventricular ejection fraction, by estimation, is 60 to 65%. The left ventricle has normal function. The left ventricle has no regional wall motion abnormalities. There is mild concentric left  ventricular hypertrophy. Left ventricular diastolic parameters are consistent with Grade II diastolic dysfunction (pseudonormalization). There is the interventricular septum is flattened in diastole ('D' shaped left ventricle), consistent with right ventricular volume overload.   2. Right ventricular systolic function is mildly reduced. The right ventricular size is dilated. There is moderately elevated pulmonary artery  systolic pressure. RV mid wall not well visualized.   3. The mitral valve is grossly normal. No evidence of mitral valve regurgitation. No evidence of mitral stenosis.   4. The aortic valve is tricuspid. Aortic valve regurgitation is not visualized. No aortic stenosis is present.   AKI (acute kidney injury) ([place]) Baseline creatinine 0.8-0.9 Likely related to severe dehydration Improving with hydration Avoid nephrotoxic agents Monitor strict I's and O's Continue IV fluid   Essential hypertension Hold home lisinopril given AKI Currently with softer blood pressure readings with sepsis physiology, monitor  GERD (gastroesophageal reflux disease) Continue on IV PPI until off BiPAP   DVT prophylaxis: [employer] heparin Code Status: full  Family Communication:  Disposition: Status is: Inpatient Remains inpatient appropriate because: IV antibiotics required  Consultants:  PCCM  Procedures:   Antimicrobials:    Subjective: No specific complaints today.  Objective: Vitals:   07/09/21 1249 07/09/21 1400 07/09/21 1500 07/09/21 1604  BP:  136/68 137/64   Pulse: 91 89 99   Resp: (!) 28 15 (!) 24   Temp:    (!) 97.5 F (36.4 C)  TempSrc:    Axillary  SpO2: 98% 97% 97%   Weight:      Height:        Intake/Output Summary (Last 24 hours) at 07/09/2021 1832 Last data filed at 07/09/2021 1831 Gross per 24 hour  Intake 384.46 ml  Output 2850 ml  Net -2465.54 ml   Filed Weights   06/18/2021 1248 07/08/21 0351 07/09/21 0446  Weight: 94.2 kg 94.7 kg 93.6 kg   Examination:  General exam: chronically ill appearing male, awake, alert, off bipap on [place], Appears calm and comfortable  Respiratory system: poor air movement, moderate increased work of breathing.  Cardiovascular system: normal S1 & S2 heard. No JVD, murmurs, rubs, gallops or clicks. No pedal edema. Gastrointestinal system: Abdomen is nondistended, soft and nontender. No organomegaly or masses felt. Normal bowel sounds heard. Central  nervous system: Alert and oriented. No focal neurological deficits. Extremities: Symmetric 5 x 5 power. Skin: No rashes, lesions or ulcers. Psychiatry: Judgement and insight appear poor. Mood & affect appropriate.   Data Reviewed: I have personally reviewed following labs and imaging studies  CBC: Recent Labs  Lab 06/12/2021 0950 07/08/21 0631 07/09/21 0356  WBC 14.1* 18.8* 25.5*  NEUTROABS 11.2*  --  21.1*  HGB 11.9* 11.7* 11.1*  HCT 37.4* 36.6* 36.3*  MCV 87.4 86.1 86.6  PLT 234 182 209    Basic Metabolic Panel: Recent Labs  Lab 06/23/2021 0950 07/08/21 0339 07/09/21 0356  NA 143 143 147*  K 3.4* 4.0 3.2*  CL 112* 114* 115*  CO2 15* 15* 19*  GLUCOSE 183* 148* 124*  BUN 43* 39* 42*  CREATININE 2.18* 1.66* 1.51*  CALCIUM 8.1* 8.3* 8.8*  MG  --  2.2 1.9    CBG: Recent Labs  Lab 06/29/2021 2304 07/08/21 0345 07/08/21 0720 07/08/21 1144 07/08/21 1550  GLUCAP 169* 143* 118* 152* 121*    Recent Results (from the past 240 hour(s))  Resp Panel by RT-PCR (Flu A&B, Covid) Nasopharyngeal Swab     Status: None   Collection Time: 06/26/2021  9:28 AM   Specimen: Nasopharyngeal Swab; Nasopharyngeal(NP) swabs in vial transport medium  Result Value Ref Range Status   SARS Coronavirus 2 by RT PCR NEGATIVE NEGATIVE Final    Comment: (NOTE) SARS-CoV-2 target nucleic acids are NOT DETECTED.  The SARS-CoV-2 RNA is generally detectable in upper respiratory specimens during the acute phase of infection. The lowest concentration of SARS-CoV-2 viral copies this assay can detect is 138 copies/mL. A negative result does not preclude SARS-Cov-2 infection and should not be used as the sole basis for treatment or other patient management decisions. A negative result may occur with  improper specimen collection/handling, submission of specimen other than nasopharyngeal swab, presence of viral mutation(s) within the areas targeted by this assay, and inadequate number of viral copies(<138  copies/mL). A negative result must be combined with clinical observations, patient history, and epidemiological information. The expected result is Negative.  Fact Sheet for Patients:  EntrepreneurPulse.com.au  Fact Sheet for Healthcare Providers:  IncredibleEmployment.be  This test is no t yet approved or cleared by the [place] FDA and  has been authorized for detection and/or diagnosis of SARS-CoV-2 by FDA under an Emergency Use Authorization (EUA). This EUA will remain  in effect (meaning this test can be used) for the duration of the COVID-19 declaration under Section 564(b)(1) of the Act, 21 U.S.C.section 360bbb-3(b)(1), unless the authorization is terminated  or revoked sooner.       Influenza A by PCR NEGATIVE NEGATIVE Final   Influenza B by PCR NEGATIVE NEGATIVE Final    Comment: (NOTE) The Xpert Xpress SARS-CoV-2/FLU/RSV plus assay is intended as an aid in the diagnosis of influenza from Nasopharyngeal swab specimens and should not be used as a sole basis for treatment. Nasal washings and aspirates are unacceptable for Xpert Xpress SARS-CoV-2/FLU/RSV testing.  Fact Sheet for Patients: EntrepreneurPulse.com.au  Fact Sheet for Healthcare Providers: IncredibleEmployment.be  This test is not yet approved or cleared by the [place] FDA and has been authorized for detection and/or diagnosis of SARS-CoV-2 by FDA under an Emergency Use Authorization (EUA). This EUA will remain in effect (meaning this test can be used) for the duration of the COVID-19 declaration under Section 564(b)(1) of the Act, 21 U.S.C. section 360bbb-3(b)(1), unless the authorization is terminated or revoked.  Performed at [facility], [address], [place], [place] [address]   Blood Culture (routine x 2)     Status: None (Preliminary result)   Collection Time: 06/18/2021  9:31 AM   Specimen: BLOOD  Result Value Ref  Range Status   Specimen Description BLOOD BLOOD LEFT ARM  Final   Special Requests   Final    BOTTLES DRAWN AEROBIC AND ANAEROBIC Blood Culture adequate volume   Culture   Final    NO GROWTH 2 DAYS Performed at [facility], [address], [place], [place] [address]    Report Status PENDING  Incomplete  Blood Culture (routine x 2)     Status: None (Preliminary result)   Collection Time: 06/13/2021  9:51 AM   Specimen: BLOOD  Result Value Ref Range Status   Specimen Description BLOOD RIGHT ANTECUBITAL  Final   Special Requests   Final    BOTTLES DRAWN AEROBIC AND ANAEROBIC Blood Culture adequate volume   Culture   Final    NO GROWTH 2 DAYS Performed at [facility], [address], [place], [place] [address]    Report Status PENDING  Incomplete  MRSA Next Gen by PCR, Nasal     Status:  None   Collection Time: 06/26/2021  1:11 PM   Specimen: Nasal Mucosa; Nasal Swab  Result Value Ref Range Status   MRSA by PCR Next Gen NOT DETECTED NOT DETECTED Final    Comment: (NOTE) The GeneXpert MRSA Assay (FDA approved for NASAL specimens only), is one component of a comprehensive MRSA colonization surveillance program. It is not intended to diagnose MRSA infection nor to guide or monitor treatment for MRSA infections. Test performance is not FDA approved in patients less than 65 years old. Performed at [facility], [address] [place], [place] [address]      Radiology Studies: [facility] 1 View  Result Date: 07/08/2021 CLINICAL DATA:  Community-acquired pneumonia.  Dyspnea. EXAM: PORTABLE CHEST 1 VIEW COMPARISON:  06/15/2021 FINDINGS: The cardiac silhouette remains enlarged. Lung volumes remain low. Right perihilar/infrahilar airspace opacities on the prior study have improved with mild residual opacity remaining. Mild left basilar opacity likely reflects atelectasis or scarring. No sizable pleural effusion or pneumothorax is identified. No acute osseous abnormality is seen. IMPRESSION:  Decreasing right perihilar/infrahilar opacity suggestive of improving pneumonia. Electronically Signed   By: Logan Bores M.D.   On: 07/08/2021 08:09   ECHOCARDIOGRAM COMPLETE  Result Date: 07/08/2021    ECHOCARDIOGRAM REPORT   Patient Name:   Gabrial Domine. Date of Exam: 07/08/2021 Medical Rec #:  [id number]               Height:       65.0 in Accession #:    [id number]              Weight:       208.8 lb Date of Birth:  21-May-1945               BSA:          2.015 m Patient Age:    31 years                BP:           170/62 mmHg Patient Gender: M                       HR:           93 bpm. Exam Location:  [facility] Procedure: 2D Echo, Cardiac Doppler, Color Doppler and Intracardiac            Opacification Agent Indications:    Elevated Troponin  History:        Patient has prior history of Echocardiogram examinations, most                 recent 02/18/2011. Risk Factors:Hypertension. Sepsis.  Sonographer:    Wenda Low Referring Phys: [id number] [place] [facility]  Sonographer Comments: Patient is morbidly obese. IMPRESSIONS  1. Left ventricular ejection fraction, by estimation, is 60 to 65%. The left ventricle has normal function. The left ventricle has no regional wall motion abnormalities. There is mild concentric left ventricular hypertrophy. Left ventricular diastolic parameters are consistent with Grade II diastolic dysfunction (pseudonormalization). There is the interventricular septum is flattened in diastole ('D' shaped left ventricle), consistent with right ventricular volume overload.  2. Right ventricular systolic function is mildly reduced. The right ventricular size is dilated. There is moderately elevated pulmonary artery systolic pressure. RV mid wall not well visualized.  3. The mitral valve is grossly normal. No evidence of mitral valve regurgitation. No evidence of mitral stenosis.  4. The aortic valve  is tricuspid. Aortic valve regurgitation is not visualized. No aortic stenosis is  present. Comparison(s): Unable to see 2012 study. Conclusion(s)/Recommendation(s): RV dysfunction and dilation (unclear if new, no true McConnells sign) in the setting of elevated troponin, PE is in differential. FINDINGS  Left Ventricle: Left ventricular ejection fraction, by estimation, is 60 to 65%. The left ventricle has normal function. The left ventricle has no regional wall motion abnormalities. Definity contrast agent was given IV to delineate the left ventricular  endocardial borders. The left ventricular internal cavity size was normal in size. There is mild concentric left ventricular hypertrophy. The interventricular septum is flattened in diastole ('D' shaped left ventricle), consistent with right ventricular  volume overload. Left ventricular diastolic parameters are consistent with Grade II diastolic dysfunction (pseudonormalization). Right Ventricle: RVOT is dilated. The right ventricular size is moderately enlarged. No increase in right ventricular wall thickness. Right ventricular systolic function is mildly reduced. There is moderately elevated pulmonary artery systolic pressure. The tricuspid regurgitant velocity is 3.11 m/s, and with an assumed right atrial pressure of 15 mmHg, the estimated right ventricular systolic pressure is 42.7 mmHg. Left Atrium: Left atrial size was normal in size. Right Atrium: Right atrial size was normal in size. Pericardium: Trivial pericardial effusion is present. Presence of epicardial fat layer. Mitral Valve: The mitral valve is grossly normal. No evidence of mitral valve regurgitation. No evidence of mitral valve stenosis. MV peak gradient, 6.4 mmHg. The mean mitral valve gradient is 2.0 mmHg. Tricuspid Valve: The tricuspid valve is normal in structure. Tricuspid valve regurgitation is mild. Aortic Valve: The aortic valve is tricuspid. There is mild aortic valve annular calcification. Aortic valve regurgitation is not visualized. No aortic stenosis is present.  Aortic valve mean gradient measures 2.0 mmHg. Aortic valve peak gradient measures 4.5 mmHg. Aortic valve area, by VTI measures 2.65 cm. Pulmonic Valve: The pulmonic valve was normal in structure. Pulmonic valve regurgitation is mild. No evidence of pulmonic stenosis. Aorta: The aortic root and ascending aorta are structurally normal, with no evidence of dilitation. IAS/Shunts: No atrial level shunt detected by color flow Doppler.  LEFT VENTRICLE PLAX 2D LVIDd:         4.00 cm     Diastology LVIDs:         2.50 cm     LV e' medial:    4.24 cm/s LV PW:         1.00 cm     LV E/e' medial:  16.2 LV IVS:        1.30 cm     LV e' lateral:   5.44 cm/s LVOT diam:     2.00 cm     LV E/e' lateral: 12.6 LV SV:         46 LV SV Index:   23 LVOT Area:     3.14 cm  LV Volumes (MOD) LV vol d, MOD A2C: 36.8 ml LV vol d, MOD A4C: 30.7 ml LV vol s, MOD A2C: 16.6 ml LV vol s, MOD A4C: 12.1 ml LV SV MOD A2C:     20.2 ml LV SV MOD A4C:     30.7 ml LV SV MOD BP:      19.3 ml RIGHT VENTRICLE RV Basal diam:  4.50 cm RV Mid diam:    4.00 cm RV S prime:     9.90 cm/s TAPSE (M-mode): 1.3 cm LEFT ATRIUM             Index  RIGHT ATRIUM           Index LA diam:        4.10 cm 2.04 cm/m   RA Area:     19.30 cm LA Vol (A2C):   45.1 ml 22.39 ml/m  RA Volume:   62.50 ml  31.02 ml/m LA Vol (A4C):   47.2 ml 23.43 ml/m LA Biplane Vol: 48.7 ml 24.17 ml/m  AORTIC VALVE                    PULMONIC VALVE AV Area (Vmax):    2.59 cm     PV Vmax:       0.54 m/s AV Area (Vmean):   2.57 cm     PV Peak grad:  1.2 mmHg AV Area (VTI):     2.65 cm AV Vmax:           106.00 cm/s AV Vmean:          59.300 cm/s AV VTI:            0.172 m AV Peak Grad:      4.5 mmHg AV Mean Grad:      2.0 mmHg LVOT Vmax:         87.50 cm/s LVOT Vmean:        48.500 cm/s LVOT VTI:          0.145 m LVOT/AV VTI ratio: 0.84  AORTA Ao Root diam: 2.80 cm Ao Asc diam:  3.30 cm MITRAL VALVE                TRICUSPID VALVE MV Area (PHT): 6.02 cm     TR Peak grad:   38.7 mmHg MV  Area VTI:   2.32 cm     TR Vmax:        311.00 cm/s MV Peak grad:  6.4 mmHg MV Mean grad:  2.0 mmHg     SHUNTS MV Vmax:       1.26 m/s     Systemic VTI:  0.14 m MV Vmean:      63.8 cm/s    Systemic Diam: 2.00 cm MV Decel Time: 126 msec MV E velocity: 68.60 cm/s MV A velocity: 114.00 cm/s MV E/A ratio:  0.60 Rudean Haskell MD Electronically signed by Rudean Haskell MD Signature Date/Time: 07/08/2021/3:49:54 PM    Final     Scheduled Meds:  Chlorhexidine Gluconate Cloth  6 each Topical Q0600   gabapentin  200 mg Oral TID   heparin  5,000 Units Subcutaneous Q8H   metoprolol tartrate  25 mg Oral BID   pantoprazole  40 mg Oral Daily   Continuous Infusions:  azithromycin 500 mg (07/09/21 0911)   cefTRIAXone (ROCEPHIN)  IV 2 g (07/09/21 0823)   lactated ringers 10 mL/hr at 07/09/21 1357     LOS: 2 days   Time spent: 35 mins  Eiman Maret Wynetta Emery, MD How to contact the [facility] Attending or Consulting provider [place] or covering provider during after hours 7P -7A, for this patient?  Check the care team in [facility] and look for a) attending/consulting [facility] provider listed and b) the [facility] team listed Log into [email or web address] and use [place]'s universal password to access. If you do not have the password, please contact the hospital operator. Locate the [facility] provider you are looking for under [facility] and page to a number that you can be directly reached. If you still have difficulty reaching the provider, please page the [facility] (  Director on Call) for the Hospitalists listed on amion for assistance.  07/09/2021, 6:32 PM

## 2021-07-09 NOTE — Plan of Care (Signed)
?  Problem: Acute Rehab PT Goals(only PT should resolve) ?Goal: Pt Will Go Supine/Side To Sit ?Outcome: Progressing ?Flowsheets (Taken 07/09/2021 1357) ?Pt will go Supine/Side to Sit: ? with supervision ? with modified independence ?Goal: Patient Will Transfer Sit To/From Stand ?Outcome: Progressing ?Flowsheets (Taken 07/09/2021 1357) ?Patient will transfer sit to/from stand: with supervision ?Goal: Pt Will Transfer Bed To Chair/Chair To Bed ?Outcome: Progressing ?Flowsheets (Taken 07/09/2021 1357) ?Pt will Transfer Bed to Chair/Chair to Bed: with supervision ?Goal: Pt Will Ambulate ?Outcome: Progressing ?Flowsheets (Taken 07/09/2021 1357) ?Pt will Ambulate: ? 50 feet ? with min guard assist ? with minimal assist ? with rolling walker ?  ?1:58 PM, 07/09/21 ?Lonell Grandchild, MPT ?Physical Therapist with [place] ?[facility] ?[phone] office ?[phone] mobile phone ? ?

## 2021-07-09 NOTE — Assessment & Plan Note (Signed)
Continue antibiotics as ordered.  ?

## 2021-07-09 NOTE — Progress Notes (Signed)
[facility] ? ? ?Patient name: Jason Holt. Admit date: 06/16/2021  ?DOB: Aug 29, 1945 LOS: 2  ?MRN: [id number] Consult date: 06/14/2021  ?Referring provider: Dr. Manuella Ghazi, [facility]: short of breath  ? ? ?History:  ?76 yo male presented to [facility] with dyspnea for 3 days.  EMS called on 2/27 and he had SpO2 in the 80's on room air.  He declined option to come to ER then.  Symptoms got worse and he came to ER.  SpO2 in 70's on room air.  Found to have Rt sided pneumonia.  Started on IV fluids and antibiotics for sepsis, and Bipap with oxygen for respiratory failure.  PCCM consulted to assist with management in ICU. ? ?Past medical history:  ?HTN, Depression, GERD ? ?Significant events:  ?2/28 Admit ? ?Studies:  ?Echo 3/01 >> EF 60 to 65%, mild LVH, grade 2 DD, mod elevation in PASP ? ?Micro:  ?COVID/Flu 2/28 >> negative ?Blood 2/28 >>  ?Legionella 2/28 >> negative ?Pneumococcal 2/28 >> negative ? Lines:  ? ?  ?Antibiotics:  ?Rocephin 2/28 >> ?Zithromax 2/28 >>  ? Consults:  ? ?  ? ?Interim history:  ?Had O2 titrated down with PT this AM.  Later SpO2 dropped and he started to get agitated and hallucinate.  Put back in bed and restarted Bipap.  Improved since.  He doesn't like wearing Bipap. ? ?Vital signs:  ?BP 127/66   Pulse 91   Temp (!) 96.5 ?F (35.8 ?C) (Axillary)   Resp (!) 28   Ht 5\' 5"  (1.651 m)   Wt 93.6 kg   SpO2 98%   BMI 34.34 kg/m?  ? Intake/output:  ?I/O last 3 completed shifts: ?In: 970.3 [I.V.:620.3; IV Piggyback:350] ?Out: 2850 [Urine:2850] ?  ?Physical exam:  ? ?General - alert ?Eyes - pupils reactive ?ENT - wearing Bipap ?Cardiac - regular rate/rhythm, no murmur ?Chest - equal breath sounds b/l, no wheezing or rales ?Abdomen - soft, non tender, + bowel sounds ?Extremities - no cyanosis, clubbing, or edema ?Skin - no rashes ?Neuro - normal strength, moves extremities, follows commands ? Best practice:  ? ?DVT - SQ heparin ?SUP -  Protonix ?Nutrition - D3  ? ?Assessment/plan:  ? ?Acute hypoxic respiratory failure from community acquired bacterial pneumonia. ?- goal SpO2 > 92% ?- Bipap qhs and prn ?- follow up CXR next week  ? ?Severe sepsis from pneumonia. ?- day 3 of Abx ? ?AKI from ATN in setting of sepsis. ?Anion gap/non gap metabolic acidosis with lactic acidosis. ?Hypokalemia. ?Mild hypernatremia from diuresis. ?- baseline creatinine 0.82 from 06/07/19 ?- monitor urine outpt, f/u BMET ?- hold additional lasix for now ? ?Leukocytosis. ?- increased WBC on 3/02 likely from hemoconcentration after diuresis ? ?Chronic diastolic CHF. ?- monitor hemodynamics ? ?Back pain, neuropathy. ?- per primary team ? ?Deconditioning. ?- PT  ? ?Resolved hospital problems:  ?Elevated troponin from demand ischemia in setting of sepsis ? ?Goals of care/Family discussions:  ?Code status: Full ? ?Updated family at bedside ? ?Labs:  ? ?CMP Latest Ref Rng & Units 07/09/2021 07/08/2021 06/16/2021  ?Glucose 70 - 99 mg/dL 124(H) 148(H) 183(H)  ?BUN 8 - 23 mg/dL 42(H) 39(H) 43(H)  ?Creatinine 0.61 - 1.24 mg/dL 1.51(H) 1.66(H) 2.18(H)  ?Sodium 135 - 145 mmol/L 147(H) 143 143  ?Potassium 3.5 - 5.1 mmol/L 3.2(L) 4.0 3.4(L)  ?Chloride 98 - 111 mmol/L 115(H) 114(H) 112(H)  ?CO2 22 - 32 mmol/L 19(L) 15(L) 15(L)  ?Calcium 8.9 - 10.3 mg/dL  8.8(L) 8.3(L) 8.1(L)  ?Total Protein 6.5 - 8.1 g/dL 7.0 6.8 7.3  ?Total Bilirubin 0.3 - 1.2 mg/dL 0.1(L) 0.7 0.9  ?Alkaline Phos 38 - 126 U/L 113 104 108  ?AST 15 - 41 U/L 25 30 24   ?ALT 0 - 44 U/L 22 21 15   ? ? ?CBC Latest Ref Rng & Units 07/09/2021 07/08/2021 06/15/2021  ?WBC 4.0 - 10.5 K/uL 25.5(H) 18.8(H) 14.1(H)  ?Hemoglobin 13.0 - 17.0 g/dL 11.1(L) 11.7(L) 11.9(L)  ?Hematocrit 39.0 - 52.0 % 36.3(L) 36.6(L) 37.4(L)  ?Platelets 150 - 400 K/uL 298 182 234  ? ? ?ABG ?   ?Component Value Date/Time  ? PHART 7.27 (L) 06/21/2021 0947  ? PCO2ART 31 (L) 06/12/2021 0947  ? PO2ART 59 (L) 06/25/2021 0947  ? HCO3 14.4 (L) 06/24/2021 0947  ? TCO2 25.9  03/01/2011 0429  ? ACIDBASEDEF 11.5 (H) 06/11/2021 0947  ? O2SAT 88.7 06/30/2021 0947  ? ? ?CBG (last 3)  ?Recent Labs  ?  07/08/21 ?0720 07/08/21 ?1144 07/08/21 ?1550  ?GLUCAP 118* 152* 121*  ? ?  ? ?Signature:  ?Chesley Mires, MD ?[place] ?Pager - [phone] - [phone] ?07/09/2021, 1:48 PM ? ? ? ? ? ? ? ?

## 2021-07-09 NOTE — Plan of Care (Signed)

## 2021-07-09 NOTE — Evaluation (Signed)
Physical Therapy Evaluation ?Patient Details ?Name: Jason Holt. ?MRN: [id number] ?DOB: 1945-08-20 ?Today's Date: 07/09/2021 ? ?History of Present Illness ? Dreshon Proffit. is a 76 y.o. male with medical history significant of hypertension, depression, and GERD who presented to the ED with worsening shortness of breath over the last 2-3 days.  EMS was called to his home yesterday and he was noted to be saturating in the 80th percentile while on room air, but he declined coming to the ED.  EMS was called again to his home and he was saturating in the 70th percentile this time.  He was found in his chair at home and was sitting in some urine.  According to his daughter at bedside who is his caretaker he has not had much of a cough, fevers, or chills.  No chest pain, nausea, vomiting or sick contacts noted.  He apparently lives at home by himself and does not smoke. ?  ?Clinical Impression ? Patient anxious and requires encouragement to participate with therapy.  Patient demonstrates slow labored movement for sitting up at bedside, once seated c/o dizziness that decreased, but lingered throughout visit, BP WNL requiring frequent rest breaks.  Patient able to take a few steps at bedside with Min hand held assist and leaning on armrest of chair for support, unable to walk away from bedside due to c/o fatigue and generalized weakness.  Patient tolerated staying up in chair with his daughter present in room and left on 5 LPM O2 with SpO2 at 88-92% - RN notified.  Patient will benefit from continued skilled physical therapy in hospital and recommended venue below to increase strength, balance, endurance for safe ADLs and gait.  ?   ?   ? ?Recommendations for follow up therapy are one component of a multi-disciplinary discharge planning process, led by the attending physician.  Recommendations may be updated based on patient status, additional functional criteria and insurance authorization. ? ?Follow Up  Recommendations Home health PT ? ?  ?Assistance Recommended at Discharge Intermittent Supervision/Assistance  ?Patient can return home with the following ? A little help with walking and/or transfers;A little help with bathing/dressing/bathroom;Help with stairs or ramp for entrance;Assistance with cooking/housework ? ?  ?Equipment Recommendations None recommended by PT  ?Recommendations for Other Services ?    ?  ?Functional Status Assessment Patient has had a recent decline in their functional status and demonstrates the ability to make significant improvements in function in a reasonable and predictable amount of time.  ? ?  ?Precautions / Restrictions Precautions ?Precautions: Fall ?Restrictions ?Weight Bearing Restrictions: No  ? ?  ? ?Mobility ? Bed Mobility ?Overal bed mobility: Needs Assistance ?Bed Mobility: Supine to Sit ?  ?  ?Supine to sit: Min assist ?  ?  ?General bed mobility comments: increased time, labored movement ?  ? ?Transfers ?Overall transfer level: Needs assistance ?Equipment used: 1 person hand held assist ?Transfers: Sit to/from Stand, Bed to chair/wheelchair/BSC ?Sit to Stand: Min assist ?  ?Step pivot transfers: Min assist ?  ?  ?  ?General transfer comment: slow labored movement having to lean on armrest of chair ?  ? ?Ambulation/Gait ?Ambulation/Gait assistance: Min assist, Mod assist ?Gait Distance (Feet): 4 Feet ?Assistive device: 1 person hand held assist ?Gait Pattern/deviations: Decreased step length - right, Decreased step length - left, Decreased stride length ?Gait velocity: decreased ?  ?  ?General Gait Details: limited to a few steps at bedside mostly due to c/o dizziness and fatigue while  on 15 LPM ? ?Stairs ?  ?  ?  ?  ?  ? ?Wheelchair Mobility ?  ? ?Modified Rankin (Stroke Patients Only) ?  ? ?  ? ?Balance Overall balance assessment: Needs assistance ?Sitting-balance support: Feet supported, No upper extremity supported ?Sitting balance-Leahy Scale: Fair ?Sitting balance -  Comments: fair/good seated at EOB ?  ?Standing balance support: During functional activity, No upper extremity supported ?Standing balance-Leahy Scale: Poor ?Standing balance comment: fair/poor with hand held assist ?  ?  ?  ?  ?  ?  ?  ?  ?  ?  ?  ?   ? ? ? ?Pertinent Vitals/Pain Pain Assessment ?Pain Assessment: No/denies pain  ? ? ?Home Living Family/patient expects to be discharged to:: Private residence ?Living Arrangements: Alone ?Available Help at Discharge: Family;Available PRN/intermittently ?Type of Home: House ?Home Access: Ramped entrance ?  ?  ?  ?Home Layout: One level ?Home Equipment: Conservation officer, nature (2 wheels);Cane - single point;Grab bars - tub/shower ?   ?  ?Prior Function Prior Level of Function : Independent/Modified Independent ?  ?  ?  ?  ?  ?  ?Mobility Comments: Community ambulator without AD, drives ?ADLs Comments: Independent ?  ? ? ?Hand Dominance  ?   ? ?  ?Extremity/Trunk Assessment  ? Upper Extremity Assessment ?Upper Extremity Assessment: Generalized weakness ?  ? ?Lower Extremity Assessment ?Lower Extremity Assessment: Generalized weakness ?  ? ?Cervical / Trunk Assessment ?Cervical / Trunk Assessment: Normal  ?Communication  ? Communication: No difficulties  ?Cognition Arousal/Alertness: Awake/alert ?Behavior During Therapy: [facility] for tasks assessed/performed ?Overall Cognitive Status: Within Functional Limits for tasks assessed ?  ?  ?  ?  ?  ?  ?  ?  ?  ?  ?  ?  ?  ?  ?  ?  ?  ?  ?  ? ?  ?General Comments   ? ?  ?Exercises    ? ?Assessment/Plan  ?  ?PT Assessment Patient needs continued PT services  ?PT Problem List Decreased strength;Decreased activity tolerance;Decreased balance;Decreased mobility ? ?   ?  ?PT Treatment Interventions DME instruction;Gait training;Stair training;Functional mobility training;Therapeutic activities;Therapeutic exercise;Patient/family education;Balance training   ? ?PT Goals (Current goals can be found in the Care Plan section)  ?Acute Rehab PT  Goals ?Patient Stated Goal: return home with family to assist ?PT Goal Formulation: With patient/family ?Time For Goal Achievement: 07/16/21 ?Potential to Achieve Goals: Good ? ?  ?Frequency Min 3X/week ?  ? ? ?Co-evaluation   ?  ?  ?  ?  ? ? ?  ?AM-PAC PT "6 Clicks" Mobility  ?Outcome Measure Help needed turning from your back to your side while in a flat bed without using bedrails?: None ?Help needed moving from lying on your back to sitting on the side of a flat bed without using bedrails?: A Little ?Help needed moving to and from a bed to a chair (including a wheelchair)?: A Little ?Help needed standing up from a chair using your arms (e.g., wheelchair or bedside chair)?: A Little ?Help needed to walk in hospital room?: A Lot ?Help needed climbing 3-5 steps with a railing? : A Lot ?6 Click Score: 17 ? ?  ?End of Session Equipment Utilized During Treatment: Oxygen ?Activity Tolerance: Patient tolerated treatment well ?Patient left: in chair;with call bell/phone within reach;with family/visitor present ?Nurse Communication: Mobility status ?PT Visit Diagnosis: Unsteadiness on feet (R26.81);Other abnormalities of gait and mobility (R26.89);Muscle weakness (generalized) (M62.81) ?  ? ?  Time: 0375-4360 ?PT Time Calculation (min) (ACUTE ONLY): 22 min ? ? ?Charges:   PT Evaluation ?$PT Eval Moderate Complexity: 1 Mod ?PT Treatments ?$Therapeutic Activity: 8-22 mins ?  ?   ? ? ?1:56 PM, 07/09/21 ?Lonell Grandchild, MPT ?Physical Therapist with [facility] ?[facility] ?[phone] office ?[phone] mobile phone ? ? ?

## 2021-07-10 ENCOUNTER — Inpatient Hospital Stay (HOSPITAL_COMMUNITY): Payer: No Typology Code available for payment source

## 2021-07-10 ENCOUNTER — Encounter (HOSPITAL_COMMUNITY): Payer: Self-pay | Admitting: Internal Medicine

## 2021-07-10 ENCOUNTER — Other Ambulatory Visit (HOSPITAL_COMMUNITY): Payer: Self-pay

## 2021-07-10 DIAGNOSIS — I2699 Other pulmonary embolism without acute cor pulmonale: Secondary | ICD-10-CM | POA: Diagnosis not present

## 2021-07-10 DIAGNOSIS — N179 Acute kidney failure, unspecified: Secondary | ICD-10-CM | POA: Diagnosis not present

## 2021-07-10 DIAGNOSIS — I2602 Saddle embolus of pulmonary artery with acute cor pulmonale: Secondary | ICD-10-CM

## 2021-07-10 DIAGNOSIS — I10 Essential (primary) hypertension: Secondary | ICD-10-CM

## 2021-07-10 DIAGNOSIS — J189 Pneumonia, unspecified organism: Secondary | ICD-10-CM | POA: Diagnosis not present

## 2021-07-10 DIAGNOSIS — R778 Other specified abnormalities of plasma proteins: Secondary | ICD-10-CM | POA: Diagnosis not present

## 2021-07-10 DIAGNOSIS — J9601 Acute respiratory failure with hypoxia: Secondary | ICD-10-CM | POA: Diagnosis not present

## 2021-07-10 LAB — COMPREHENSIVE METABOLIC PANEL
ALT: 23 U/L (ref 0–44)
AST: 21 U/L (ref 15–41)
Albumin: 3 g/dL — ABNORMAL LOW (ref 3.5–5.0)
Alkaline Phosphatase: 123 U/L (ref 38–126)
Anion gap: 11 (ref 5–15)
BUN: 37 mg/dL — ABNORMAL HIGH (ref 8–23)
CO2: 23 mmol/L (ref 22–32)
Calcium: 8.3 mg/dL — ABNORMAL LOW (ref 8.9–10.3)
Chloride: 117 mmol/L — ABNORMAL HIGH (ref 98–111)
Creatinine, Ser: 1.33 mg/dL — ABNORMAL HIGH (ref 0.61–1.24)
GFR, Estimated: 55 mL/min — ABNORMAL LOW (ref >60)
Glucose, Bld: 142 mg/dL — ABNORMAL HIGH (ref 70–99)
Potassium: 3.7 mmol/L (ref 3.5–5.1)
Sodium: 151 mmol/L — ABNORMAL HIGH (ref 135–145)
Total Bilirubin: 0.1 mg/dL — ABNORMAL LOW (ref 0.3–1.2)
Total Protein: 6.6 g/dL (ref 6.5–8.1)

## 2021-07-10 LAB — PROTIME-INR
INR: 1.1 (ref 0.8–1.2)
Prothrombin Time: 14.4 seconds (ref 11.4–15.2)

## 2021-07-10 LAB — CBC WITH DIFFERENTIAL/PLATELET
Abs Immature Granulocytes: 0.28 10*3/uL — ABNORMAL HIGH (ref 0.00–0.07)
Basophils Absolute: 0.1 10*3/uL (ref 0.0–0.1)
Basophils Relative: 1 %
Eosinophils Absolute: 0.1 10*3/uL (ref 0.0–0.5)
Eosinophils Relative: 1 %
HCT: 34.8 % — ABNORMAL LOW (ref 39.0–52.0)
Hemoglobin: 10.8 g/dL — ABNORMAL LOW (ref 13.0–17.0)
Immature Granulocytes: 2 %
Lymphocytes Relative: 9 %
Lymphs Abs: 1.3 10*3/uL (ref 0.7–4.0)
MCH: 27.4 pg (ref 26.0–34.0)
MCHC: 31 g/dL (ref 30.0–36.0)
MCV: 88.3 fL (ref 80.0–100.0)
Monocytes Absolute: 1 10*3/uL (ref 0.1–1.0)
Monocytes Relative: 7 %
Neutro Abs: 11.7 10*3/uL — ABNORMAL HIGH (ref 1.7–7.7)
Neutrophils Relative %: 80 %
Platelets: 276 10*3/uL (ref 150–400)
RBC: 3.94 MIL/uL — ABNORMAL LOW (ref 4.22–5.81)
RDW: 15.6 % — ABNORMAL HIGH (ref 11.5–15.5)
WBC: 14.5 10*3/uL — ABNORMAL HIGH (ref 4.0–10.5)
nRBC: 0.6 % — ABNORMAL HIGH (ref 0.0–0.2)

## 2021-07-10 LAB — BRAIN NATRIURETIC PEPTIDE: B Natriuretic Peptide: 1718 pg/mL — ABNORMAL HIGH (ref 0.0–100.0)

## 2021-07-10 LAB — HEPARIN LEVEL (UNFRACTIONATED): Heparin Unfractionated: 0.6 IU/mL (ref 0.30–0.70)

## 2021-07-10 LAB — PROCALCITONIN: Procalcitonin: 0.18 ng/mL

## 2021-07-10 LAB — D-DIMER, QUANTITATIVE: D-Dimer, Quant: 7.34 ug/mL-FEU — ABNORMAL HIGH (ref 0.00–0.50)

## 2021-07-10 LAB — APTT: aPTT: 33 seconds (ref 24–36)

## 2021-07-10 LAB — MAGNESIUM: Magnesium: 1.9 mg/dL (ref 1.7–2.4)

## 2021-07-10 MED ORDER — HEPARIN BOLUS VIA INFUSION
5000.0000 [IU] | Freq: Once | INTRAVENOUS | Status: AC
  Administered 2021-07-10: 5000 [IU] via INTRAVENOUS
  Filled 2021-07-10: qty 5000

## 2021-07-10 MED ORDER — DEXTROSE 5 % IV SOLN: INTRAVENOUS | Status: AC

## 2021-07-10 MED ORDER — IOHEXOL 350 MG/ML SOLN
80.0000 mL | Freq: Once | INTRAVENOUS | Status: AC | PRN
  Administered 2021-07-10: 80 mL via INTRAVENOUS

## 2021-07-10 MED ORDER — HEPARIN (PORCINE) 25000 UT/250ML-% IV SOLN
1500.0000 [IU]/h | INTRAVENOUS | Status: DC
  Administered 2021-07-10 – 2021-07-11 (×2): 1350 [IU]/h via INTRAVENOUS
  Administered 2021-07-12: 1250 [IU]/h via INTRAVENOUS
  Administered 2021-07-13: 1200 [IU]/h via INTRAVENOUS
  Administered 2021-07-13: 1400 [IU]/h via INTRAVENOUS
  Administered 2021-07-14 – 2021-07-16 (×4): 1600 [IU]/h via INTRAVENOUS
  Administered 2021-07-17: 1750 [IU]/h via INTRAVENOUS
  Administered 2021-07-17: 1700 [IU]/h via INTRAVENOUS
  Administered 2021-07-17: 1600 [IU]/h via INTRAVENOUS
  Administered 2021-07-18: 1750 [IU]/h via INTRAVENOUS
  Administered 2021-07-19: 23:00:00 1700 [IU]/h via INTRAVENOUS
  Administered 2021-07-19: 07:00:00 1750 [IU]/h via INTRAVENOUS
  Administered 2021-07-20: 15:00:00 1600 [IU]/h via INTRAVENOUS
  Administered 2021-07-21: 1550 [IU]/h via INTRAVENOUS
  Administered 2021-07-22: 1500 [IU]/h via INTRAVENOUS
  Administered 2021-07-22: 1550 [IU]/h via INTRAVENOUS
  Administered 2021-07-23 – 2021-07-24 (×2): 1500 [IU]/h via INTRAVENOUS
  Filled 2021-07-10 (×20): qty 250

## 2021-07-10 NOTE — Assessment & Plan Note (Signed)
Stable

## 2021-07-10 NOTE — Progress Notes (Signed)
ANTICOAGULATION CONSULT NOTE - Initial Consult ? ?Pharmacy Consult for heparin ?Indication: pulmonary embolus ? ?Allergies  ?Allergen Reactions  ? Eggs Or Egg-Derived Products Hives  ? Influenza Virus Vacc Split Pf Hives  ? Dye Fdc Red [Red Dye]   ? ? ?Patient Measurements: ?Height: 5\' 5"  (165.1 cm) ?Weight: 95.1 kg (209 lb 10.5 oz) ?IBW/kg (Calculated) : 61.5 ?HEPARIN DW (KG): 82.1  ? ?Labs: ?Recent Labs  ?  07/08/21 ?1829 07/08/21 ?0631 07/08/21 ?0631 07/09/21 ?9371 07/10/21 ?0405 07/10/21 ?1237 07/10/21 ?2211  ?HGB  --  11.7*   < > 11.1* 10.8*  --   --   ?HCT  --  36.6*  --  36.3* 34.8*  --   --   ?PLT  --  182  --  298 276  --   --   ?APTT  --   --   --   --   --  33  --   ?LABPROT  --   --   --   --   --  14.4  --   ?INR  --   --   --   --   --  1.1  --   ?HEPARINUNFRC  --   --   --   --   --   --  0.60  ?CREATININE 1.66*  --   --  1.51* 1.33*  --   --   ? < > = values in this interval not displayed.  ? ? ? ?Estimated Creatinine Clearance: 49.3 mL/min (A) (by C-G formula based on SCr of 1.33 mg/dL (H)). ? ? ?Medical History: ?Past Medical History:  ?Diagnosis Date  ? Acid reflux   ? Chronic knee pain   ? Depression   ? Hypertension   ? Ruptured lumbar disc   ? ? ?Medications:  ?Medications Prior to Admission  ?Medication Sig Dispense Refill Last Dose  ? gabapentin (NEURONTIN) 300 MG capsule Take 1 capsule (300 mg total) by mouth 3 (three) times daily. 90 capsule 1 06/11/2021  ? lisinopril (PRINIVIL,ZESTRIL) 5 MG tablet Take 5 mg daily by mouth.   07/06/2021  ? pantoprazole (PROTONIX) 40 MG tablet Take 40 mg by mouth daily.   07/06/2021  ? docusate sodium (COLACE) 100 MG capsule Take 1 capsule (100 mg total) by mouth every 12 (twelve) hours. (Patient taking differently: Take 100 mg by mouth 2 (two) times daily as needed for mild constipation or moderate constipation. ) 60 capsule 0   ? ? ?Assessment: ?76 year old male admitted for shortness of breath. CT Positive for acute PE with evidence of right heart strain  (RV/LV Ratio = 1.3) consistent with at least submassive (intermediate risk) PE.  ? ?Initial heparin level at goal (0.6) on 1350 units/hr of heparin. No bleeding issues noted.  ? ?Goal of Therapy:  ?Heparin level 0.3-0.7 units/ml ?Monitor platelets by anticoagulation protocol: Yes ?  ?Plan:  ?Continue heparin at 1350 units/hr ?Recheck with am labs ? ?Erin Hearing PharmD., BCPS ?Clinical Pharmacist ?07/10/2021 10:52 PM ? ? ?

## 2021-07-10 NOTE — Progress Notes (Signed)
PROGRESS NOTE   Jason Holt.  YIF:[id number] DOB: March 19, 1946 DOA: 06/25/2021 PCP: [facility]   Chief Complaint  Patient presents with   Respiratory Distress   Level of care: Stepdown  Brief Admission History:   76 y.o. male with medical history significant of hypertension, depression, and GERD who presented to the ED with worsening shortness of breath over the last 2-3 days.  EMS was called to his home yesterday and he was noted to be saturating in the 80th percentile while on room air, but he declined coming to the ED.  EMS was called again to his home and he was saturating in the 70th percentile this time.  He was found in his chair at home and was sitting in some urine.  According to his daughter at bedside who is his caretaker he has not had much of a cough, fevers, or chills.  No chest pain, nausea, vomiting or sick contacts noted.  He apparently lives at home by himself and does not smoke.   In the ED, chest x-ray noted to have findings of right sided pneumonia as well as leukocytosis.  Due to his significant hypoxemia and tachypnea he was started on BiPAP to which she is now more comfortable.  His lactic acid was 3.2 and is elevating to 3.6 and he has only received 1 L IV fluid and has been started on azithromycin and Rocephin.  EKG was sinus rhythm 93 bpm and troponin elevated at 150.  BNP is 1210.  Creatinine is 2.18 with baseline 0.8-0.9.  Flu and COVID testing negative.  07/08/2021:  Pt tolerating nightly bipap and it seems to be helping.   07/09/2021: clinically improving. WBC bump.   07/10/2021:  high oxygen requirement, d dimer checked, 7.34, CTA chest ordered: positive for bilateral PE with right heart strain.  IV heparin started. Likely transfer to [facility] for IR to evaluate for possible intervention.     Assessment and Plan: * Severe sepsis ([place]) Secondary to right-sided pneumonia-community-acquired Lactic acidosis has RESOLVED Procalcitonin is reassuring at  0.30  Continue Rocephin and azithromycin for now Monitor cultures Urine Legionella (pending) and strep pneumonia (negative) COVID and flu swab negative  Bilateral pulmonary embolism (HCC) Right heart strain on CT scan Echo completed Venous dopplers ordered of LEs I reached out to Dr. Halford Chessman to evaluate need for further intervention.  IV heparin infusion per pharm D ordered.   Patient and daughter updated at bedside.   "PE Focused" orderset bundle May require transfer to [facility] for IR evaluation regarding intervention   Acute respiratory failure with hypoxia ([place]) Initially thought secondary to pneumonia but likely related to bilateral PE  He is treated with BiPAP therapy Appreciate PCCM evaluation and recommendations.  He may require transfer to [facility] for IR eval regarding intervention for PE.   Leukocytosis - WBC trending down.  - clinical improvement is reassuring.    Pressure injury of skin Pressure Injury 06/29/2021 Buttocks Left Stage 2 -  Partial thickness loss of dermis presenting as a shallow open injury with a red, pink wound bed without slough. (Active)  06/11/2021 1308  Location: Buttocks  Location Orientation: Left  Staging: Stage 2 -  Partial thickness loss of dermis presenting as a shallow open injury with a red, pink wound bed without slough.  Wound Description (Comments):   Present on Admission: Yes     Pressure Injury 07/05/2021 Buttocks Right Stage 2 -  Partial thickness loss of dermis presenting as a shallow open injury  with a red, pink wound bed without slough. (Active)  06/11/2021 1309  Location: Buttocks  Location Orientation: Right  Staging: Stage 2 -  Partial thickness loss of dermis presenting as a shallow open injury with a red, pink wound bed without slough.  Wound Description (Comments):   Present on Admission: Yes    Elevated troponin Likely related to demand with sepsis physiology Continue to monitor 2D echocardiogram 07/08/21: 1. Left ventricular ejection  fraction, by estimation, is 60 to 65%. The left ventricle has normal function. The left ventricle has no regional wall motion abnormalities. There is mild concentric left ventricular hypertrophy. Left ventricular diastolic parameters are consistent with Grade II diastolic dysfunction (pseudonormalization). There is the interventricular septum is flattened in diastole ('D' shaped left ventricle), consistent with right ventricular volume overload.   2. Right ventricular systolic function is mildly reduced. The right ventricular size is dilated. There is moderately elevated pulmonary artery systolic pressure. RV mid wall not well visualized.   3. The mitral valve is grossly normal. No evidence of mitral valve regurgitation. No evidence of mitral stenosis.   4. The aortic valve is tricuspid. Aortic valve regurgitation is not visualized. No aortic stenosis is present.   AKI (acute kidney injury) ([place]) Baseline creatinine 0.8-0.9 Likely related to severe dehydration Improving with hydration Avoid nephrotoxic agents Monitor strict I's and O's Continue IV fluid   Essential hypertension Hold home lisinopril given AKI Currently with softer blood pressure readings  Depression Stable.   GERD (gastroesophageal reflux disease) Continue on IV PPI   Community acquired pneumonia Continue antibiotics as ordered.    DVT prophylaxis: [place] heparin Code Status: full  Family Communication:  Disposition: Status is: Inpatient Remains inpatient appropriate because: IV heparin infusion required  Consultants:  PCCM  Procedures:   Antimicrobials:    Subjective: He is having anxiety and shortness of breath symptoms.  Not sleeping well, no chest pain, occasional palpitations.  Objective: Vitals:   07/10/21 1000 07/10/21 1005 07/10/21 1100 07/10/21 1208  BP:   118/71 136/68  Pulse: (!) 103 (!) 105 85 92  Resp: 19 20 15  (!) 25  Temp:      TempSrc:      SpO2: 94% 95% 96% (!) 88%  Weight:      Height:         Intake/Output Summary (Last 24 hours) at 07/10/2021 1313 Last data filed at 07/10/2021 0600 Gross per 24 hour  Intake 2040.25 ml  Output 1450 ml  Net 590.25 ml   Filed Weights   07/08/21 0351 07/09/21 0446 07/10/21 0500  Weight: 94.7 kg 93.6 kg 95.1 kg   Examination:  General exam: chronically ill appearing male, awake, alert, on high flow [place], Appears calm and comfortable  Respiratory system: poor air movement, diminished bs right base, moderate increased work of breathing.  Cardiovascular system: normal S1 & S2 heard. No JVD, murmurs, rubs, gallops or clicks. No pedal edema. Gastrointestinal system: Abdomen is nondistended, soft and nontender. No organomegaly or masses felt. Normal bowel sounds heard. Central nervous system: Alert and oriented. No focal neurological deficits. Extremities: Symmetric 5 x 5 power. Skin: No rashes, lesions or ulcers. Psychiatry: Judgement and insight appear poor. Mood & affect appropriate.   Data Reviewed: I have personally reviewed following labs and imaging studies  CBC: Recent Labs  Lab 06/21/2021 0950 07/08/21 0631 07/09/21 0356 07/10/21 0405  WBC 14.1* 18.8* 25.5* 14.5*  NEUTROABS 11.2*  --  21.1* 11.7*  HGB 11.9* 11.7* 11.1* 10.8*  HCT 37.4*  36.6* 36.3* 34.8*  MCV 87.4 86.1 86.6 88.3  PLT 234 182 298 379    Basic Metabolic Panel: Recent Labs  Lab 06/30/2021 0950 07/08/21 0339 07/09/21 0356 07/10/21 0405  NA 143 143 147* 151*  K 3.4* 4.0 3.2* 3.7  CL 112* 114* 115* 117*  CO2 15* 15* 19* 23  GLUCOSE 183* 148* 124* 142*  BUN 43* 39* 42* 37*  CREATININE 2.18* 1.66* 1.51* 1.33*  CALCIUM 8.1* 8.3* 8.8* 8.3*  MG  --  2.2 1.9 1.9    CBG: Recent Labs  Lab 06/22/2021 2304 07/08/21 0345 07/08/21 0720 07/08/21 1144 07/08/21 1550  GLUCAP 169* 143* 118* 152* 121*    Recent Results (from the past 240 hour(s))  Resp Panel by RT-PCR (Flu A&B, Covid) Nasopharyngeal Swab     Status: None   Collection Time: 07/06/2021  9:28 AM    Specimen: Nasopharyngeal Swab; Nasopharyngeal(NP) swabs in vial transport medium  Result Value Ref Range Status   SARS Coronavirus 2 by RT PCR NEGATIVE NEGATIVE Final    Comment: (NOTE) SARS-CoV-2 target nucleic acids are NOT DETECTED.  The SARS-CoV-2 RNA is generally detectable in upper respiratory specimens during the acute phase of infection. The lowest concentration of SARS-CoV-2 viral copies this assay can detect is 138 copies/mL. A negative result does not preclude SARS-Cov-2 infection and should not be used as the sole basis for treatment or other patient management decisions. A negative result may occur with  improper specimen collection/handling, submission of specimen other than nasopharyngeal swab, presence of viral mutation(s) within the areas targeted by this assay, and inadequate number of viral copies(<138 copies/mL). A negative result must be combined with clinical observations, patient history, and epidemiological information. The expected result is Negative.  Fact Sheet for Patients:  EntrepreneurPulse.com.au  Fact Sheet for Healthcare Providers:  IncredibleEmployment.be  This test is no t yet approved or cleared by the [place] FDA and  has been authorized for detection and/or diagnosis of SARS-CoV-2 by FDA under an Emergency Use Authorization (EUA). This EUA will remain  in effect (meaning this test can be used) for the duration of the COVID-19 declaration under Section 564(b)(1) of the Act, 21 U.S.C.section 360bbb-3(b)(1), unless the authorization is terminated  or revoked sooner.       Influenza A by PCR NEGATIVE NEGATIVE Final   Influenza B by PCR NEGATIVE NEGATIVE Final    Comment: (NOTE) The Xpert Xpress SARS-CoV-2/FLU/RSV plus assay is intended as an aid in the diagnosis of influenza from Nasopharyngeal swab specimens and should not be used as a sole basis for treatment. Nasal washings and aspirates are  unacceptable for Xpert Xpress SARS-CoV-2/FLU/RSV testing.  Fact Sheet for Patients: EntrepreneurPulse.com.au  Fact Sheet for Healthcare Providers: IncredibleEmployment.be  This test is not yet approved or cleared by the [place] FDA and has been authorized for detection and/or diagnosis of SARS-CoV-2 by FDA under an Emergency Use Authorization (EUA). This EUA will remain in effect (meaning this test can be used) for the duration of the COVID-19 declaration under Section 564(b)(1) of the Act, 21 U.S.C. section 360bbb-3(b)(1), unless the authorization is terminated or revoked.  Performed at [facility], [address] [place], [place] [address]   Blood Culture (routine x 2)     Status: None (Preliminary result)   Collection Time: 06/29/2021  9:31 AM   Specimen: BLOOD  Result Value Ref Range Status   Specimen Description BLOOD BLOOD LEFT ARM  Final   Special Requests   Final  BOTTLES DRAWN AEROBIC AND ANAEROBIC Blood Culture adequate volume   Culture   Final    NO GROWTH 3 DAYS Performed at [facility], [address], [place], [place] [address]    Report Status PENDING  Incomplete  Blood Culture (routine x 2)     Status: None (Preliminary result)   Collection Time: 06/18/2021  9:51 AM   Specimen: BLOOD  Result Value Ref Range Status   Specimen Description BLOOD RIGHT ANTECUBITAL  Final   Special Requests   Final    BOTTLES DRAWN AEROBIC AND ANAEROBIC Blood Culture adequate volume   Culture   Final    NO GROWTH 3 DAYS Performed at [facility], [address] [place], [place] [address]    Report Status PENDING  Incomplete  MRSA Next Gen by PCR, Nasal     Status: None   Collection Time: 06/23/2021  1:11 PM   Specimen: Nasal Mucosa; Nasal Swab  Result Value Ref Range Status   MRSA by PCR Next Gen NOT DETECTED NOT DETECTED Final    Comment: (NOTE) The GeneXpert MRSA Assay (FDA approved for NASAL specimens only), is one component of a  comprehensive MRSA colonization surveillance program. It is not intended to diagnose MRSA infection nor to guide or monitor treatment for MRSA infections. Test performance is not FDA approved in patients less than 42 years old. Performed at [facility], [address] [place], [place] [address]      Radiology Studies: CT Angio Chest Pulmonary Embolism (PE) W or WO Contrast  Result Date: 07/10/2021 CLINICAL DATA:  Severe sepsis, hypoxemia, tachypnea EXAM: CT ANGIOGRAPHY CHEST WITH CONTRAST TECHNIQUE: Multidetector CT imaging of the chest was performed using the standard protocol during bolus administration of intravenous contrast. Multiplanar CT image reconstructions and MIPs were obtained to evaluate the vascular anatomy. RADIATION DOSE REDUCTION: This exam was performed according to the departmental dose-optimization program which includes automated exposure control, adjustment of the mA and/or kV according to patient size and/or use of iterative reconstruction technique. CONTRAST:  42mL OMNIPAQUE IOHEXOL 350 MG/ML SOLN COMPARISON:  None. FINDINGS: Cardiovascular: Satisfactory opacification of the pulmonary arteries to the segmental level. Nonocclusive pulmonary emboli involving the bifurcation of the main pulmonary artery extending into the left upper and lower lobar, segmental and subsegmental pulmonary arteries. Small pulmonary embolus in the right upper lobar pulmonary artery extending into the segmental and subsegmental branches. Small pulmonary artery emboli in the right lower lobe segmental branches. Normal heart size. No pericardial effusion. RV/LV ratio 1.3. Mediastinum/Nodes: No enlarged mediastinal, hilar, or axillary lymph nodes. Thyroid gland, trachea, and esophagus demonstrate no significant findings. Lungs/Pleura: Bilateral perihilar interstitial thickening and ground-glass opacities concerning for pulmonary edema. Small right pleural effusion. Trace left pleural effusion. No pneumothorax.  Upper Abdomen: No acute abnormality. Reflux of contrast into the IVC and hepatic veins. Musculoskeletal: No acute osseous abnormality. No aggressive osseous lesion. Review of the MIP images confirms the above findings. IMPRESSION: 1. Nonocclusive bilateral pulmonary emboli most severe at the bifurcation of the main pulmonary artery extending into the left upper and lower lobar, segmental and subsegmental pulmonary arteries. Positive for acute PE with CT evidence of right heart strain (RV/LV Ratio = 1.3) consistent with at least submassive (intermediate risk) PE. The presence of right heart strain has been associated with an increased risk of morbidity and mortality. Please refer to the "PE Focused" order set in EPIC. 2. Bilateral perihilar interstitial thickening and ground-glass opacities concerning for pulmonary edema. 3. Small right pleural effusion and  trace left pleural effusion. 4. Reflux of contrast into the IVC and hepatic veins, suggestive of right heart dysfunction. Critical Value/emergent results were called by telephone at the time of interpretation on 07/10/2021 at 12:10 pm to provider [facility] , who verbally acknowledged these results. Electronically Signed   By: Kathreen Devoid M.D.   On: 07/10/2021 12:10   DG CHEST PORT 1 VIEW  Result Date: 07/10/2021 CLINICAL DATA:  Sepsis, respiratory distress, pneumonia, leukocytosis EXAM: PORTABLE CHEST 1 VIEW COMPARISON:  Portable exam 0443 hours compared to 07/08/2021 FINDINGS: Enlargement of cardiac silhouette. Mediastinal contours and pulmonary vascularity normal. Increased opacity RIGHT mid lung and LEFT base question pneumonia. No pleural effusion or pneumothorax. Bones demineralized. IMPRESSION: Increased opacity RIGHT perihilar and LEFT base suspicious for pneumonia. Electronically Signed   By: Lavonia Dana M.D.   On: 07/10/2021 08:23   ECHOCARDIOGRAM COMPLETE  Result Date: 07/08/2021    ECHOCARDIOGRAM REPORT   Patient Name:   Jason Holt. Date of Exam: 07/08/2021 Medical Rec #:  [id number]               Height:       65.0 in Accession #:    [id number]              Weight:       208.8 lb Date of Birth:  [birth year]               BSA:          2.015 m Patient Age:    39 years                BP:           170/62 mmHg Patient Gender: M                       HR:           93 bpm. Exam Location:  [facility] Procedure: 2D Echo, Cardiac Doppler, Color Doppler and Intracardiac            Opacification Agent Indications:    Elevated Troponin  History:        Patient has prior history of Echocardiogram examinations, most                 recent 02/18/2011. Risk Factors:Hypertension. Sepsis.  Sonographer:    Wenda Low Referring Phys: [id number] [facility]  Sonographer Comments: Patient is morbidly obese. IMPRESSIONS  1. Left ventricular ejection fraction, by estimation, is 60 to 65%. The left ventricle has normal function. The left ventricle has no regional wall motion abnormalities. There is mild concentric left ventricular hypertrophy. Left ventricular diastolic parameters are consistent with Grade II diastolic dysfunction (pseudonormalization). There is the interventricular septum is flattened in diastole ('D' shaped left ventricle), consistent with right ventricular volume overload.  2. Right ventricular systolic function is mildly reduced. The right ventricular size is dilated. There is moderately elevated pulmonary artery systolic pressure. RV mid wall not well visualized.  3. The mitral valve is grossly normal. No evidence of mitral valve regurgitation. No evidence of mitral stenosis.  4. The aortic valve is tricuspid. Aortic valve regurgitation is not visualized. No aortic stenosis is present. Comparison(s): Unable to see 2012 study. Conclusion(s)/Recommendation(s): RV dysfunction and dilation (unclear if new, no true McConnells sign) in the setting of elevated troponin, PE is in differential. FINDINGS  Left Ventricle: Left ventricular ejection  fraction, by estimation, is 60 to 65%.  The left ventricle has normal function. The left ventricle has no regional wall motion abnormalities. Definity contrast agent was given IV to delineate the left ventricular  endocardial borders. The left ventricular internal cavity size was normal in size. There is mild concentric left ventricular hypertrophy. The interventricular septum is flattened in diastole ('D' shaped left ventricle), consistent with right ventricular  volume overload. Left ventricular diastolic parameters are consistent with Grade II diastolic dysfunction (pseudonormalization). Right Ventricle: RVOT is dilated. The right ventricular size is moderately enlarged. No increase in right ventricular wall thickness. Right ventricular systolic function is mildly reduced. There is moderately elevated pulmonary artery systolic pressure. The tricuspid regurgitant velocity is 3.11 m/s, and with an assumed right atrial pressure of 15 mmHg, the estimated right ventricular systolic pressure is 82.9 mmHg. Left Atrium: Left atrial size was normal in size. Right Atrium: Right atrial size was normal in size. Pericardium: Trivial pericardial effusion is present. Presence of epicardial fat layer. Mitral Valve: The mitral valve is grossly normal. No evidence of mitral valve regurgitation. No evidence of mitral valve stenosis. MV peak gradient, 6.4 mmHg. The mean mitral valve gradient is 2.0 mmHg. Tricuspid Valve: The tricuspid valve is normal in structure. Tricuspid valve regurgitation is mild. Aortic Valve: The aortic valve is tricuspid. There is mild aortic valve annular calcification. Aortic valve regurgitation is not visualized. No aortic stenosis is present. Aortic valve mean gradient measures 2.0 mmHg. Aortic valve peak gradient measures 4.5 mmHg. Aortic valve area, by VTI measures 2.65 cm. Pulmonic Valve: The pulmonic valve was normal in structure. Pulmonic valve regurgitation is mild. No evidence of pulmonic  stenosis. Aorta: The aortic root and ascending aorta are structurally normal, with no evidence of dilitation. IAS/Shunts: No atrial level shunt detected by color flow Doppler.  LEFT VENTRICLE PLAX 2D LVIDd:         4.00 cm     Diastology LVIDs:         2.50 cm     LV e' medial:    4.24 cm/s LV PW:         1.00 cm     LV E/e' medial:  16.2 LV IVS:        1.30 cm     LV e' lateral:   5.44 cm/s LVOT diam:     2.00 cm     LV E/e' lateral: 12.6 LV SV:         46 LV SV Index:   23 LVOT Area:     3.14 cm  LV Volumes (MOD) LV vol d, MOD A2C: 36.8 ml LV vol d, MOD A4C: 30.7 ml LV vol s, MOD A2C: 16.6 ml LV vol s, MOD A4C: 12.1 ml LV SV MOD A2C:     20.2 ml LV SV MOD A4C:     30.7 ml LV SV MOD BP:      19.3 ml RIGHT VENTRICLE RV Basal diam:  4.50 cm RV Mid diam:    4.00 cm RV S prime:     9.90 cm/s TAPSE (M-mode): 1.3 cm LEFT ATRIUM             Index        RIGHT ATRIUM           Index LA diam:        4.10 cm 2.04 cm/m   RA Area:     19.30 cm LA Vol (A2C):   45.1 ml 22.39 ml/m  RA Volume:   62.50 ml  31.02  ml/m LA Vol (A4C):   47.2 ml 23.43 ml/m LA Biplane Vol: 48.7 ml 24.17 ml/m  AORTIC VALVE                    PULMONIC VALVE AV Area (Vmax):    2.59 cm     PV Vmax:       0.54 m/s AV Area (Vmean):   2.57 cm     PV Peak grad:  1.2 mmHg AV Area (VTI):     2.65 cm AV Vmax:           106.00 cm/s AV Vmean:          59.300 cm/s AV VTI:            0.172 m AV Peak Grad:      4.5 mmHg AV Mean Grad:      2.0 mmHg LVOT Vmax:         87.50 cm/s LVOT Vmean:        48.500 cm/s LVOT VTI:          0.145 m LVOT/AV VTI ratio: 0.84  AORTA Ao Root diam: 2.80 cm Ao Asc diam:  3.30 cm MITRAL VALVE                TRICUSPID VALVE MV Area (PHT): 6.02 cm     TR Peak grad:   38.7 mmHg MV Area VTI:   2.32 cm     TR Vmax:        311.00 cm/s MV Peak grad:  6.4 mmHg MV Mean grad:  2.0 mmHg     SHUNTS MV Vmax:       1.26 m/s     Systemic VTI:  0.14 m MV Vmean:      63.8 cm/s    Systemic Diam: 2.00 cm MV Decel Time: 126 msec MV E velocity: 68.60  cm/s MV A velocity: 114.00 cm/s MV E/A ratio:  0.60 Rudean Haskell MD Electronically signed by Rudean Haskell MD Signature Date/Time: 07/08/2021/3:49:54 PM    Final     Scheduled Meds:  Chlorhexidine Gluconate Cloth  6 each Topical Q0600   gabapentin  200 mg Oral TID   metoprolol tartrate  25 mg Oral BID   pantoprazole  40 mg Oral Daily   Continuous Infusions:  azithromycin 500 mg (07/10/21 1004)   cefTRIAXone (ROCEPHIN)  IV 2 g (07/10/21 0932)   dextrose 10 mL/hr at 07/10/21 1311   heparin 1,350 Units/hr (07/10/21 1311)     LOS: 3 days   Critical Care Procedure Note Authorized and Performed by: Murvin Natal MD  Total Critical Care time:  55 mins Due to a high probability of clinically significant, life threatening deterioration, the patient required my highest level of preparedness to intervene emergently and I personally spent this critical care time directly and personally managing the patient.  This critical care time included obtaining a history; examining the patient, pulse oximetry; ordering and review of studies; arranging urgent treatment with development of a management plan; evaluation of patient's response of treatment; frequent reassessment; and discussions with other providers.  This critical care time was performed to assess and manage the high probability of imminent and life threatening deterioration that could result in multi-organ failure.  It was exclusive of separately billable procedures and treating other patients and teaching time.    Irwin Brakeman, MD How to contact the [facility] Attending or Consulting provider [place] or covering provider during after hours [place], for this  patient?  Check the care team in [facility] and look for a) attending/consulting [facility] provider listed and b) the [facility] team listed Log into [email or web address] and use [place]'s universal password to access. If you do not have the password, please contact the hospital operator. Locate the [facility]  provider you are looking for under [facility] and page to a number that you can be directly reached. If you still have difficulty reaching the provider, please page the [facility] [place] (Director on Call) for the Hospitalists listed on amion for assistance.  07/10/2021, 1:13 PM

## 2021-07-10 NOTE — Progress Notes (Signed)
Patient refuses BiPAP at this time. Says he doesn't feel well. Will have RN call if he changes his mind. ?

## 2021-07-10 NOTE — Assessment & Plan Note (Addendum)
Right heart strain on CT scan ?Echo completed ?Venous dopplers ordered of LEs ?I reached out to Dr. Halford Chessman to evaluate need for further intervention.  ?IV heparin infusion per pharm D ordered.   ?Patient and daughter updated at bedside.   ?"PE Focused" orderset bundle ?Dr. Halford Chessman arranged for transfer to [facility] for IR evaluation regarding intervention  ?

## 2021-07-10 NOTE — TOC Initial Note (Addendum)
Transition of Care (TOC) - Initial/Assessment Note  ? ? ?Patient Details  ?Name: Jason Holt. ?MRN: [id number] ?Date of Birth: 07/25/1945 ? ?Transition of Care (TOC) CM/SW Contact:    ?Shade Flood, LCSW ?Phone Number: ?07/10/2021, 11:42 AM ? ?Clinical Narrative:                 ? ?Pt admitted from home. PT recommending HH PT at dc. Met with pt and his wife at bedside to review dc planning. Pt and his wife state that pt does not use O2 at home. They are hoping that he will wean from O2 prior to dc. They are agreeable to [facility] at dc and CMS provider options reviewed. Referral made to [facility] at pt request. MD anticipating Monday dc. TOC will follow. ? ?Received consult for benefits check on Eliquis/Xarelto. [facility] Pharmacist did the benefits check and stated pt obtains meds through the [place] and when he checked on it, he was told there would be little to no cost to patient. Updated MD. ? ?Expected Discharge Plan: [place] ?Barriers to Discharge: Continued Medical Work up ? ? ?Patient Goals and CMS Choice ?Patient states their goals for this hospitalization and ongoing recovery are:: go home ?CMS Medicare.gov Compare Post Acute Care list provided to:: Patient ?Choice offered to / list presented to : Patient ? ?Expected Discharge Plan and Services ?Expected Discharge Plan: [place] ?In-house Referral: Clinical Social Work ?  ?Post Acute Care Choice: Home Health ?Living arrangements for the past 2 months: [place] ?                ?  ?  ?  ?  ?  ?HH Arranged: Therapist, sports, PT ?[place] Agency: [place] ([place]) ?Date HH Agency Contacted: 07/10/21 ?  ?Representative spoke with at [place]: Vaughan Basta ? ?Prior Living Arrangements/Services ?Living arrangements for the past 2 months: [place] ?Lives with:: Spouse ?Patient language and need for interpreter reviewed:: Yes ?Do you feel safe going back to the place where you live?: Yes      ?Need for Family Participation in  Patient Care: Yes (Comment) ?Care giver support system in place?: Yes (comment) ?Current home services: DME ?Criminal Activity/Legal Involvement Pertinent to Current Situation/Hospitalization: No - Comment as needed ? ?Activities of Daily Living ?Home Assistive Devices/Equipment: CBG Meter, Cane (specify quad or straight) ?ADL Screening (condition at time of admission) ?Patient's cognitive ability adequate to safely complete daily activities?: Yes ?Is the patient deaf or have difficulty hearing?: No ?Does the patient have difficulty seeing, even when wearing glasses/contacts?: No ?Does the patient have difficulty concentrating, remembering, or making decisions?: No ?Patient able to express need for assistance with ADLs?: Yes ?Does the patient have difficulty dressing or bathing?: No ?Independently performs ADLs?: Yes (appropriate for developmental age) ?Does the patient have difficulty walking or climbing stairs?: No ?Weakness of Legs: None ?Weakness of Arms/Hands: None ? ?Permission Sought/Granted ?Permission sought to share information with : Customer service manager ?Permission granted to share information with : Yes, Verbal Permission Granted ?   ? Permission granted to share info w AGENCY: [facility] ?   ?   ? ?Emotional Assessment ?Appearance:: Appears stated age ?Attitude/Demeanor/Rapport: Engaged ?Affect (typically observed): Pleasant ?Orientation: : Oriented to Self, Oriented to Place, Oriented to  Time, Oriented to Situation ?Alcohol / Substance Use: Not Applicable ?Psych Involvement: No (comment) ? ?Admission diagnosis:  AKI (acute kidney injury) ([place]) [N17.9] ?Severe sepsis ([place]) [A41.9, R65.20] ?Acute  hypoxemic respiratory failure ([place]) [J96.01] ?Community acquired pneumonia, unspecified laterality [J18.9] ?Patient Active Problem List  ? Diagnosis Date Noted  ? Leukocytosis 07/09/2021  ? Acute hypoxemic respiratory failure ([place])   ? CAP (community acquired pneumonia)   ? Community acquired pneumonia   ?  Severe sepsis ([place]) 06/15/2021  ? Acute respiratory failure with hypoxia ([place]) 07/04/2021  ? AKI (acute kidney injury) ([place]) 06/18/2021  ? Elevated troponin 06/14/2021  ? Pressure injury of skin 06/13/2021  ? AMS (altered mental status) 06/08/2019  ? Depression 06/08/2019  ? Essential hypertension 06/08/2019  ? GERD (gastroesophageal reflux disease) 08/15/2013  ? ?PCP:  [facility] ?Pharmacy:   ?[id number] [place], [place] - [address]?Phone: [phone]: [phone]? ?[facility] [place] [address] ?[id number]?Phone: [phone]: [phone]? ? ? ? ?Social Determinants of Health (SDOH) Interventions ?  ? ?Readmission Risk Interventions ?No flowsheet data found. ? ? ?

## 2021-07-10 NOTE — Progress Notes (Signed)
ANTICOAGULATION CONSULT NOTE - Initial Consult ? ?Pharmacy Consult for heparin ?Indication: pulmonary embolus ? ?Allergies  ?Allergen Reactions  ? Eggs Or Egg-Derived Products Hives  ? Influenza Virus Vacc Split Pf Hives  ? Dye Fdc Red [Red Dye]   ? ? ?Patient Measurements: ?Height: 5\' 5"  (165.1 cm) ?Weight: 95.1 kg (209 lb 10.5 oz) ?IBW/kg (Calculated) : 61.5 ?HEPARIN DW (KG): 82.1  ? ?Labs: ?Recent Labs  ?  07/08/21 ?6269 07/08/21 ?4854 07/08/21 ?6270 07/09/21 ?3500 07/10/21 ?0405  ?HGB  --  11.7*   < > 11.1* 10.8*  ?HCT  --  36.6*  --  36.3* 34.8*  ?PLT  --  182  --  298 276  ?CREATININE 1.66*  --   --  1.51* 1.33*  ? < > = values in this interval not displayed.  ? ? ?Estimated Creatinine Clearance: 49.3 mL/min (A) (by C-G formula based on SCr of 1.33 mg/dL (H)). ? ? ?Medical History: ?Past Medical History:  ?Diagnosis Date  ? Acid reflux   ? Chronic knee pain   ? Depression   ? Hypertension   ? Ruptured lumbar disc   ? ? ?Medications:  ?Medications Prior to Admission  ?Medication Sig Dispense Refill Last Dose  ? gabapentin (NEURONTIN) 300 MG capsule Take 1 capsule (300 mg total) by mouth 3 (three) times daily. 90 capsule 1 06/12/2021  ? lisinopril (PRINIVIL,ZESTRIL) 5 MG tablet Take 5 mg daily by mouth.   07/06/2021  ? pantoprazole (PROTONIX) 40 MG tablet Take 40 mg by mouth daily.   07/06/2021  ? docusate sodium (COLACE) 100 MG capsule Take 1 capsule (100 mg total) by mouth every 12 (twelve) hours. (Patient taking differently: Take 100 mg by mouth 2 (two) times daily as needed for mild constipation or moderate constipation. ) 60 capsule 0   ? ? ?Assessment: ?76 year old male admitted for shortness of breath. CT Positive for acute PE with evidence of right heart strain (RV/LV Ratio = 1.3) consistent with at least submassive (intermediate risk) PE.  ? ?Goal of Therapy:  ?Heparin level 0.3-0.7 units/ml ?Monitor platelets by anticoagulation protocol: Yes ?  ?Plan:  ?DC SQ Heparin ?Give 5000 units bolus x 1 ?Start  heparin infusion at 1350 units/hr ?Check anti-Xa level in 8 hours and daily while on heparin ?Continue to monitor H&H and platelets ?Daily labs ? ?Lorenso Courier Chika ?07/10/2021,12:39 PM ? ? ?

## 2021-07-10 NOTE — Progress Notes (Signed)
RN informed RT that patient came off BIPAP around 0015-0030. Patient wore machine for about an hour. Unit on standby. ?

## 2021-07-10 NOTE — Progress Notes (Signed)
[facility] ? ? ?Patient name: Jason Holt. Admit date: 06/23/2021  ?DOB: 1945-07-23 LOS: 3  ?MRN: [id number] Consult date: 06/27/2021  ?Referring provider: Dr. Manuella Ghazi, [facility]: short of breath  ? ? ?History:  ?76 yo male presented to [facility] with dyspnea for 3 days.  EMS called on 2/27 and he had SpO2 in the 80's on room air.  He declined option to come to ER then.  Symptoms got worse and he came to ER.  SpO2 in 70's on room air.  Found to have Rt sided pneumonia.  Started on IV fluids and antibiotics for sepsis, and Bipap with oxygen for respiratory failure.  PCCM consulted to assist with management in ICU. ? ?Past medical history:  ?HTN, Depression, GERD ? ?Significant events:  ?2/28 Admit ?3/03 start heparin gtt, arrange for transfer to [facility] ? ?Studies:  ?Echo 07/08/21 >> EF 60 to 65%, mild LVH, grade 2 DD, mod elevation in PASP ?CT angio chest 07/10/21 >> PE at bifurcation of main PA extending into Lt upper and lower lobes, small PE in RUL and RLL, RV:LV 1.3, b/l perihilar thickening and GGO ?Doppler legs b/l 07/10/21 >> no DVT in upper legs; calf veins not assessed because SCDs were on ? ?Micro:  ?COVID/Flu 2/28 >> negative ?Blood 2/28 >>  ?Legionella 2/28 >> negative ?Pneumococcal 2/28 >> negative ? Lines:  ? ?  ?Antibiotics:  ?Rocephin 2/28 >> ?Zithromax 2/28 >>  ? Consults:  ? ?  ? ?Interim history:  ?Still needing increased O2 >> on 15 liters heated high flow at rest.  Breathing okay as long as he stays in bed.  Denies chest pain.   ? ?Vital signs:  ?BP (!) 147/75   Pulse 95   Temp 98.3 ?F (36.8 ?C) (Axillary)   Resp 16   Ht 5\' 5"  (1.651 m)   Wt 95.1 kg   SpO2 94%   BMI 34.89 kg/m?  ? Intake/output:  ?I/O last 3 completed shifts: ?In: 2424.7 [P.O.:360; I.V.:1714.7; IV Piggyback:350.1] ?Out: 3900 [Urine:3900] ?  ?Physical exam:  ? ?General - alert, wearing oxygen ?Eyes - pupils reactive ?ENT - no sinus tenderness, no stridor ?Cardiac  - regular rate/rhythm, no murmur ?Chest - equal breath sounds b/l, no wheezing or rales ?Abdomen - soft, non tender, + bowel sounds ?Extremities - no cyanosis, clubbing, or edema ?Skin - no rashes ?Neuro - normal strength, moves extremities, follows commands ?Psych - normal mood and behavior ? ? Best practice:  ? ?DVT - heparin gtt ?SUP - Protonix ?Nutrition - D3  ? ? ?Discussion:  ?He has persistent increase in O2 needs.  Found to have PE with significant clot burden and RV strain pattern.  In retrospect, this might have been present at time of admission, but difficult to determine with certainty.  sPESI score is 3, indicated increased risk for complication.  Uncertain whether he will need catheter directed therapy for his PE, but need to move to [place] for further assessment. ? ?Assessment/plan:  ? ?Acute hypoxic respiratory failure from community acquired bacterial pneumonia and acute pulmonary embolism. ?- continue heparin gtt ?- will transfer to [place] in case he needs catheter directed therapy for his PE ?- goal SpO2 . 92% ?- Bipap prn ?- day 4 of ABx, currently on rocephin and zithromax ? ?Mild hypernatremia from diuresis. ?- baseline creatinine 0.82 from 06/07/19 ?- monitor urine outpt, f/u BMET ?- hold additional lasix for now ? ?Chronic diastolic CHF. ?- monitor hemodynamics ? ?  Back pain, neuropathy. ?- per primary team ? ?Pressure injuries. ?- stage 2 Lt buttock, POA ?- stage 2 Rt buttock, POA ?- wound care ? ? ?Resolved hospital problems:  ?Elevated troponin from demand ischemia in setting of sepsis and acute pulmonary embolism, Sepsis, AKI from ATN in setting of sepsis, Anion gap acidosis with lactic acidosis, Non anion gap acidosis from renal failure, Leukocytosis ? ?Goals of care/Family discussions:  ?Code status: Full ? ?Updated pt's daughter at bedside ? ?Labs:  ? ?CMP Latest Ref Rng & Units 07/10/2021 07/09/2021 07/08/2021  ?Glucose 70 - 99 mg/dL 142(H) 124(H) 148(H)  ?BUN 8 - 23  mg/dL 37(H) 42(H) 39(H)  ?Creatinine 0.61 - 1.24 mg/dL 1.33(H) 1.51(H) 1.66(H)  ?Sodium 135 - 145 mmol/L 151(H) 147(H) 143  ?Potassium 3.5 - 5.1 mmol/L 3.7 3.2(L) 4.0  ?Chloride 98 - 111 mmol/L 117(H) 115(H) 114(H)  ?CO2 22 - 32 mmol/L 23 19(L) 15(L)  ?Calcium 8.9 - 10.3 mg/dL 8.3(L) 8.8(L) 8.3(L)  ?Total Protein 6.5 - 8.1 g/dL 6.6 7.0 6.8  ?Total Bilirubin 0.3 - 1.2 mg/dL 0.1(L) 0.1(L) 0.7  ?Alkaline Phos 38 - 126 U/L 123 113 104  ?AST 15 - 41 U/L 21 25 30   ?ALT 0 - 44 U/L 23 22 21   ? ? ?CBC Latest Ref Rng & Units 07/10/2021 07/09/2021 07/08/2021  ?WBC 4.0 - 10.5 K/uL 14.5(H) 25.5(H) 18.8(H)  ?Hemoglobin 13.0 - 17.0 g/dL 10.8(L) 11.1(L) 11.7(L)  ?Hematocrit 39.0 - 52.0 % 34.8(L) 36.3(L) 36.6(L)  ?Platelets 150 - 400 K/uL 276 298 182  ? ? ?ABG ?   ?Component Value Date/Time  ? PHART 7.27 (L) 07/01/2021 0947  ? PCO2ART 31 (L) 06/29/2021 0947  ? PO2ART 59 (L) 06/13/2021 0947  ? HCO3 14.4 (L) 07/05/2021 0947  ? TCO2 25.9 03/01/2011 0429  ? ACIDBASEDEF 11.5 (H) 06/10/2021 0947  ? O2SAT 88.7 07/05/2021 0947  ? ? ?CBG (last 3)  ?Recent Labs  ?  07/08/21 ?0720 07/08/21 ?1144 07/08/21 ?1550  ?GLUCAP 118* 152* 121*  ? ?  ? ?Critical care time: 38 minutes  ?Chesley Mires, MD ?[place] ?Pager - [phone] - [phone] ?07/10/2021, 2:10 PM ? ? ? ? ? ? ? ?

## 2021-07-11 DIAGNOSIS — R6520 Severe sepsis without septic shock: Secondary | ICD-10-CM | POA: Diagnosis not present

## 2021-07-11 DIAGNOSIS — J9601 Acute respiratory failure with hypoxia: Secondary | ICD-10-CM | POA: Diagnosis not present

## 2021-07-11 DIAGNOSIS — I2699 Other pulmonary embolism without acute cor pulmonale: Secondary | ICD-10-CM | POA: Diagnosis not present

## 2021-07-11 DIAGNOSIS — N179 Acute kidney failure, unspecified: Secondary | ICD-10-CM | POA: Diagnosis not present

## 2021-07-11 DIAGNOSIS — A419 Sepsis, unspecified organism: Secondary | ICD-10-CM | POA: Diagnosis not present

## 2021-07-11 LAB — CBC WITH DIFFERENTIAL/PLATELET
Abs Immature Granulocytes: 0.33 10*3/uL — ABNORMAL HIGH (ref 0.00–0.07)
Basophils Absolute: 0.2 10*3/uL — ABNORMAL HIGH (ref 0.0–0.1)
Basophils Relative: 1 %
Eosinophils Absolute: 0.4 10*3/uL (ref 0.0–0.5)
Eosinophils Relative: 3 %
HCT: 36.4 % — ABNORMAL LOW (ref 39.0–52.0)
Hemoglobin: 10.8 g/dL — ABNORMAL LOW (ref 13.0–17.0)
Immature Granulocytes: 2 %
Lymphocytes Relative: 10 %
Lymphs Abs: 1.6 10*3/uL (ref 0.7–4.0)
MCH: 26.8 pg (ref 26.0–34.0)
MCHC: 29.7 g/dL — ABNORMAL LOW (ref 30.0–36.0)
MCV: 90.3 fL (ref 80.0–100.0)
Monocytes Absolute: 1 10*3/uL (ref 0.1–1.0)
Monocytes Relative: 7 %
Neutro Abs: 11.7 10*3/uL — ABNORMAL HIGH (ref 1.7–7.7)
Neutrophils Relative %: 77 %
Platelets: 256 10*3/uL (ref 150–400)
RBC: 4.03 MIL/uL — ABNORMAL LOW (ref 4.22–5.81)
RDW: 15.6 % — ABNORMAL HIGH (ref 11.5–15.5)
WBC: 15.1 10*3/uL — ABNORMAL HIGH (ref 4.0–10.5)
nRBC: 0.3 % — ABNORMAL HIGH (ref 0.0–0.2)

## 2021-07-11 LAB — COMPREHENSIVE METABOLIC PANEL
ALT: 22 U/L (ref 0–44)
AST: 19 U/L (ref 15–41)
Albumin: 3 g/dL — ABNORMAL LOW (ref 3.5–5.0)
Alkaline Phosphatase: 130 U/L — ABNORMAL HIGH (ref 38–126)
Anion gap: 15 (ref 5–15)
BUN: 34 mg/dL — ABNORMAL HIGH (ref 8–23)
CO2: 20 mmol/L — ABNORMAL LOW (ref 22–32)
Calcium: 8 mg/dL — ABNORMAL LOW (ref 8.9–10.3)
Chloride: 113 mmol/L — ABNORMAL HIGH (ref 98–111)
Creatinine, Ser: 1.62 mg/dL — ABNORMAL HIGH (ref 0.61–1.24)
GFR, Estimated: 43 mL/min — ABNORMAL LOW (ref >60)
Glucose, Bld: 141 mg/dL — ABNORMAL HIGH (ref 70–99)
Potassium: 4 mmol/L (ref 3.5–5.1)
Sodium: 148 mmol/L — ABNORMAL HIGH (ref 135–145)
Total Bilirubin: 0.4 mg/dL (ref 0.3–1.2)
Total Protein: 6.8 g/dL (ref 6.5–8.1)

## 2021-07-11 LAB — PROCALCITONIN: Procalcitonin: 0.21 ng/mL

## 2021-07-11 LAB — HEPARIN LEVEL (UNFRACTIONATED)
Heparin Unfractionated: 0.73 IU/mL — ABNORMAL HIGH (ref 0.30–0.70)
Heparin Unfractionated: 0.59 IU/mL (ref 0.30–0.70)

## 2021-07-11 MED ORDER — BUPRENORPHINE 5 MCG/HR TD PTWK
1.0000 | MEDICATED_PATCH | TRANSDERMAL | Status: DC
  Administered 2021-07-11: 1 via TRANSDERMAL
  Filled 2021-07-11: qty 1

## 2021-07-11 MED ORDER — SODIUM CHLORIDE 0.9 % IV SOLN: INTRAVENOUS | Status: DC | PRN

## 2021-07-11 NOTE — Progress Notes (Signed)
ANTICOAGULATION CONSULT NOTE - Initial Consult ? ?Pharmacy Consult for heparin ?Indication: pulmonary embolus ? ?Allergies  ?Allergen Reactions  ? Eggs Or Egg-Derived Products Hives  ? Influenza Virus Vacc Split Pf Hives  ? Dye Fdc Red [Red Dye]   ? ? ?Patient Measurements: ?Height: '5\' 5"'$  (165.1 cm) ?Weight: 97.1 kg (214 lb 1.1 oz) ?IBW/kg (Calculated) : 61.5 ?HEPARIN DW (KG): 82.1  ? ?Labs: ?Recent Labs  ?  07/09/21 ?0356 07/10/21 ?0405 07/10/21 ?1237 07/10/21 ?2211 07/11/21 ?4580 07/11/21 ?1327  ?HGB 11.1* 10.8*  --   --  10.8*  --   ?HCT 36.3* 34.8*  --   --  36.4*  --   ?PLT 298 276  --   --  256  --   ?APTT  --   --  33  --   --   --   ?LABPROT  --   --  14.4  --   --   --   ?INR  --   --  1.1  --   --   --   ?HEPARINUNFRC  --   --   --  0.60 0.73* 0.59  ?CREATININE 1.51* 1.33*  --   --  1.62*  --   ? ? ? ?Estimated Creatinine Clearance: 40.9 mL/min (A) (by C-G formula based on SCr of 1.62 mg/dL (H)). ? ? ?Medical History: ?Past Medical History:  ?Diagnosis Date  ? Acid reflux   ? Chronic knee pain   ? Depression   ? Hypertension   ? Ruptured lumbar disc   ? ? ?Medications:  ?Medications Prior to Admission  ?Medication Sig Dispense Refill Last Dose  ? buprenorphine (BUTRANS) 5 MCG/HR PTWK Place 1 patch onto the skin once a week.   07/09/2021  ? gabapentin (NEURONTIN) 300 MG capsule Take 1 capsule (300 mg total) by mouth 3 (three) times daily. 90 capsule 1 06/16/2021  ? lisinopril (PRINIVIL,ZESTRIL) 5 MG tablet Take 5 mg daily by mouth.   07/06/2021  ? pantoprazole (PROTONIX) 40 MG tablet Take 40 mg by mouth daily.   07/06/2021  ? docusate sodium (COLACE) 100 MG capsule Take 1 capsule (100 mg total) by mouth every 12 (twelve) hours. (Patient taking differently: Take 100 mg by mouth 2 (two) times daily as needed for mild constipation or moderate constipation. ) 60 capsule 0   ? ? ?Assessment: ?76 year old male admitted for shortness of breath. CT Positive for acute PE with evidence of right heart strain (RV/LV Ratio =  1.3) consistent with at least submassive PE. ? ?Heparin level therapeutic this afternoon at 0.59 after slight rate decrease for high level earlier today. CBC stable. No bleed issues reported. Per CCM, considering possible IR intervention but will follow up in the morning on patient's status prior to making decision. Holding systemic lytics for now with significantly elevated BP. ? ?Noted - heparin charted as "paused" in [facility] but confirmed with RN, heparin was not stopped at all on transfer from [facility]. ? ?Goal of Therapy:  ?Heparin level 0.3-0.7 units/ml ?Monitor platelets by anticoagulation protocol: Yes ?  ?Plan:  ?Continue heparin infusion 1250 units/hr ?Confirmatory heparin level with AM labs ?Monitor daily CBC, s/sx bleeding ? ? ?Arturo Morton, PharmD, BCPS ?Please check AMION for all [place] contact numbers ?Clinical Pharmacist ?07/11/2021 2:33 PM ? ?

## 2021-07-11 NOTE — Progress Notes (Signed)
Pt states he does not want to wear BIPAP tonight. If he does, he states he will call later.  ?

## 2021-07-11 NOTE — Progress Notes (Signed)
ANTICOAGULATION CONSULT NOTE - Follow Up Consult ? ?Pharmacy Consult for heparin ?Indication: pulmonary embolus ? ?Labs: ?Recent Labs  ?  07/09/21 ?0356 07/10/21 ?0405 07/10/21 ?1237 07/10/21 ?2211 07/11/21 ?6283  ?HGB 11.1* 10.8*  --   --  10.8*  ?HCT 36.3* 34.8*  --   --  36.4*  ?PLT 298 276  --   --  256  ?APTT  --   --  33  --   --   ?LABPROT  --   --  14.4  --   --   ?INR  --   --  1.1  --   --   ?HEPARINUNFRC  --   --   --  0.60 0.73*  ?CREATININE 1.51* 1.33*  --   --   --   ? ? ?Assessment: ?76yo male supratherapeutic on heparin after one level at goal; no infusion issues or signs of bleeding per RN. ? ?Goal of Therapy:  ?Heparin level 0.3-0.7 units/ml ?  ?Plan:  ?Will decrease heparin infusion by 1 units/kg/hr to 1250 units/hr and check level in 8 hours.   ? ?Wynona Neat, PharmD, BCPS  ?07/11/2021,6:08 AM ? ? ?

## 2021-07-11 NOTE — Progress Notes (Signed)
PROGRESS NOTE   Jason Holt.  KGY:[id number] DOB: 1946/02/18 DOA: 07/06/2021 PCP: [facility]   Chief Complaint  Patient presents with   Respiratory Distress   Level of care: ICU  Brief Admission History:   76 y.o. male with medical history significant of hypertension, depression, and GERD who presented to the ED with worsening shortness of breath over the last 2-3 days.  EMS was called to his home yesterday and he was noted to be saturating in the 80th percentile while on room air, but he declined coming to the ED.  EMS was called again to his home and he was saturating in the 70th percentile this time.  He was found in his chair at home and was sitting in some urine.  According to his daughter at bedside who is his caretaker he has not had much of a cough, fevers, or chills.  No chest pain, nausea, vomiting or sick contacts noted.  He apparently lives at home by himself and does not smoke.   In the ED, chest x-ray noted to have findings of right sided pneumonia as well as leukocytosis.  Due to his significant hypoxemia and tachypnea he was started on BiPAP to which she is now more comfortable.  His lactic acid was 3.2 and is elevating to 3.6 and he has only received 1 L IV fluid and has been started on azithromycin and Rocephin.  EKG was sinus rhythm 93 bpm and troponin elevated at 150.  BNP is 1210.  Creatinine is 2.18 with baseline 0.8-0.9.  Flu and COVID testing negative.  07/08/2021:  Pt tolerating nightly bipap and it seems to be helping.   07/09/2021: clinically improving. WBC bump.   07/10/2021:  high oxygen requirement, d dimer checked, 7.34, CTA chest ordered: positive for bilateral PE with right heart strain.  IV heparin started. Likely transfer to [facility] for IR to evaluate for possible intervention.    07/11/2021: pt remains dyspneic but no worse than yesterday.  Waiting to transfer to [facility] for IR evaluation.    Assessment and Plan: * Severe sepsis ([place]) Secondary to  right-sided pneumonia-community-acquired Lactic acidosis has RESOLVED Procalcitonin is reassuring at 0.30  Continue Rocephin and azithromycin for now Monitor cultures Urine Legionella (pending) and strep pneumonia (negative) COVID and flu swab negative  Bilateral pulmonary embolism (HCC) Right heart strain on CT scan Echo completed Venous dopplers ordered of LEs I reached out to Dr. Halford Chessman to evaluate need for further intervention.  IV heparin infusion per pharm D ordered.   Patient and daughter updated at bedside.   "PE Focused" orderset bundle Dr. Halford Chessman arranged for transfer to [facility] for IR evaluation regarding intervention   Acute respiratory failure with hypoxia [facility]) Initially thought secondary to pneumonia but likely related to bilateral PE  He is treated with BiPAP therapy Appreciate PCCM evaluation and recommendations.  I discussed with Dr. Halford Chessman, due to the advanced PE He recommended transfer to [facility] for IR eval regarding intervention for PE.   Leukocytosis - WBC trending down.  - clinical improvement is reassuring.    Pressure injury of skin Pressure Injury 06/23/2021 Buttocks Left Stage 2 -  Partial thickness loss of dermis presenting as a shallow open injury with a red, pink wound bed without slough. (Active)  06/29/2021 1308  Location: Buttocks  Location Orientation: Left  Staging: Stage 2 -  Partial thickness loss of dermis presenting as a shallow open injury with a red, pink wound bed without slough.  Wound Description (  Comments):   Present on Admission: Yes     Pressure Injury 06/21/2021 Buttocks Right Stage 2 -  Partial thickness loss of dermis presenting as a shallow open injury with a red, pink wound bed without slough. (Active)  07/03/2021 1309  Location: Buttocks  Location Orientation: Right  Staging: Stage 2 -  Partial thickness loss of dermis presenting as a shallow open injury with a red, pink wound bed without slough.  Wound Description (Comments):   Present on  Admission: Yes    AKI (acute kidney injury) ([place]) Baseline creatinine 0.8-0.9 Avoid nephrotoxic agents Monitor strict I's and O's IV fluid held due to findings of pulmonary edema  Elevated troponin Likely related to demand with sepsis physiology Continue to monitor 2D echocardiogram 07/08/21: 1. Left ventricular ejection fraction, by estimation, is 60 to 65%. The left ventricle has normal function. The left ventricle has no regional wall motion abnormalities. There is mild concentric left ventricular hypertrophy. Left ventricular diastolic parameters are consistent with Grade II diastolic dysfunction (pseudonormalization). There is the interventricular septum is flattened in diastole ('D' shaped left ventricle), consistent with right ventricular volume overload.   2. Right ventricular systolic function is mildly reduced. The right ventricular size is dilated. There is moderately elevated pulmonary artery systolic pressure. RV mid wall not well visualized.   3. The mitral valve is grossly normal. No evidence of mitral valve regurgitation. No evidence of mitral stenosis.   4. The aortic valve is tricuspid. Aortic valve regurgitation is not visualized. No aortic stenosis is present.   Essential hypertension Hold home lisinopril given AKI Currently with softer blood pressure readings  Depression Stable.   GERD (gastroesophageal reflux disease) Continue on IV PPI   Community acquired pneumonia Continue antibiotics as ordered.    DVT prophylaxis: [place] heparin Code Status: full  Family Communication:  Disposition: Status is: Inpatient Remains inpatient appropriate because: IV heparin infusion required  Consultants:  PCCM  Procedures:   Antimicrobials:    Subjective: He is having anxiety and shortness of breath symptoms.  Not sleeping well, no chest pain, occasional palpitations.  Objective: Vitals:   07/11/21 0800 07/11/21 0900 07/11/21 0955 07/11/21 1000  BP: (!) 172/92  (!)  106/55   Pulse: (!) 103 98 78 84  Resp: '17 15 12 '$ (!) 29  Temp:      TempSrc:      SpO2: 95% 94% 98% 92%  Weight:      Height:        Intake/Output Summary (Last 24 hours) at 07/11/2021 1041 Last data filed at 07/11/2021 1024 Gross per 24 hour  Intake 1498.64 ml  Output 1300 ml  Net 198.64 ml   Filed Weights   07/09/21 0446 07/10/21 0500 07/11/21 0500  Weight: 93.6 kg 95.1 kg 97.1 kg   Examination:  General exam: chronically ill appearing male, awake, alert, on high flow [place], Appears calm and comfortable  Respiratory system: poor air movement, diminished bs right base, moderate increased work of breathing.  Cardiovascular system: normal S1 & S2 heard. No JVD, murmurs, rubs, gallops or clicks. No pedal edema. Gastrointestinal system: Abdomen is nondistended, soft and nontender. No organomegaly or masses felt. Normal bowel sounds heard. Central nervous system: Alert and oriented. No focal neurological deficits. Extremities: Symmetric 5 x 5 power. Skin: No rashes, lesions or ulcers. Psychiatry: Judgement and insight appear poor. Mood & affect appropriate.   Data Reviewed: I have personally reviewed following labs and imaging studies  CBC: Recent Labs  Lab 06/12/2021 0950 07/08/21  [id number] 07/09/21 0356 07/10/21 0405 07/11/21 0343  WBC 14.1* 18.8* 25.5* 14.5* 15.1*  NEUTROABS 11.2*  --  21.1* 11.7* 11.7*  HGB 11.9* 11.7* 11.1* 10.8* 10.8*  HCT 37.4* 36.6* 36.3* 34.8* 36.4*  MCV 87.4 86.1 86.6 88.3 90.3  PLT 234 182 298 276 720    Basic Metabolic Panel: Recent Labs  Lab 06/12/2021 0950 07/08/21 0339 07/09/21 0356 07/10/21 0405 07/11/21 0343  NA 143 143 147* 151* 148*  K 3.4* 4.0 3.2* 3.7 4.0  CL 112* 114* 115* 117* 113*  CO2 15* 15* 19* 23 20*  GLUCOSE 183* 148* 124* 142* 141*  BUN 43* 39* 42* 37* 34*  CREATININE 2.18* 1.66* 1.51* 1.33* 1.62*  CALCIUM 8.1* 8.3* 8.8* 8.3* 8.0*  MG  --  2.2 1.9 1.9  --     CBG: Recent Labs  Lab 06/18/2021 2304 07/08/21 0345  07/08/21 0720 07/08/21 1144 07/08/21 1550  GLUCAP 169* 143* 118* 152* 121*    Recent Results (from the past 240 hour(s))  Resp Panel by RT-PCR (Flu A&B, Covid) Nasopharyngeal Swab     Status: None   Collection Time: 07/06/2021  9:28 AM   Specimen: Nasopharyngeal Swab; Nasopharyngeal(NP) swabs in vial transport medium  Result Value Ref Range Status   SARS Coronavirus 2 by RT PCR NEGATIVE NEGATIVE Final    Comment: (NOTE) SARS-CoV-2 target nucleic acids are NOT DETECTED.  The SARS-CoV-2 RNA is generally detectable in upper respiratory specimens during the acute phase of infection. The lowest concentration of SARS-CoV-2 viral copies this assay can detect is 138 copies/mL. A negative result does not preclude SARS-Cov-2 infection and should not be used as the sole basis for treatment or other patient management decisions. A negative result may occur with  improper specimen collection/handling, submission of specimen other than nasopharyngeal swab, presence of viral mutation(s) within the areas targeted by this assay, and inadequate number of viral copies(<138 copies/mL). A negative result must be combined with clinical observations, patient history, and epidemiological information. The expected result is Negative.  Fact Sheet for Patients:  EntrepreneurPulse.com.au  Fact Sheet for Healthcare Providers:  IncredibleEmployment.be  This test is no t yet approved or cleared by the [place] FDA and  has been authorized for detection and/or diagnosis of SARS-CoV-2 by FDA under an Emergency Use Authorization (EUA). This EUA will remain  in effect (meaning this test can be used) for the duration of the COVID-19 declaration under Section 564(b)(1) of the Act, 21 U.S.C.section 360bbb-3(b)(1), unless the authorization is terminated  or revoked sooner.       Influenza A by PCR NEGATIVE NEGATIVE Final   Influenza B by PCR NEGATIVE NEGATIVE Final     Comment: (NOTE) The Xpert Xpress SARS-CoV-2/FLU/RSV plus assay is intended as an aid in the diagnosis of influenza from Nasopharyngeal swab specimens and should not be used as a sole basis for treatment. Nasal washings and aspirates are unacceptable for Xpert Xpress SARS-CoV-2/FLU/RSV testing.  Fact Sheet for Patients: EntrepreneurPulse.com.au  Fact Sheet for Healthcare Providers: IncredibleEmployment.be  This test is not yet approved or cleared by the [place] FDA and has been authorized for detection and/or diagnosis of SARS-CoV-2 by FDA under an Emergency Use Authorization (EUA). This EUA will remain in effect (meaning this test can be used) for the duration of the COVID-19 declaration under Section 564(b)(1) of the Act, 21 U.S.C. section 360bbb-3(b)(1), unless the authorization is terminated or revoked.  Performed at [facility], [address] [place], [place] [address]   Blood  Culture (routine x 2)     Status: None (Preliminary result)   Collection Time: 06/20/2021  9:31 AM   Specimen: BLOOD  Result Value Ref Range Status   Specimen Description BLOOD BLOOD LEFT ARM  Final   Special Requests   Final    BOTTLES DRAWN AEROBIC AND ANAEROBIC Blood Culture adequate volume   Culture   Final    NO GROWTH 4 DAYS Performed at [facility], [address] [place], [place] [address]    Report Status PENDING  Incomplete  Blood Culture (routine x 2)     Status: None (Preliminary result)   Collection Time: 06/20/2021  9:51 AM   Specimen: BLOOD  Result Value Ref Range Status   Specimen Description BLOOD RIGHT ANTECUBITAL  Final   Special Requests   Final    BOTTLES DRAWN AEROBIC AND ANAEROBIC Blood Culture adequate volume   Culture   Final    NO GROWTH 4 DAYS Performed at [facility], [address], [place], [place] [address]    Report Status PENDING  Incomplete  MRSA Next Gen by PCR, Nasal     Status: None   Collection Time: 06/20/2021  1:11  PM   Specimen: Nasal Mucosa; Nasal Swab  Result Value Ref Range Status   MRSA by PCR Next Gen NOT DETECTED NOT DETECTED Final    Comment: (NOTE) The GeneXpert MRSA Assay (FDA approved for NASAL specimens only), is one component of a comprehensive MRSA colonization surveillance program. It is not intended to diagnose MRSA infection nor to guide or monitor treatment for MRSA infections. Test performance is not FDA approved in patients less than 71 years old. Performed at [facility], [address], [place], [place] [address]      Radiology Studies: CT Angio Chest Pulmonary Embolism (PE) W or WO Contrast  Result Date: 07/10/2021 CLINICAL DATA:  Severe sepsis, hypoxemia, tachypnea EXAM: CT ANGIOGRAPHY CHEST WITH CONTRAST TECHNIQUE: Multidetector CT imaging of the chest was performed using the standard protocol during bolus administration of intravenous contrast. Multiplanar CT image reconstructions and MIPs were obtained to evaluate the vascular anatomy. RADIATION DOSE REDUCTION: This exam was performed according to the departmental dose-optimization program which includes automated exposure control, adjustment of the mA and/or kV according to patient size and/or use of iterative reconstruction technique. CONTRAST:  62m OMNIPAQUE IOHEXOL 350 MG/ML SOLN COMPARISON:  None. FINDINGS: Cardiovascular: Satisfactory opacification of the pulmonary arteries to the segmental level. Nonocclusive pulmonary emboli involving the bifurcation of the main pulmonary artery extending into the left upper and lower lobar, segmental and subsegmental pulmonary arteries. Small pulmonary embolus in the right upper lobar pulmonary artery extending into the segmental and subsegmental branches. Small pulmonary artery emboli in the right lower lobe segmental branches. Normal heart size. No pericardial effusion. RV/LV ratio 1.3. Mediastinum/Nodes: No enlarged mediastinal, hilar, or axillary lymph nodes. Thyroid gland, trachea, and  esophagus demonstrate no significant findings. Lungs/Pleura: Bilateral perihilar interstitial thickening and ground-glass opacities concerning for pulmonary edema. Small right pleural effusion. Trace left pleural effusion. No pneumothorax. Upper Abdomen: No acute abnormality. Reflux of contrast into the IVC and hepatic veins. Musculoskeletal: No acute osseous abnormality. No aggressive osseous lesion. Review of the MIP images confirms the above findings. IMPRESSION: 1. Nonocclusive bilateral pulmonary emboli most severe at the bifurcation of the main pulmonary artery extending into the left upper and lower lobar, segmental and subsegmental pulmonary arteries. Positive for acute PE with CT evidence of right heart strain (RV/LV Ratio = 1.3) consistent with at  least submassive (intermediate risk) PE. The presence of right heart strain has been associated with an increased risk of morbidity and mortality. Please refer to the "PE Focused" order set in EPIC. 2. Bilateral perihilar interstitial thickening and ground-glass opacities concerning for pulmonary edema. 3. Small right pleural effusion and trace left pleural effusion. 4. Reflux of contrast into the IVC and hepatic veins, suggestive of right heart dysfunction. Critical Value/emergent results were called by telephone at the time of interpretation on 07/10/2021 at 12:10 pm to provider [facility] , who verbally acknowledged these results. Electronically Signed   By: Kathreen Devoid M.D.   On: 07/10/2021 12:10   US Venous Img Lower Bilateral (DVT)  Result Date: 07/10/2021 CLINICAL DATA:  Acute pulmonary embolism. EXAM: BILATERAL LOWER EXTREMITY VENOUS DOPPLER ULTRASOUND TECHNIQUE: Gray-scale sonography with graded compression, as well as color Doppler and duplex ultrasound were performed to evaluate the lower extremity deep venous systems from the level of the common femoral vein and including the common femoral, femoral, profunda femoral, popliteal and calf veins  including the posterior tibial, peroneal and gastrocnemius veins when visible. The superficial great saphenous vein was also interrogated. Spectral Doppler was utilized to evaluate flow at rest and with distal augmentation maneuvers in the common femoral, femoral and popliteal veins. COMPARISON:  None. FINDINGS: RIGHT LOWER EXTREMITY Common Femoral Vein: No evidence of thrombus. Normal compressibility, respiratory phasicity and response to augmentation. Saphenofemoral Junction: No evidence of thrombus. Normal compressibility and flow on color Doppler imaging. Profunda Femoral Vein: No evidence of thrombus. Normal compressibility and flow on color Doppler imaging. Femoral Vein: No evidence of thrombus. Normal compressibility, respiratory phasicity and response to augmentation. Popliteal Vein: No evidence of thrombus. Normal compressibility, respiratory phasicity and response to augmentation. Calf Veins: Calf veins were not evaluated due to sequential compression devices. Superficial Great Saphenous Vein: No evidence of thrombus. Normal compressibility. Venous Reflux:  None. Other Findings: No evidence of superficial thrombophlebitis or abnormal fluid collection. LEFT LOWER EXTREMITY Common Femoral Vein: No evidence of thrombus. Normal compressibility, respiratory phasicity and response to augmentation. Saphenofemoral Junction: No evidence of thrombus. Normal compressibility and flow on color Doppler imaging. Profunda Femoral Vein: No evidence of thrombus. Normal compressibility and flow on color Doppler imaging. Femoral Vein: No evidence of thrombus. Normal compressibility, respiratory phasicity and response to augmentation. Popliteal Vein: No evidence of thrombus. Normal compressibility, respiratory phasicity and response to augmentation. Calf Veins: Calf veins were not evaluated due to sequential compression devices. Superficial Great Saphenous Vein: No evidence of thrombus. Normal compressibility. Venous Reflux:   None. Other Findings: No evidence of superficial thrombophlebitis or abnormal fluid collection. IMPRESSION: No visualized deep venous thrombosis in either lower extremity. Calf veins could not be evaluated bilaterally due to sequential compression devices. Electronically Signed   By: Aletta Edouard M.D.   On: 07/10/2021 13:36   DG CHEST PORT 1 VIEW  Result Date: 07/10/2021 CLINICAL DATA:  Sepsis, respiratory distress, pneumonia, leukocytosis EXAM: PORTABLE CHEST 1 VIEW COMPARISON:  Portable exam 0443 hours compared to 07/08/2021 FINDINGS: Enlargement of cardiac silhouette. Mediastinal contours and pulmonary vascularity normal. Increased opacity RIGHT mid lung and LEFT base question pneumonia. No pleural effusion or pneumothorax. Bones demineralized. IMPRESSION: Increased opacity RIGHT perihilar and LEFT base suspicious for pneumonia. Electronically Signed   By: Lavonia Dana M.D.   On: 07/10/2021 08:23    Scheduled Meds:  Chlorhexidine Gluconate Cloth  6 each Topical Q0600   gabapentin  200 mg Oral TID   metoprolol tartrate  25 mg  Oral BID   pantoprazole  40 mg Oral Daily   Continuous Infusions:  azithromycin Stopped (07/10/21 1111)   heparin Stopped (07/11/21 1021)     LOS: 4 days   Critical Care Procedure Note Authorized and Performed by: Murvin Natal MD  Total Critical Care time:  50 mins Due to a high probability of clinically significant, life threatening deterioration, the patient required my highest level of preparedness to intervene emergently and I personally spent this critical care time directly and personally managing the patient.  This critical care time included obtaining a history; examining the patient, pulse oximetry; ordering and review of studies; arranging urgent treatment with development of a management plan; evaluation of patient's response of treatment; frequent reassessment; and discussions with other providers.  This critical care time was performed to assess and manage  the high probability of imminent and life threatening deterioration that could result in multi-organ failure.  It was exclusive of separately billable procedures and treating other patients and teaching time.    Irwin Brakeman, MD How to contact the [facility] Attending or Consulting provider [place] or covering provider during after hours [place], for this patient?  Check the care team in [facility] and look for a) attending/consulting [facility] provider listed and b) the [facility] team listed Log into [email or web address] and use [place]'s universal password to access. If you do not have the password, please contact the hospital operator. Locate the [facility] provider you are looking for under [facility] and page to a number that you can be directly reached. If you still have difficulty reaching the provider, please page the [facility] (Director on Call) for the Hospitalists listed on amion for assistance.  07/11/2021, 10:41 AM

## 2021-07-11 NOTE — Progress Notes (Addendum)
NAME:  Jason Yin., MRN:  [id number], DOB:  January 05, 1946, LOS: 4 ADMISSION DATE:  06/17/2021, CONSULTATION DATE:  2/28 REFERRING MD:  Wynetta Emery, CHIEF COMPLAINT:  Dyspnea   History of Present Illness:  76 y/o male presented to [facility] on 2/28 after his family noted that his oxygenation was lower than normal at home.  He says that he didn't have any dyspnea or chest pain at the time.  He was admitted to [facility], diagnosed with pneumonia and sepsis.  He required BIPAP for several days, then has transitioned to high flow nasal cannula.  He moved to [facility] on 3/4 because his oxygenation wasn't improving and a CT angiogram was performed that showed a saddle PE.   He is complaining of abdominal, back and leg pain today but denies dyspnea.  He has not had a cough or mucus production.  He denies chest pain.    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 on chronic opiate Hypertension GERD Chronic knee pain  Significant Hospital Events: Including procedures, antibiotic start and stop dates in addition to other pertinent events   2/28 admission, treatment for pneumonia with ceftriaxone/azithro, elevated troponin, treated with BIPAP 3/1 TTE> LVEF 55-60%, mild LVH, D shaped septum, evidence of RV overload 3/3 found to have saddle PE on CT angio, patchy ggo bilaterally; negative lower ext doppler, started on heparin 3/4 moved to [facility] for persistent hypoxemia, remains on HHF  Interim History / Subjective:  As above  Objective   Blood pressure (!) 106/55, pulse 84, temperature 97.7 F (36.5 C), temperature source Oral, resp. rate (!) 29, height '5\' 5"'$  (1.651 m), weight 97.1 kg, SpO2 92 %.        Intake/Output Summary (Last 24 hours) at 07/11/2021 1200 Last data filed at 07/11/2021 1024 Gross per 24 hour  Intake 1498.64 ml  Output 1300 ml  Net 198.64 ml   Filed Weights   07/09/21 0446 07/10/21 0500 07/11/21 0500  Weight: 93.6 kg 95.1 kg 97.1 kg     Examination:  General:  Chronically ill appearing, resting comfortably in bed HENT: NCAT OP clear PULM: CTA B, normal effort CV: tachy, regular, no mgr GI: BS+, soft, nontender MSK: normal bulk and tone Neuro: awake, alert, no distress, MAEW   Resolved Hospital Problem list   Pneumonia  Assessment & Plan:  Critically ill due to Acute respiratory failure with hypoxemia due to saddle PE, submassive with RV strain Elevated troponin due to RV strain Admit to ICU tele Heated high flow oxygen Heparin infusion per pharmacy IR consult Discussed with IR.  If he needs a catheter directed procedure would favor thrombectomy given the size, location.  However as he has not yet received 24 hours of anticoagulation treatment I would favor close observation in the ICU on heparin overnight.  If no improvement by tomorrow then would proceed.  Holding off on systemic lytics given the significantly elevated BP readings in the last 24 hours. Continue metoprolol for now  Chronic pain from prior trauma, ruptured disc Restarting Butrans patch Prn hydrocodone gabapentin    Best Practice (right click and "Reselect all SmartList Selections" daily)   Diet/type: Regular consistency (see orders) DVT prophylaxis: systemic heparin GI prophylaxis: N/A Lines: N/A Foley:  N/A Code Status:  DNR Last date of multidisciplinary goals of care discussion [3/4 with Nat Lowenthal: he says that he does not want to go on life support or receive CPR in the event of a cardiac  arrest.  He has had multiple difficult surgeries and has been hospitalized a lot in the past and feels strongly about this.]  Labs   CBC: Recent Labs  Lab 06/19/2021 0950 07/08/21 0631 07/09/21 0356 07/10/21 0405 07/11/21 0343  WBC 14.1* 18.8* 25.5* 14.5* 15.1*  NEUTROABS 11.2*  --  21.1* 11.7* 11.7*  HGB 11.9* 11.7* 11.1* 10.8* 10.8*  HCT 37.4* 36.6* 36.3* 34.8* 36.4*  MCV 87.4 86.1 86.6 88.3 90.3  PLT 234 182 298 276 256    Basic  Metabolic Panel: Recent Labs  Lab 06/24/2021 0950 07/08/21 0339 07/09/21 0356 07/10/21 0405 07/11/21 0343  NA 143 143 147* 151* 148*  K 3.4* 4.0 3.2* 3.7 4.0  CL 112* 114* 115* 117* 113*  CO2 15* 15* 19* 23 20*  GLUCOSE 183* 148* 124* 142* 141*  BUN 43* 39* 42* 37* 34*  CREATININE 2.18* 1.66* 1.51* 1.33* 1.62*  CALCIUM 8.1* 8.3* 8.8* 8.3* 8.0*  MG  --  2.2 1.9 1.9  --    GFR: Estimated Creatinine Clearance: 40.9 mL/min (A) (by C-G formula based on SCr of 1.62 mg/dL (H)). Recent Labs  Lab 06/22/2021 0950 06/17/2021 1100 07/08/21 0339 07/08/21 0631 07/08/21 1116 07/09/21 0356 07/10/21 0405 07/11/21 0343  PROCALCITON <0.10  --  0.30  --   --  0.23 0.18 0.21  WBC 14.1*  --   --  18.8*  --  25.5* 14.5* 15.1*  LATICACIDVEN 3.2* 3.6*  --   --  1.7  --   --   --     Liver Function Tests: Recent Labs  Lab 07/01/2021 0950 07/08/21 0339 07/09/21 0356 07/10/21 0405 07/11/21 0343  AST '24 30 25 21 19  '$ ALT '15 21 22 23 22  '$ ALKPHOS 108 104 113 123 130*  BILITOT 0.9 0.7 0.1* 0.1* 0.4  PROT 7.3 6.8 7.0 6.6 6.8  ALBUMIN 3.3* 3.1* 3.2* 3.0* 3.0*   No results for input(s): LIPASE, AMYLASE in the last 168 hours. No results for input(s): AMMONIA in the last 168 hours.  ABG    Component Value Date/Time   PHART 7.27 (L) 07/06/2021 0947   PCO2ART 31 (L) 07/03/2021 0947   PO2ART 59 (L) 07/02/2021 0947   HCO3 14.4 (L) 07/05/2021 0947   TCO2 25.9 03/01/2011 0429   ACIDBASEDEF 11.5 (H) 07/01/2021 0947   O2SAT 88.7 06/27/2021 0947     Coagulation Profile: Recent Labs  Lab 06/21/2021 0950 07/10/21 1237  INR 1.1 1.1    Cardiac Enzymes: Recent Labs  Lab 06/27/2021 1104  CKTOTAL 62    HbA1C: Hgb A1c MFr Bld  Date/Time Value Ref Range Status  03/04/2011 02:00 PM 5.5 <5.7 % Final    Comment:    (NOTE)                                                                       According to the ADA Clinical Practice Recommendations for 2011, when HbA1c is used as a screening test:   >=6.5%   Diagnostic of Diabetes Mellitus           (if abnormal result is confirmed) 5.7-6.4%   Increased risk of developing Diabetes Mellitus References:Diagnosis and Classification of Diabetes Mellitus,Diabetes [id number],9373,42(AJGOT 1):S62-S69 and Standards of Medical Care in  Diabetes - 2011,Diabetes Care,2011,34 (Suppl 1):S11-S61.    CBG: Recent Labs  Lab 06/26/2021 2304 07/08/21 0345 07/08/21 0720 07/08/21 1144 07/08/21 1550  GLUCAP 169* 143* 118* 152* 121*    Review of Systems:   Gen: Denies fever, chills, weight change, fatigue, night sweats HEENT: Denies blurred vision, double vision, hearing loss, tinnitus, sinus congestion, rhinorrhea, sore throat, neck stiffness, dysphagia PULM: per HPI CV: Denies chest pain, edema, orthopnea, paroxysmal nocturnal dyspnea, palpitations GI: Denies abdominal pain, nausea, vomiting, diarrhea, hematochezia, melena, constipation, change in bowel habits GU: Denies dysuria, hematuria, polyuria, oliguria, urethral discharge Endocrine: Denies hot or cold intolerance, polyuria, polyphagia or appetite change Derm: Denies rash, dry skin, scaling or peeling skin change Heme: Denies easy bruising, bleeding, bleeding gums Neuro: Denies headache, numbness, weakness, slurred speech, loss of memory or consciousness   Past Medical History:  He,  has a past medical history of Acid reflux, Chronic knee pain, Depression, Hypertension, and Ruptured lumbar disc.   Surgical History:   Past Surgical History:  Procedure Laterality Date   KNEE SURGERY     LAPAROTOMY  02/18/2011   Procedure: EXPLORATORY LAPAROTOMY;  Surgeon: Jamesetta So;  Location: [facility];  Service: General;  Laterality: N/A;  Gastrorraphy     Social History:   reports that he has never smoked. He has never used smokeless tobacco. He reports that he does not drink alcohol and does not use drugs.   Family History:  His family history is not on file.   Allergies Allergies   Allergen Reactions   Eggs Or Egg-Derived Products Hives   Influenza Virus Vacc Split Pf Hives   Dye Fdc Red [Red Dye]      Home Medications  Prior to Admission medications   Medication Sig Start Date End Date Taking? Authorizing Provider  gabapentin (NEURONTIN) 300 MG capsule Take 1 capsule (300 mg total) by mouth 3 (three) times daily. 06/08/19  Yes Mercy Riding, MD  lisinopril (PRINIVIL,ZESTRIL) 5 MG tablet Take 5 mg daily by mouth.   Yes [provider]  pantoprazole (PROTONIX) 40 MG tablet Take 40 mg by mouth daily.   Yes [provider]  docusate sodium (COLACE) 100 MG capsule Take 1 capsule (100 mg total) by mouth every 12 (twelve) hours. Patient taking differently: Take 100 mg by mouth 2 (two) times daily as needed for mild constipation or moderate constipation.  08/30/16   Mesner, [name], MD     Critical care time: 45 minutes     Roselie Awkward, MD [facility] Pager: [phone] Cell: [phone] After 7:00 pm call Elink  [phone]

## 2021-07-11 NOTE — Progress Notes (Signed)
[facility] ? ?Mr. Memoli tells me that he has thought about his Code status decision some more.  He says that he has a number of children and grandchildren he wants to see for years to come.  He would prefer for [place] to attempt CPR and life support for a time limited trial (1 week). ? ?Will change code status back to full code. ? ?Roselie Awkward, MD ?[facility] ?Pager: [phone] ?Cell: [phone] ?After 7:00 pm call Elink  [phone] ? ?

## 2021-07-11 NOTE — Consult Note (Signed)
Chief Complaint: Patient was seen in consultation today for consideration of Pulmonary embolus thrombectomy procedure in IR Chief Complaint  Patient presents with   Respiratory Distress   at the request of Dr Despina Hick   Supervising Physician: Juliet Rude  Patient Status: [facility] - In-pt  History of Present Illness: Jason Holt. is a 76 y.o. male   Transferred from [place] Was seen by EMS at home 2/7 for SOB; satting in 80s---declined to go to ED EMS called back to home next day sats in 70s and more SOB To [facility] and diagnosed with PNA and sepsis Treated and placed on Bipap Oxygen requirement continued to increase Was seen by PCCM there Tx to [facility] for further treatment and evaluation CTA revealing Bilat PE with Rt heart strain  IMPRESSION: 1. Nonocclusive bilateral pulmonary emboli most severe at the bifurcation of the main pulmonary artery extending into the left upper and lower lobar, segmental and subsegmental pulmonary arteries. Positive for acute PE with CT evidence of right heart strain (RV/LV Ratio = 1.3) consistent with at least submassive (intermediate risk) PE. The presence of right heart strain has been associated with an increased risk of morbidity and mortality. Please refer to the "PE Focused" order set in EPIC. 2. Bilateral perihilar interstitial thickening and ground-glass opacities concerning for pulmonary edema. 3. Small right pleural effusion and trace left pleural effusion. 4. Reflux of contrast into the IVC and hepatic veins, suggestive of right heart dysfunction.  Bilat doppler neg for DVT  Echo: Left ventricular ejection fraction, by estimation, is 60 to 65%. The left ventricle has normal function. The left ventricle has no regional wall motion abnormalities. There is mild concentric left ventricular hypertrophy. Left ventricular diastolic parameters are consistent with Grade II diastolic dysfunction (pseudonormalization). There is  the interventricular septum is flattened in diastole ('D' shaped left ventricle), consistent with right ventricular volume overload. 1. Right ventricular systolic function is mildly reduced. The right ventricular size is dilated. There is moderately elevated pulmonary artery systolic pressure. RV mid wall not well visualized. 2. The mitral valve is grossly normal. No evidence of mitral valve regurgitation. No evidence of mitral stenosis. 3. The aortic valve is tricuspid. Aortic valve regurgitation is not visualized. No aortic stenosis is present.   Started on Heparin this am Does feel some better per pt Less SOB He is on HFNC; sat 91%  Dr Lake Bells has asked IR to be aware of pt and consider PR thrombectomy if need  Dr Denna Haggard has seen and examined pt   Past Medical History:  Diagnosis Date   Acid reflux    Chronic knee pain    Depression    Hypertension    Ruptured lumbar disc     Past Surgical History:  Procedure Laterality Date   KNEE SURGERY     LAPAROTOMY  02/18/2011   Procedure: EXPLORATORY LAPAROTOMY;  Surgeon: Jamesetta So;  Location: [facility];  Service: General;  Laterality: N/A;  Gastrorraphy    Allergies: Eggs or egg-derived products, Influenza virus vacc split pf, and Dye fdc red [red dye]  Medications: Prior to Admission medications   Medication Sig Start Date End Date Taking? Authorizing Provider  buprenorphine (BUTRANS) 5 MCG/HR PTWK Place 1 patch onto the skin once a week.   Yes [provider]  gabapentin (NEURONTIN) 300 MG capsule Take 1 capsule (300 mg total) by mouth 3 (three) times daily. 06/08/19  Yes Mercy Riding, MD  lisinopril (PRINIVIL,ZESTRIL) 5 MG tablet  Take 5 mg daily by mouth.   Yes [provider]  pantoprazole (PROTONIX) 40 MG tablet Take 40 mg by mouth daily.   Yes [provider]  docusate sodium (COLACE) 100 MG capsule Take 1 capsule (100 mg total) by mouth every 12 (twelve) hours. Patient taking  differently: Take 100 mg by mouth 2 (two) times daily as needed for mild constipation or moderate constipation.  08/30/16   Mesner, [name], MD     History reviewed. No pertinent family history.  Social History   Socioeconomic History   Marital status: Widowed    Spouse name: Not on file   Number of children: Not on file   Years of education: Not on file   Highest education level: Not on file  Occupational History   Not on file  Tobacco Use   Smoking status: Never   Smokeless tobacco: Never  Substance and Sexual Activity   Alcohol use: No   Drug use: No   Sexual activity: Not Currently  Other Topics Concern   Not on file  Social History Narrative   Not on file   Social Determinants of Health   Financial Resource Strain: Not on file  Food Insecurity: Not on file  Transportation Needs: Not on file  Physical Activity: Not on file  Stress: Not on file  Social Connections: Not on file    Review of Systems: A 12 point ROS discussed and pertinent positives are indicated in the HPI above.  All other systems are negative.  Review of Systems  Constitutional:  Positive for activity change. Negative for fever and unexpected weight change.  Respiratory:  Positive for shortness of breath. Negative for wheezing.   Cardiovascular:  Negative for chest pain.  Gastrointestinal:  Negative for abdominal pain.  Neurological:  Positive for weakness.  Psychiatric/Behavioral:  Negative for behavioral problems and confusion.    Vital Signs: BP 121/84 (BP Location: Left Wrist)    Pulse 88    Temp 97.7 F (36.5 C) (Oral)    Resp 20    Ht '5\' 5"'$  (1.651 m)    Wt 214 lb 1.1 oz (97.1 kg)    SpO2 91%    BMI 35.62 kg/m   Physical Exam Vitals reviewed.  HENT:     Mouth/Throat:     Mouth: Mucous membranes are moist.  Cardiovascular:     Rate and Rhythm: Normal rate and regular rhythm.     Heart sounds: Normal heart sounds.  Pulmonary:     Effort: Pulmonary effort is normal.     Breath sounds:  Normal breath sounds. No wheezing.  Abdominal:     Palpations: Abdomen is soft.     Tenderness: There is no abdominal tenderness.  Musculoskeletal:        General: Normal range of motion.     Right lower leg: No edema.     Left lower leg: No edema.  Skin:    General: Skin is warm.  Neurological:     Mental Status: He is alert and oriented to person, place, and time.  Psychiatric:        Behavior: Behavior normal.    Imaging: CT Angio Chest Pulmonary Embolism (PE) W or WO Contrast  Result Date: 07/10/2021 CLINICAL DATA:  Severe sepsis, hypoxemia, tachypnea EXAM: CT ANGIOGRAPHY CHEST WITH CONTRAST TECHNIQUE: Multidetector CT imaging of the chest was performed using the standard protocol during bolus administration of intravenous contrast. Multiplanar CT image reconstructions and MIPs were obtained to evaluate the vascular anatomy.  RADIATION DOSE REDUCTION: This exam was performed according to the departmental dose-optimization program which includes automated exposure control, adjustment of the mA and/or kV according to patient size and/or use of iterative reconstruction technique. CONTRAST:  87m OMNIPAQUE IOHEXOL 350 MG/ML SOLN COMPARISON:  None. FINDINGS: Cardiovascular: Satisfactory opacification of the pulmonary arteries to the segmental level. Nonocclusive pulmonary emboli involving the bifurcation of the main pulmonary artery extending into the left upper and lower lobar, segmental and subsegmental pulmonary arteries. Small pulmonary embolus in the right upper lobar pulmonary artery extending into the segmental and subsegmental branches. Small pulmonary artery emboli in the right lower lobe segmental branches. Normal heart size. No pericardial effusion. RV/LV ratio 1.3. Mediastinum/Nodes: No enlarged mediastinal, hilar, or axillary lymph nodes. Thyroid gland, trachea, and esophagus demonstrate no significant findings. Lungs/Pleura: Bilateral perihilar interstitial thickening and ground-glass  opacities concerning for pulmonary edema. Small right pleural effusion. Trace left pleural effusion. No pneumothorax. Upper Abdomen: No acute abnormality. Reflux of contrast into the IVC and hepatic veins. Musculoskeletal: No acute osseous abnormality. No aggressive osseous lesion. Review of the MIP images confirms the above findings. IMPRESSION: 1. Nonocclusive bilateral pulmonary emboli most severe at the bifurcation of the main pulmonary artery extending into the left upper and lower lobar, segmental and subsegmental pulmonary arteries. Positive for acute PE with CT evidence of right heart strain (RV/LV Ratio = 1.3) consistent with at least submassive (intermediate risk) PE. The presence of right heart strain has been associated with an increased risk of morbidity and mortality. Please refer to the "PE Focused" order set in EPIC. 2. Bilateral perihilar interstitial thickening and ground-glass opacities concerning for pulmonary edema. 3. Small right pleural effusion and trace left pleural effusion. 4. Reflux of contrast into the IVC and hepatic veins, suggestive of right heart dysfunction. Critical Value/emergent results were called by telephone at the time of interpretation on 07/10/2021 at 12:10 pm to [facility], who verbally acknowledged these results. Electronically Signed   By: HKathreen DevoidM.D.   On: 07/10/2021 12:10   UK[place]Venous Img Lower Bilateral (DVT)  Result Date: 07/10/2021 CLINICAL DATA:  Acute pulmonary embolism. EXAM: BILATERAL LOWER EXTREMITY VENOUS DOPPLER ULTRASOUND TECHNIQUE: Gray-scale sonography with graded compression, as well as color Doppler and duplex ultrasound were performed to evaluate the lower extremity deep venous systems from the level of the common femoral vein and including the common femoral, femoral, profunda femoral, popliteal and calf veins including the posterior tibial, peroneal and gastrocnemius veins when visible. The superficial great saphenous vein was  also interrogated. Spectral Doppler was utilized to evaluate flow at rest and with distal augmentation maneuvers in the common femoral, femoral and popliteal veins. COMPARISON:  None. FINDINGS: RIGHT LOWER EXTREMITY Common Femoral Vein: No evidence of thrombus. Normal compressibility, respiratory phasicity and response to augmentation. Saphenofemoral Junction: No evidence of thrombus. Normal compressibility and flow on color Doppler imaging. Profunda Femoral Vein: No evidence of thrombus. Normal compressibility and flow on color Doppler imaging. Femoral Vein: No evidence of thrombus. Normal compressibility, respiratory phasicity and response to augmentation. Popliteal Vein: No evidence of thrombus. Normal compressibility, respiratory phasicity and response to augmentation. Calf Veins: Calf veins were not evaluated due to sequential compression devices. Superficial Great Saphenous Vein: No evidence of thrombus. Normal compressibility. Venous Reflux:  None. Other Findings: No evidence of superficial thrombophlebitis or abnormal fluid collection. LEFT LOWER EXTREMITY Common Femoral Vein: No evidence of thrombus. Normal compressibility, respiratory phasicity and response to augmentation. Saphenofemoral Junction: No evidence of thrombus. Normal compressibility  and flow on color Doppler imaging. Profunda Femoral Vein: No evidence of thrombus. Normal compressibility and flow on color Doppler imaging. Femoral Vein: No evidence of thrombus. Normal compressibility, respiratory phasicity and response to augmentation. Popliteal Vein: No evidence of thrombus. Normal compressibility, respiratory phasicity and response to augmentation. Calf Veins: Calf veins were not evaluated due to sequential compression devices. Superficial Great Saphenous Vein: No evidence of thrombus. Normal compressibility. Venous Reflux:  None. Other Findings: No evidence of superficial thrombophlebitis or abnormal fluid collection. IMPRESSION: No  visualized deep venous thrombosis in either lower extremity. Calf veins could not be evaluated bilaterally due to sequential compression devices. Electronically Signed   By: Aletta Edouard M.D.   On: 07/10/2021 13:36   DG CHEST PORT 1 VIEW  Result Date: 07/10/2021 CLINICAL DATA:  Sepsis, respiratory distress, pneumonia, leukocytosis EXAM: PORTABLE CHEST 1 VIEW COMPARISON:  Portable exam 0443 hours compared to 07/08/2021 FINDINGS: Enlargement of cardiac silhouette. Mediastinal contours and pulmonary vascularity normal. Increased opacity RIGHT mid lung and LEFT base question pneumonia. No pleural effusion or pneumothorax. Bones demineralized. IMPRESSION: Increased opacity RIGHT perihilar and LEFT base suspicious for pneumonia. Electronically Signed   By: Lavonia Dana M.D.   On: 07/10/2021 08:23   DG Chest Port 1 View  Result Date: 07/08/2021 CLINICAL DATA:  Community-acquired pneumonia.  Dyspnea. EXAM: PORTABLE CHEST 1 VIEW COMPARISON:  06/10/2021 FINDINGS: The cardiac silhouette remains enlarged. Lung volumes remain low. Right perihilar/infrahilar airspace opacities on the prior study have improved with mild residual opacity remaining. Mild left basilar opacity likely reflects atelectasis or scarring. No sizable pleural effusion or pneumothorax is identified. No acute osseous abnormality is seen. IMPRESSION: Decreasing right perihilar/infrahilar opacity suggestive of improving pneumonia. Electronically Signed   By: Logan Bores M.D.   On: 07/08/2021 08:09   DG Chest Port 1 View  Result Date: 06/28/2021 CLINICAL DATA:  Respiratory distress since yesterday EXAM: PORTABLE CHEST 1 VIEW COMPARISON:  Chest radiograph 06/07/2019 FINDINGS: The heart is enlarged, unchanged. The upper mediastinal contours are within normal limits. There is patchy airspace disease in the right hilar/infrahilar region. There is no other focal consolidation. There is no overt pulmonary edema. There is no significant pleural effusion.  There is no pneumothorax. There is no acute osseous abnormality. IMPRESSION: Opacities in the right hilar/infrahilar regions suspicious for pneumonia in the correct clinical setting. Recommend follow-up radiographs in 3-4 weeks to ensure resolution. Electronically Signed   By: Valetta Mole M.D.   On: 07/06/2021 09:44   ECHOCARDIOGRAM COMPLETE  Result Date: 07/08/2021    ECHOCARDIOGRAM REPORT   Patient Name:   Jason Holt. Date of Exam: 07/08/2021 Medical Rec #:  [id number]               Height:       65.0 in Accession #:    [id number]              Weight:       208.8 lb Date of Birth:  05/19/1945               BSA:          2.015 m Patient Age:    76 years                BP:           170/62 mmHg Patient Gender: M                       HR:  93 bpm. Exam Location:  [facility] Procedure: 2D Echo, Cardiac Doppler, Color Doppler and Intracardiac            Opacification Agent Indications:    Elevated Troponin  History:        Patient has prior history of Echocardiogram examinations, most                 recent 02/18/2011. Risk Factors:Hypertension. Sepsis.  Sonographer:    Wenda Low Referring Phys: [id number] [place] [facility]  Sonographer Comments: Patient is morbidly obese. IMPRESSIONS  1. Left ventricular ejection fraction, by estimation, is 60 to 65%. The left ventricle has normal function. The left ventricle has no regional wall motion abnormalities. There is mild concentric left ventricular hypertrophy. Left ventricular diastolic parameters are consistent with Grade II diastolic dysfunction (pseudonormalization). There is the interventricular septum is flattened in diastole ('D' shaped left ventricle), consistent with right ventricular volume overload.  2. Right ventricular systolic function is mildly reduced. The right ventricular size is dilated. There is moderately elevated pulmonary artery systolic pressure. RV mid wall not well visualized.  3. The mitral valve is grossly normal. No  evidence of mitral valve regurgitation. No evidence of mitral stenosis.  4. The aortic valve is tricuspid. Aortic valve regurgitation is not visualized. No aortic stenosis is present. Comparison(s): Unable to see 2012 study. Conclusion(s)/Recommendation(s): RV dysfunction and dilation (unclear if new, no true McConnells sign) in the setting of elevated troponin, PE is in differential. FINDINGS  Left Ventricle: Left ventricular ejection fraction, by estimation, is 60 to 65%. The left ventricle has normal function. The left ventricle has no regional wall motion abnormalities. Definity contrast agent was given IV to delineate the left ventricular  endocardial borders. The left ventricular internal cavity size was normal in size. There is mild concentric left ventricular hypertrophy. The interventricular septum is flattened in diastole ('D' shaped left ventricle), consistent with right ventricular  volume overload. Left ventricular diastolic parameters are consistent with Grade II diastolic dysfunction (pseudonormalization). Right Ventricle: RVOT is dilated. The right ventricular size is moderately enlarged. No increase in right ventricular wall thickness. Right ventricular systolic function is mildly reduced. There is moderately elevated pulmonary artery systolic pressure. The tricuspid regurgitant velocity is 3.11 m/s, and with an assumed right atrial pressure of 15 mmHg, the estimated right ventricular systolic pressure is 73.4 mmHg. Left Atrium: Left atrial size was normal in size. Right Atrium: Right atrial size was normal in size. Pericardium: Trivial pericardial effusion is present. Presence of epicardial fat layer. Mitral Valve: The mitral valve is grossly normal. No evidence of mitral valve regurgitation. No evidence of mitral valve stenosis. MV peak gradient, 6.4 mmHg. The mean mitral valve gradient is 2.0 mmHg. Tricuspid Valve: The tricuspid valve is normal in structure. Tricuspid valve regurgitation is mild.  Aortic Valve: The aortic valve is tricuspid. There is mild aortic valve annular calcification. Aortic valve regurgitation is not visualized. No aortic stenosis is present. Aortic valve mean gradient measures 2.0 mmHg. Aortic valve peak gradient measures 4.5 mmHg. Aortic valve area, by VTI measures 2.65 cm. Pulmonic Valve: The pulmonic valve was normal in structure. Pulmonic valve regurgitation is mild. No evidence of pulmonic stenosis. Aorta: The aortic root and ascending aorta are structurally normal, with no evidence of dilitation. IAS/Shunts: No atrial level shunt detected by color flow Doppler.  LEFT VENTRICLE PLAX 2D LVIDd:         4.00 cm     Diastology LVIDs:  2.50 cm     LV e' medial:    4.24 cm/s LV PW:         1.00 cm     LV E/e' medial:  16.2 LV IVS:        1.30 cm     LV e' lateral:   5.44 cm/s LVOT diam:     2.00 cm     LV E/e' lateral: 12.6 LV SV:         46 LV SV Index:   23 LVOT Area:     3.14 cm  LV Volumes (MOD) LV vol d, MOD A2C: 36.8 ml LV vol d, MOD A4C: 30.7 ml LV vol s, MOD A2C: 16.6 ml LV vol s, MOD A4C: 12.1 ml LV SV MOD A2C:     20.2 ml LV SV MOD A4C:     30.7 ml LV SV MOD BP:      19.3 ml RIGHT VENTRICLE RV Basal diam:  4.50 cm RV Mid diam:    4.00 cm RV S prime:     9.90 cm/s TAPSE (M-mode): 1.3 cm LEFT ATRIUM             Index        RIGHT ATRIUM           Index LA diam:        4.10 cm 2.04 cm/m   RA Area:     19.30 cm LA Vol (A2C):   45.1 ml 22.39 ml/m  RA Volume:   62.50 ml  31.02 ml/m LA Vol (A4C):   47.2 ml 23.43 ml/m LA Biplane Vol: 48.7 ml 24.17 ml/m  AORTIC VALVE                    PULMONIC VALVE AV Area (Vmax):    2.59 cm     PV Vmax:       0.54 m/s AV Area (Vmean):   2.57 cm     PV Peak grad:  1.2 mmHg AV Area (VTI):     2.65 cm AV Vmax:           106.00 cm/s AV Vmean:          59.300 cm/s AV VTI:            0.172 m AV Peak Grad:      4.5 mmHg AV Mean Grad:      2.0 mmHg LVOT Vmax:         87.50 cm/s LVOT Vmean:        48.500 cm/s LVOT VTI:          0.145 m  LVOT/AV VTI ratio: 0.84  AORTA Ao Root diam: 2.80 cm Ao Asc diam:  3.30 cm MITRAL VALVE                TRICUSPID VALVE MV Area (PHT): 6.02 cm     TR Peak grad:   38.7 mmHg MV Area VTI:   2.32 cm     TR Vmax:        311.00 cm/s MV Peak grad:  6.4 mmHg MV Mean grad:  2.0 mmHg     SHUNTS MV Vmax:       1.26 m/s     Systemic VTI:  0.14 m MV Vmean:      63.8 cm/s    Systemic Diam: 2.00 cm MV Decel Time: 126 msec MV E velocity: 68.60 cm/s MV A velocity: 114.00 cm/s MV E/A ratio:  0.60  Rudean Haskell MD Electronically signed by Rudean Haskell MD Signature Date/Time: 07/08/2021/3:49:54 PM    Final     Labs:  CBC: Recent Labs    07/08/21 0631 07/09/21 0356 07/10/21 0405 07/11/21 0343  WBC 18.8* 25.5* 14.5* 15.1*  HGB 11.7* 11.1* 10.8* 10.8*  HCT 36.6* 36.3* 34.8* 36.4*  PLT 182 298 276 256    COAGS: Recent Labs    07/03/2021 0950 07/10/21 1237  INR 1.1 1.1  APTT 32 33    BMP: Recent Labs    07/08/21 0339 07/09/21 0356 07/10/21 0405 07/11/21 0343  NA 143 147* 151* 148*  K 4.0 3.2* 3.7 4.0  CL 114* 115* 117* 113*  CO2 15* 19* 23 20*  GLUCOSE 148* 124* 142* 141*  BUN 39* 42* 37* 34*  CALCIUM 8.3* 8.8* 8.3* 8.0*  CREATININE 1.66* 1.51* 1.33* 1.62*  GFRNONAA 42* 47* 55* 43*    LIVER FUNCTION TESTS: Recent Labs    07/08/21 0339 07/09/21 0356 07/10/21 0405 07/11/21 0343  BILITOT 0.7 0.1* 0.1* 0.4  AST '30 25 21 19  '$ ALT '21 22 23 22  '$ ALKPHOS 104 113 123 130*  PROT 6.8 7.0 6.6 6.8  ALBUMIN 3.1* 3.2* 3.0* 3.0*    TUMOR MARKERS: No results for input(s): AFPTM, CEA, CA199, CHROMGRNA in the last 8760 hours.  Assessment and Plan:  Pt with Bilat PE and Rt heart strain +SOB Does feel better and with less SOB on heparin ----just started this am  Dr Denna Haggard has discussed with pt the procedure of Pulmonary embolus thrombectomy performed in IR.  He has answered all questions to satisfaction. Dr Denna Haggard and Dr Lake Bells have discussed pt status and needs Pt seems to be in  less distress after start of heparin drip We will see pt in am--- determine best course of action at that time Pt is agreeable to proceed with procedure if needed.  Dr Denna Haggard also talking with pts Dtr Launa Flight to be sure all are on same page.  Thank you for this interesting consult.  I greatly enjoyed meeting Laura Radilla. and look forward to participating in their care.  A copy of this report was sent to the requesting provider on this date.  Electronically Signed: Lavonia Drafts, PA-C 07/11/2021, 1:21 PM   I spent a total of 40 Minutes    in face to face in clinical consultation, greater than 50% of which was counseling/coordinating care for PE thrombectomy procedure

## 2021-07-12 DIAGNOSIS — A419 Sepsis, unspecified organism: Secondary | ICD-10-CM | POA: Diagnosis not present

## 2021-07-12 DIAGNOSIS — R6520 Severe sepsis without septic shock: Secondary | ICD-10-CM | POA: Diagnosis not present

## 2021-07-12 LAB — CBC
HCT: 31.4 % — ABNORMAL LOW (ref 39.0–52.0)
HCT: 32 % — ABNORMAL LOW (ref 39.0–52.0)
Hemoglobin: 10 g/dL — ABNORMAL LOW (ref 13.0–17.0)
Hemoglobin: 9.9 g/dL — ABNORMAL LOW (ref 13.0–17.0)
MCH: 27.8 pg (ref 26.0–34.0)
MCH: 27.2 pg (ref 26.0–34.0)
MCHC: 31.8 g/dL (ref 30.0–36.0)
MCHC: 30.9 g/dL (ref 30.0–36.0)
MCV: 87.2 fL (ref 80.0–100.0)
MCV: 87.9 fL (ref 80.0–100.0)
Platelets: 236 10*3/uL (ref 150–400)
Platelets: 241 10*3/uL (ref 150–400)
RBC: 3.6 MIL/uL — ABNORMAL LOW (ref 4.22–5.81)
RBC: 3.64 MIL/uL — ABNORMAL LOW (ref 4.22–5.81)
RDW: 15.3 % (ref 11.5–15.5)
RDW: 15.5 % (ref 11.5–15.5)
WBC: 18.6 10*3/uL — ABNORMAL HIGH (ref 4.0–10.5)
WBC: 13.8 10*3/uL — ABNORMAL HIGH (ref 4.0–10.5)
nRBC: 0.1 % (ref 0.0–0.2)
nRBC: 0.1 % (ref 0.0–0.2)

## 2021-07-12 LAB — CBC WITH DIFFERENTIAL/PLATELET
Abs Immature Granulocytes: 0.29 10*3/uL — ABNORMAL HIGH (ref 0.00–0.07)
Basophils Absolute: 0.1 10*3/uL (ref 0.0–0.1)
Basophils Relative: 1 %
Eosinophils Absolute: 0.6 10*3/uL — ABNORMAL HIGH (ref 0.0–0.5)
Eosinophils Relative: 5 %
HCT: 34.2 % — ABNORMAL LOW (ref 39.0–52.0)
Hemoglobin: 10.9 g/dL — ABNORMAL LOW (ref 13.0–17.0)
Immature Granulocytes: 2 %
Lymphocytes Relative: 10 %
Lymphs Abs: 1.4 10*3/uL (ref 0.7–4.0)
MCH: 27.9 pg (ref 26.0–34.0)
MCHC: 31.9 g/dL (ref 30.0–36.0)
MCV: 87.5 fL (ref 80.0–100.0)
Monocytes Absolute: 1 10*3/uL (ref 0.1–1.0)
Monocytes Relative: 7 %
Neutro Abs: 10.9 10*3/uL — ABNORMAL HIGH (ref 1.7–7.7)
Neutrophils Relative %: 75 %
Platelets: 243 10*3/uL (ref 150–400)
RBC: 3.91 MIL/uL — ABNORMAL LOW (ref 4.22–5.81)
RDW: 15.4 % (ref 11.5–15.5)
WBC: 14.4 10*3/uL — ABNORMAL HIGH (ref 4.0–10.5)
nRBC: 0.1 % (ref 0.0–0.2)

## 2021-07-12 LAB — COMPREHENSIVE METABOLIC PANEL
ALT: 17 U/L (ref 0–44)
AST: 14 U/L — ABNORMAL LOW (ref 15–41)
Albumin: 2.5 g/dL — ABNORMAL LOW (ref 3.5–5.0)
Alkaline Phosphatase: 117 U/L (ref 38–126)
Anion gap: 13 (ref 5–15)
BUN: 33 mg/dL — ABNORMAL HIGH (ref 8–23)
CO2: 20 mmol/L — ABNORMAL LOW (ref 22–32)
Calcium: 8 mg/dL — ABNORMAL LOW (ref 8.9–10.3)
Chloride: 113 mmol/L — ABNORMAL HIGH (ref 98–111)
Creatinine, Ser: 2.07 mg/dL — ABNORMAL HIGH (ref 0.61–1.24)
GFR, Estimated: 32 mL/min — ABNORMAL LOW (ref >60)
Glucose, Bld: 126 mg/dL — ABNORMAL HIGH (ref 70–99)
Potassium: 4.2 mmol/L (ref 3.5–5.1)
Sodium: 146 mmol/L — ABNORMAL HIGH (ref 135–145)
Total Bilirubin: <0.1 mg/dL — ABNORMAL LOW (ref 0.3–1.2)
Total Protein: 5.7 g/dL — ABNORMAL LOW (ref 6.5–8.1)

## 2021-07-12 LAB — HEPARIN LEVEL (UNFRACTIONATED)
Heparin Unfractionated: 0.42 IU/mL (ref 0.30–0.70)
Heparin Unfractionated: 0.19 IU/mL — ABNORMAL LOW (ref 0.30–0.70)

## 2021-07-12 LAB — TYPE AND SCREEN
ABO/RH(D): O POS
Antibody Screen: NEGATIVE

## 2021-07-12 LAB — APTT: aPTT: 47 seconds — ABNORMAL HIGH (ref 24–36)

## 2021-07-12 LAB — PROTIME-INR
INR: 1.5 — ABNORMAL HIGH (ref 0.8–1.2)
Prothrombin Time: 17.7 seconds — ABNORMAL HIGH (ref 11.4–15.2)

## 2021-07-12 MED ORDER — SODIUM CHLORIDE 0.9 % IV SOLN
250.0000 mL | Freq: Once | INTRAVENOUS | Status: AC
  Administered 2021-07-12: 250 mL via INTRAVENOUS

## 2021-07-12 MED ORDER — PANTOPRAZOLE SODIUM 40 MG IV SOLR
40.0000 mg | Freq: Two times a day (BID) | INTRAVENOUS | Status: DC
  Administered 2021-07-12: 40 mg via INTRAVENOUS
  Filled 2021-07-12: qty 10

## 2021-07-12 MED ORDER — HYDRALAZINE HCL 20 MG/ML IJ SOLN: 20.0000 mg | INTRAMUSCULAR | Status: DC | PRN

## 2021-07-12 MED ORDER — ALTEPLASE (PULMONARY EMBOLISM) INFUSION
100.0000 mg | Freq: Once | INTRAVENOUS | Status: AC
  Administered 2021-07-12: 100 mg via INTRAVENOUS
  Filled 2021-07-12: qty 100

## 2021-07-12 NOTE — Progress Notes (Signed)
Pt had large BM with small to moderate amount of bright red blood noted. Dr. Tamala Julian and pharmacist notified. ?

## 2021-07-12 NOTE — Progress Notes (Addendum)
ANTICOAGULATION CONSULT NOTE ? ?Pharmacy Consult for heparin ?Indication: pulmonary embolus ? ?Allergies  ?Allergen Reactions  ? Influenza Virus Vacc Split Pf Hives  ? Dye Fdc Red [Red Dye]   ? ? ?Patient Measurements: ?Height: '5\' 5"'$  (165.1 cm) ?Weight: 97.9 kg (215 lb 13.3 oz) ?IBW/kg (Calculated) : 61.5 ?HEPARIN DW (KG): 82.1  ? ?Labs: ?Recent Labs  ?  07/10/21 ?0405 07/10/21 ?1237 07/10/21 ?2211 07/11/21 ?5621 07/11/21 ?1327 07/12/21 ?0306  ?HGB 10.8*  --   --  10.8*  --  10.9*  ?HCT 34.8*  --   --  36.4*  --  34.2*  ?PLT 276  --   --  256  --  243  ?APTT  --  33  --   --   --   --   ?LABPROT  --  14.4  --   --   --   --   ?INR  --  1.1  --   --   --   --   ?HEPARINUNFRC  --   --    < > 0.73* 0.59 0.42  ?CREATININE 1.33*  --   --  1.62*  --  2.07*  ? < > = values in this interval not displayed.  ? ? ? ?Estimated Creatinine Clearance: 32.2 mL/min (A) (by C-G formula based on SCr of 2.07 mg/dL (H)). ? ? ?Medical History: ?Past Medical History:  ?Diagnosis Date  ? Acid reflux   ? Chronic knee pain   ? Depression   ? Hypertension   ? Ruptured lumbar disc   ? ? ?Medications:  ?Medications Prior to Admission  ?Medication Sig Dispense Refill Last Dose  ? buprenorphine (BUTRANS) 5 MCG/HR PTWK Place 1 patch onto the skin once a week.   07/09/2021  ? gabapentin (NEURONTIN) 300 MG capsule Take 1 capsule (300 mg total) by mouth 3 (three) times daily. 90 capsule 1 06/27/2021  ? lisinopril (PRINIVIL,ZESTRIL) 5 MG tablet Take 5 mg daily by mouth.   07/06/2021  ? pantoprazole (PROTONIX) 40 MG tablet Take 40 mg by mouth daily.   07/06/2021  ? docusate sodium (COLACE) 100 MG capsule Take 1 capsule (100 mg total) by mouth every 12 (twelve) hours. (Patient taking differently: Take 100 mg by mouth 2 (two) times daily as needed for mild constipation or moderate constipation. ) 60 capsule 0   ? ? ?Assessment: ?76 year old male admitted for shortness of breath. CT Positive for acute PE with evidence of right heart strain (RV/LV Ratio = 1.3)  consistent with at least submassive PE. Patient transferred from [facility] to [facility] for possible IR intervention vs. thrombolytic. Decision made per CCM to hold off until 3/5 to reassess patient after at least 24 hours on systemic heparin. ? ?Heparin level remains therapeutic this morning at 0.42. CBC stable. No bleed issues per discussion with RN. ? ?CCM made decision to give systemic TPA this morning with no improvement in patient's oxygenation on therapeutic heparin. BP improved today. Heparin paused prior to starting TPA. ? ? ?Goal of Therapy:  ?Heparin level 0.3-0.5 units/ml for 24hrs after TPA, then 0.3-0.7 units/ml ?Monitor platelets by anticoagulation protocol: Yes ?  ?Plan:  ?Heparin paused prior to starting alteplase '100mg'$  infusion over 2 hrs (started 3/5 at 0850) ?Recheck aPTT, PT/INR, and CBC 30 min after TPA completed - plan to resume heparin infusion at with no bolus when aPTT <80. If aPTT >/=80, recheck q2h until <80 seconds ?F/u 6hr heparin level from resumption ?Monitor daily CBC, s/sx bleeding  closely ? ? ?Arturo Morton, PharmD, BCPS ?Please check AMION for all [place] contact numbers ?Clinical Pharmacist ?07/12/2021 9:23 AM ? ?ADDENDUM - APTT post-TPA resulted at 47. Will resume heparin infusion with no bolus at 12-14 units/kg/hr per protocol. Hg drifting down a bit to 10, plt wnl. INR up to 1.5. Noted SCr trend up to 2.07 today. No bleed issues reported per RN. ? ?Plan: ?No bolus. Resume heparin at 1100 units/hr ?Check 6hr heparin level ?Monitor CBC, s/sx bleeding ? ? ?Arturo Morton, PharmD, BCPS ?Please check AMION for all [place] contact numbers ?Clinical Pharmacist ?07/12/2021 12:39 PM ? ?

## 2021-07-12 NOTE — Progress Notes (Signed)
ANTICOAGULATION CONSULT NOTE ? ?Pharmacy Consult for heparin ?Indication: pulmonary embolus ? ?Allergies  ?Allergen Reactions  ? Influenza Virus Vacc Split Pf Hives  ? Dye Fdc Red [Red Dye]   ? ? ?Patient Measurements: ?Height: '5\' 5"'$  (165.1 cm) ?Weight: 97.9 kg (215 lb 13.3 oz) ?IBW/kg (Calculated) : 61.5 ?HEPARIN DW (KG): 82.1  ? ?Labs: ?Recent Labs  ?  07/10/21 ?0405 07/10/21 ?1237 07/10/21 ?2211 07/11/21 ?2542 07/11/21 ?1327 07/12/21 ?0306 07/12/21 ?1143 07/12/21 ?2023  ?HGB 10.8*  --   --  10.8*  --  10.9* 10.0* 9.9*  ?HCT 34.8*  --   --  36.4*  --  34.2* 31.4* 32.0*  ?PLT 276  --   --  256  --  243 236 241  ?APTT  --  33  --   --   --   --  47*  --   ?LABPROT  --  14.4  --   --   --   --  17.7*  --   ?INR  --  1.1  --   --   --   --  1.5*  --   ?HEPARINUNFRC  --   --    < > 0.73* 0.59 0.42  --  0.19*  ?CREATININE 1.33*  --   --  1.62*  --  2.07*  --   --   ? < > = values in this interval not displayed.  ? ? ? ?Estimated Creatinine Clearance: 32.2 mL/min (A) (by C-G formula based on SCr of 2.07 mg/dL (H)). ? ? ?Medical History: ?Past Medical History:  ?Diagnosis Date  ? Acid reflux   ? Chronic knee pain   ? Depression   ? Hypertension   ? Ruptured lumbar disc   ? ? ?Medications:  ?Medications Prior to Admission  ?Medication Sig Dispense Refill Last Dose  ? buprenorphine (BUTRANS) 5 MCG/HR PTWK Place 1 patch onto the skin once a week.   07/09/2021  ? gabapentin (NEURONTIN) 300 MG capsule Take 1 capsule (300 mg total) by mouth 3 (three) times daily. 90 capsule 1 06/14/2021  ? lisinopril (PRINIVIL,ZESTRIL) 5 MG tablet Take 5 mg daily by mouth.   07/06/2021  ? pantoprazole (PROTONIX) 40 MG tablet Take 40 mg by mouth daily.   07/06/2021  ? docusate sodium (COLACE) 100 MG capsule Take 1 capsule (100 mg total) by mouth every 12 (twelve) hours. (Patient taking differently: Take 100 mg by mouth 2 (two) times daily as needed for mild constipation or moderate constipation. ) 60 capsule 0   ? ? ?Assessment: ?76 year old male  admitted for shortness of breath. CT Positive for acute PE with evidence of right heart strain (RV/LV Ratio = 1.3) consistent with at least submassive PE. Patient transferred from [facility] to [facility] for possible IR intervention vs. thrombolytic. Decision made per CCM to hold off until 3/5 to reassess patient after at least 24 hours on systemic heparin. ? ?Heparin level remains therapeutic this morning at 0.42. CBC stable. No bleed issues per discussion with RN. ? ?CCM made decision to give systemic TPA this morning with no improvement in patient's oxygenation on therapeutic heparin. BP improved today. Heparin paused prior to starting TPA. ? ? ?Goal of Therapy:  ?Heparin level 0.3-0.5 units/ml for 24hrs after TPA, then 0.3-0.7 units/ml ?Monitor platelets by anticoagulation protocol: Yes ?  ?Plan:  ?Heparin paused prior to starting alteplase '100mg'$  infusion over 2 hrs (started 3/5 at 0850) ?Recheck aPTT, PT/INR, and CBC 30 min after TPA completed -  plan to resume heparin infusion at with no bolus when aPTT <80. If aPTT >/=80, recheck q2h until <80 seconds ?F/u 6hr heparin level from resumption ?Monitor daily CBC, s/sx bleeding closely ? ? ?ADDENDUM - APTT post-TPA resulted at 47. Will resume heparin infusion with no bolus at 12-14 units/kg/hr per protocol. Hg drifting down a bit to 10, plt wnl. INR up to 1.5. Noted SCr trend up to 2.07 today. No bleed issues reported per RN. ? ?Heparin level came back at 0.19 this PM. First shift Rn reported some bright blood in BM. Dr. Tamala Julian was made aware at the time. No further bleeding issue per night shift Rn. Will target for low goal. Pt may get repeat cath-directed TPA.  ? ?Plan: ?Increase heparin to 1200 units/hr ?Check AM heparin level ?Monitor CBC, s/sx bleeding ? ? ?Onnie Boer, PharmD, BCIDP, AAHIVP, CPP ?Infectious Disease Pharmacist ?07/12/2021 9:42 PM ? ? ? ?

## 2021-07-12 NOTE — Progress Notes (Signed)
? ?  IR aware of this pt ?Planned for possible PE thrombectomy in IR ? ?Discussion per Dr Lake Bells and Dr Denna Haggard -- ?Pt stable and with no increase in oxygen needs ?Decision made to hold off yet another day ? ?IR will check in with PCCM in am ?

## 2021-07-12 NOTE — Progress Notes (Signed)
? ?NAME:  Jason Faniel., MRN:  [id number], DOB:  28-Aug-1945, LOS: 5 ?ADMISSION DATE:  07/03/2021, CONSULTATION DATE:  2/28 ?REFERRING MD:  Wynetta Emery, CHIEF COMPLAINT:  Dyspnea  ? ?History of Present Illness:  ?76 y/o male presented to [facility] on 2/28 after his family noted that his oxygenation was lower than normal at home.  He says that he didn't have any dyspnea or chest pain at the time.  He was admitted to [facility], diagnosed with pneumonia and sepsis.  He required BIPAP for several days, then has transitioned to high flow nasal cannula.  He moved to [facility] on 3/4 because his oxygenation wasn't improving and a CT angiogram was performed that showed a saddle PE.   ?He is complaining of abdominal, back and leg pain today but denies dyspnea.  He has not had a cough or mucus production.  He denies chest pain.   ? 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 ?Significant Hospital Events: ?Including procedures, antibiotic start and stop dates in addition to other pertinent events   ?2/28 admission, treatment for pneumonia with ceftriaxone/azithro, elevated troponin, treated with BIPAP ?3/1 TTE> LVEF 55-60%, mild LVH, D shaped septum, evidence of RV overload ?3/3 found to have saddle PE on CT angio, patchy ggo bilaterally; negative lower ext doppler, started on heparin ?3/4 moved to [facility] for persistent hypoxemia, remains on HHF ?3/5 no change in oxygen needs ? ?Interim History / Subjective:  ? ?Feels about the same as yesterday  ?Still complains of no dyspnea ? ?Objective   ?Blood pressure (!) 164/130, pulse 93, temperature 98.5 ?F (36.9 ?C), temperature source Axillary, resp. rate 17, height '5\' 5"'$  (1.651 m), weight 97.9 kg, SpO2 94 %. ?   ?FiO2 (%):  [40 %-50 %] 50 %  ? ?Intake/Output Summary (Last 24 hours) at 07/12/2021 0756 ?Last data filed at 07/12/2021 0600 ?Gross per 24 hour  ?Intake 281.51 ml  ?Output 750 ml  ?Net  -468.49 ml  ? ?Filed Weights  ? 07/10/21 0500 07/11/21 0500 07/12/21 0500  ?Weight: 95.1 kg 97.1 kg 97.9 kg  ? ? ?Examination: ? ?General:  Chronically ill appearing, resting comfortably in bed ?HENT: NCAT OP clear ?PULM: CTA B, normal effort ?CV: Tachy, regular, no mgr ?GI: BS+, soft, nontender ?MSK: normal bulk and tone ?Neuro: awake, alert, no distress, MAEW ? ? ?Resolved Hospital Problem list   ?Pneumonia ? ?Assessment & Plan:  ?Critically ill due to Acute respiratory failure with hypoxemia due to saddle PE, submassive with RV strain > no improvement in oxygenation after 36 hours on heparin ?Elevated troponin due to RV strain ?After discussing risks and benefits of TPA with the patient will administer systemic TPA this morning; his BP has improved, he has no absolute contraindications ?If no improvement in oxygenation then we can consider thrombectomy later today or tomorrow ?Heparin per pharmacy protocol ?Continue metoprolol ? ?Chronic pain from prior trauma, ruptured disc ?Restart Butrans patch  ?Prn hydrocodone ?gabapentin ? ?Best Practice (right click and "Reselect all SmartList Selections" daily)  ? ?Diet/type: Regular consistency (see orders) ?DVT prophylaxis: systemic heparin ?GI prophylaxis: N/A ?Lines: N/A ?Foley:  N/A ?Code Status:  full code ?Last date of multidisciplinary goals of care discussion [several were held on 3/4.  See documentation] ? ?Labs   ?CBC: ?Recent Labs  ?Lab 06/12/2021 ?0950 07/08/21 ?0631 07/09/21 ?0356 07/10/21 ?0405 07/11/21 ?7517 07/12/21 ?0306  ?WBC 14.1* 18.8*  25.5* 14.5* 15.1* 14.4*  ?NEUTROABS 11.2*  --  21.1* 11.7* 11.7* 10.9*  ?HGB 11.9* 11.7* 11.1* 10.8* 10.8* 10.9*  ?HCT 37.4* 36.6* 36.3* 34.8* 36.4* 34.2*  ?MCV 87.4 86.1 86.6 88.3 90.3 87.5  ?PLT 234 182 298 276 256 243  ? ? ?Basic Metabolic Panel: ?Recent Labs  ?Lab 07/08/21 ?4174 07/09/21 ?0356 07/10/21 ?0405 07/11/21 ?0814 07/12/21 ?0306  ?NA 143 147* 151* 148* 146*  ?K 4.0 3.2* 3.7 4.0 4.2  ?CL 114* 115* 117* 113*  113*  ?CO2 15* 19* 23 20* 20*  ?GLUCOSE 148* 124* 142* 141* 126*  ?BUN 39* 42* 37* 34* 33*  ?CREATININE 1.66* 1.51* 1.33* 1.62* 2.07*  ?CALCIUM 8.3* 8.8* 8.3* 8.0* 8.0*  ?MG 2.2 1.9 1.9  --   --   ? ?GFR: ?Estimated Creatinine Clearance: 32.2 mL/min (A) (by C-G formula based on SCr of 2.07 mg/dL (H)). ?Recent Labs  ?Lab 06/27/2021 ?0950 06/15/2021 ?1100 07/08/21 ?4818 07/08/21 ?0631 07/08/21 ?1116 07/09/21 ?0356 07/10/21 ?0405 07/11/21 ?5631 07/12/21 ?0306  ?PROCALCITON <0.10  --  0.30  --   --  0.23 0.18 0.21  --   ?WBC 14.1*  --   --    < >  --  25.5* 14.5* 15.1* 14.4*  ?LATICACIDVEN 3.2* 3.6*  --   --  1.7  --   --   --   --   ? < > = values in this interval not displayed.  ? ? ?Liver Function Tests: ?Recent Labs  ?Lab 07/08/21 ?4970 07/09/21 ?0356 07/10/21 ?0405 07/11/21 ?2637 07/12/21 ?0306  ?AST '30 25 21 19 '$ 14*  ?ALT '21 22 23 22 17  '$ ?ALKPHOS 104 113 123 130* 117  ?BILITOT 0.7 0.1* 0.1* 0.4 <0.1*  ?PROT 6.8 7.0 6.6 6.8 5.7*  ?ALBUMIN 3.1* 3.2* 3.0* 3.0* 2.5*  ? ?No results for input(s): LIPASE, AMYLASE in the last 168 hours. ?No results for input(s): AMMONIA in the last 168 hours. ? ?ABG ?   ?Component Value Date/Time  ? PHART 7.27 (L) 06/30/2021 0947  ? PCO2ART 31 (L) 06/15/2021 0947  ? PO2ART 59 (L) 06/20/2021 0947  ? HCO3 14.4 (L) 06/24/2021 0947  ? TCO2 25.9 03/01/2011 0429  ? ACIDBASEDEF 11.5 (H) 06/12/2021 0947  ? O2SAT 88.7 06/15/2021 0947  ?  ? ?Coagulation Profile: ?Recent Labs  ?Lab 07/05/2021 ?0950 07/10/21 ?1237  ?INR 1.1 1.1  ? ? ?Cardiac Enzymes: ?Recent Labs  ?Lab 07/05/2021 ?1104  ?CKTOTAL 62  ? ? ?HbA1C: ?Hgb A1c MFr Bld  ?Date/Time Value Ref Range Status  ?03/04/2011 02:00 PM 5.5 <5.7 % Final  ?  Comment:  ?  (NOTE) ?                                                                      ?According to the ADA Clinical Practice Recommendations for 2011, when ?HbA1c is used as a screening test: ? >=6.5%   Diagnostic of Diabetes Mellitus ?          (if abnormal result is confirmed) ?5.7-6.4%   Increased  risk of developing Diabetes Mellitus ?References:Diagnosis and Classification of Diabetes Mellitus,Diabetes ?CHYI,5027,74(JOINO 1):S62-S69 and Standards of Medical Care in         ?Diabetes - 2011,Diabetes Care,2011,34 (Suppl 1):S11-S61.  ? ? ?  CBG: ?Recent Labs  ?Lab 06/23/2021 ?2304 07/08/21 ?0345 07/08/21 ?0720 07/08/21 ?1144 07/08/21 ?1550  ?GLUCAP 169* 143* 118* 152* 121*  ? ? ?Critical care time: 32 minutes ?  ? ? ?Roselie Awkward, MD ?[place] [facility] ?Pager: [phone] ?Cell: [phone] ?After 7:00 pm call Elink  [phone] ? ?

## 2021-07-13 DIAGNOSIS — R6520 Severe sepsis without septic shock: Secondary | ICD-10-CM | POA: Diagnosis not present

## 2021-07-13 DIAGNOSIS — A419 Sepsis, unspecified organism: Secondary | ICD-10-CM | POA: Diagnosis not present

## 2021-07-13 LAB — CBC WITH DIFFERENTIAL/PLATELET
Abs Immature Granulocytes: 0.32 10*3/uL — ABNORMAL HIGH (ref 0.00–0.07)
Basophils Absolute: 0.1 10*3/uL (ref 0.0–0.1)
Basophils Relative: 1 %
Eosinophils Absolute: 0.8 10*3/uL — ABNORMAL HIGH (ref 0.0–0.5)
Eosinophils Relative: 6 %
HCT: 31.7 % — ABNORMAL LOW (ref 39.0–52.0)
Hemoglobin: 9.5 g/dL — ABNORMAL LOW (ref 13.0–17.0)
Immature Granulocytes: 2 %
Lymphocytes Relative: 9 %
Lymphs Abs: 1.2 10*3/uL (ref 0.7–4.0)
MCH: 26.5 pg (ref 26.0–34.0)
MCHC: 30 g/dL (ref 30.0–36.0)
MCV: 88.5 fL (ref 80.0–100.0)
Monocytes Absolute: 1 10*3/uL (ref 0.1–1.0)
Monocytes Relative: 7 %
Neutro Abs: 10.1 10*3/uL — ABNORMAL HIGH (ref 1.7–7.7)
Neutrophils Relative %: 75 %
Platelets: 236 10*3/uL (ref 150–400)
RBC: 3.58 MIL/uL — ABNORMAL LOW (ref 4.22–5.81)
RDW: 15.5 % (ref 11.5–15.5)
WBC: 13.4 10*3/uL — ABNORMAL HIGH (ref 4.0–10.5)
nRBC: 0 % (ref 0.0–0.2)

## 2021-07-13 LAB — CULTURE, BLOOD (ROUTINE X 2)
Culture: NO GROWTH
Culture: NO GROWTH
Special Requests: ADEQUATE
Special Requests: ADEQUATE

## 2021-07-13 LAB — COMPREHENSIVE METABOLIC PANEL
ALT: 15 U/L (ref 0–44)
AST: 14 U/L — ABNORMAL LOW (ref 15–41)
Albumin: 2.3 g/dL — ABNORMAL LOW (ref 3.5–5.0)
Alkaline Phosphatase: 112 U/L (ref 38–126)
Anion gap: 13 (ref 5–15)
BUN: 38 mg/dL — ABNORMAL HIGH (ref 8–23)
CO2: 21 mmol/L — ABNORMAL LOW (ref 22–32)
Calcium: 7.5 mg/dL — ABNORMAL LOW (ref 8.9–10.3)
Chloride: 112 mmol/L — ABNORMAL HIGH (ref 98–111)
Creatinine, Ser: 2.34 mg/dL — ABNORMAL HIGH (ref 0.61–1.24)
GFR, Estimated: 28 mL/min — ABNORMAL LOW (ref >60)
Glucose, Bld: 121 mg/dL — ABNORMAL HIGH (ref 70–99)
Potassium: 4.1 mmol/L (ref 3.5–5.1)
Sodium: 146 mmol/L — ABNORMAL HIGH (ref 135–145)
Total Bilirubin: 0.4 mg/dL (ref 0.3–1.2)
Total Protein: 5.4 g/dL — ABNORMAL LOW (ref 6.5–8.1)

## 2021-07-13 LAB — HEPARIN LEVEL (UNFRACTIONATED)
Heparin Unfractionated: 0.17 IU/mL — ABNORMAL LOW (ref 0.30–0.70)
Heparin Unfractionated: 0.37 IU/mL (ref 0.30–0.70)

## 2021-07-13 LAB — SODIUM, URINE, RANDOM: Sodium, Ur: 25 mmol/L

## 2021-07-13 LAB — CREATININE, URINE, RANDOM: Creatinine, Urine: 71.46 mg/dL

## 2021-07-13 MED ORDER — HYDRALAZINE HCL 20 MG/ML IJ SOLN
20.0000 mg | INTRAMUSCULAR | Status: DC | PRN
  Administered 2021-07-13: 20 mg via INTRAVENOUS
  Filled 2021-07-13: qty 1

## 2021-07-13 MED ORDER — GABAPENTIN 300 MG PO CAPS
300.0000 mg | ORAL_CAPSULE | Freq: Three times a day (TID) | ORAL | Status: DC
Start: 2021-07-13 — End: 2021-07-14
  Administered 2021-07-13 – 2021-07-14 (×4): 300 mg via ORAL
  Filled 2021-07-13 (×4): qty 1

## 2021-07-13 MED ORDER — HYDROXYZINE HCL 25 MG PO TABS
25.0000 mg | ORAL_TABLET | Freq: Three times a day (TID) | ORAL | Status: DC | PRN
  Administered 2021-07-13 – 2021-07-16 (×10): 25 mg via ORAL
  Filled 2021-07-13 (×10): qty 1

## 2021-07-13 MED ORDER — PANTOPRAZOLE SODIUM 40 MG PO TBEC
40.0000 mg | DELAYED_RELEASE_TABLET | Freq: Every day | ORAL | Status: DC
  Administered 2021-07-13 – 2021-07-16 (×4): 40 mg via ORAL
  Filled 2021-07-13 (×4): qty 1

## 2021-07-13 NOTE — Progress Notes (Signed)
ANTICOAGULATION CONSULT NOTE ? ?Pharmacy Consult for heparin ?Indication: pulmonary embolus ? ?Allergies  ?Allergen Reactions  ? Influenza Virus Vacc Split Pf Hives  ? Dye Fdc Red [Red Dye]   ? ? ?Patient Measurements: ?Height: '5\' 5"'$  (165.1 cm) ?Weight: 98 kg (216 lb 0.8 oz) ?IBW/kg (Calculated) : 61.5 ?HEPARIN DW (KG): 82.1  ? ?Labs: ?Recent Labs  ?  07/10/21 ?1237 07/10/21 ?2211 07/11/21 ?3888 07/11/21 ?1327 07/12/21 ?0306 07/12/21 ?1143 07/12/21 ?2023 07/13/21 ?0350  ?HGB  --    < > 10.8*  --  10.9* 10.0* 9.9* 9.5*  ?HCT  --    < > 36.4*  --  34.2* 31.4* 32.0* 31.7*  ?PLT  --    < > 256  --  243 236 241 236  ?APTT 33  --   --   --   --  47*  --   --   ?LABPROT 14.4  --   --   --   --  17.7*  --   --   ?INR 1.1  --   --   --   --  1.5*  --   --   ?HEPARINUNFRC  --    < > 0.73*   < > 0.42  --  0.19* 0.17*  ?CREATININE  --   --  1.62*  --  2.07*  --   --  2.34*  ? < > = values in this interval not displayed.  ? ? ? ?Estimated Creatinine Clearance: 28.5 mL/min (A) (by C-G formula based on SCr of 2.34 mg/dL (H)). ? ? ?Medical History: ?Past Medical History:  ?Diagnosis Date  ? Acid reflux   ? Chronic knee pain   ? Depression   ? Hypertension   ? Ruptured lumbar disc   ? ? ?Medications:  ?Medications Prior to Admission  ?Medication Sig Dispense Refill Last Dose  ? buprenorphine (BUTRANS) 5 MCG/HR PTWK Place 1 patch onto the skin once a week.   07/09/2021  ? gabapentin (NEURONTIN) 300 MG capsule Take 1 capsule (300 mg total) by mouth 3 (three) times daily. 90 capsule 1 06/23/2021  ? lisinopril (PRINIVIL,ZESTRIL) 5 MG tablet Take 5 mg daily by mouth.   07/06/2021  ? pantoprazole (PROTONIX) 40 MG tablet Take 40 mg by mouth daily.   07/06/2021  ? docusate sodium (COLACE) 100 MG capsule Take 1 capsule (100 mg total) by mouth every 12 (twelve) hours. (Patient taking differently: Take 100 mg by mouth 2 (two) times daily as needed for mild constipation or moderate constipation. ) 60 capsule 0   ? ? ?Assessment: ?76 year old male  admitted for shortness of breath. CT Positive for acute PE with evidence of right heart strain (RV/LV Ratio = 1.3) consistent with at least submassive PE. Patient transferred from [facility] to [facility] for possible IR intervention vs. thrombolytic. Decision made per CCM to hold off until 3/5 to reassess patient after at least 24 hours on systemic heparin. ? ?Heparin level remains therapeutic this morning at 0.42. CBC stable. No bleed issues per discussion with RN. ? ?CCM made decision to give systemic TPA this morning with no improvement in patient's oxygenation on therapeutic heparin. BP improved today. Heparin paused prior to starting TPA. ? ?Follow up heparin level still low at 0.17 on 1200 units/hr. Blood noted in stool during the day yesterday but no further bleeding noted. CBC stable.  ? ? ?Goal of Therapy:  ?Heparin level 0.3-0.5 units/ml for 24hrs after TPA, then 0.3-0.7 units/ml ?Monitor platelets by  anticoagulation protocol: Yes ?  ?Plan:  ?Increase heparin to 1400 units/hr ?Recheck heparin level in 8 hours ? ?Erin Hearing PharmD., BCPS ?Clinical Pharmacist ?07/13/2021 6:41 AM ? ?

## 2021-07-13 NOTE — Progress Notes (Signed)
ANTICOAGULATION CONSULT NOTE ? ?Pharmacy Consult for heparin ?Indication: pulmonary embolus ? ?Allergies  ?Allergen Reactions  ? Influenza Virus Vacc Split Pf Hives  ? Dye Fdc Red [Red Dye]   ? ? ?Patient Measurements: ?Height: '5\' 5"'$  (165.1 cm) ?Weight: 98 kg (216 lb 0.8 oz) ?IBW/kg (Calculated) : 61.5 ?HEPARIN DW (KG): 82.1  ? ?Labs: ?Recent Labs  ?  07/11/21 ?2025 07/11/21 ?1327 07/12/21 ?0306 07/12/21 ?1143 07/12/21 ?2023 07/13/21 ?0350 07/13/21 ?1635  ?HGB 10.8*  --  10.9* 10.0* 9.9* 9.5*  --   ?HCT 36.4*  --  34.2* 31.4* 32.0* 31.7*  --   ?PLT 256  --  243 236 241 236  --   ?APTT  --   --   --  47*  --   --   --   ?LABPROT  --   --   --  17.7*  --   --   --   ?INR  --   --   --  1.5*  --   --   --   ?HEPARINUNFRC 0.73*   < > 0.42  --  0.19* 0.17* 0.37  ?CREATININE 1.62*  --  2.07*  --   --  2.34*  --   ? < > = values in this interval not displayed.  ? ? ? ?Estimated Creatinine Clearance: 28.5 mL/min (A) (by C-G formula based on SCr of 2.34 mg/dL (H)). ? ?Assessment: ?76 year old male admitted for shortness of breath. CT Positive for acute PE with evidence of right heart strain (RV/LV Ratio = 1.3) consistent with at least submassive PE. Patient transferred from [facility] to [facility] for possible IR intervention vs. thrombolytic. Decision made per CCM to hold off until 3/5 to reassess patient after at least 24 hours on systemic heparin. ? ?Patient is s/p systemic tPA and heparin was resumed.  Heparin level now therapeutic at 0.37 units/mL.  No issue with infusion nor bleeding per discussion with RN. ? ?Goal of Therapy:  ?Heparin level 0.3-0.7 units/mL ?Monitor platelets by anticoagulation protocol: Yes ?  ?Plan:  ?Continue heparin gtt at 1400 units/hr ?F/U AM labs ? ?Rise Traeger D. Mina Marble, PharmD, BCPS, BCCCP ?07/13/2021, 5:51 PM ? ?

## 2021-07-13 NOTE — Progress Notes (Signed)
Patient's grandson came by to check on patient and let RN know that he is concerned that the patient may be withdrawing from gabapentin and possibly opioids. 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 I asked if he was prescribed any opioids and he said he doesn't know for sure. Yolanda Bonine says patient has history of taking meds that were not prescribed. He also told me that the patient does not have tremors at baseline. I let Dr. Erin Fulling, CCM know.  ? ?Jason Hageman RN ?

## 2021-07-13 NOTE — Progress Notes (Signed)
? ?NAME:  Jason Casique., MRN:  [id number], DOB:  July 26, 1945, LOS: 6 ?ADMISSION DATE:  06/18/2021, CONSULTATION DATE:  2/28 ?REFERRING MD:  Wynetta Emery, CHIEF COMPLAINT:  Dyspnea  ? ?History of Present Illness:  ?76 y/o male presented to [place] on 2/28 after his family noted that his oxygenation was lower than normal at home.  He says that he didn't have any dyspnea or chest pain at the time.  He was admitted to [facility], diagnosed with pneumonia and sepsis.  He required BIPAP for several days, then has transitioned to high flow nasal cannula.  He moved to [facility] on 3/4 because his oxygenation wasn't improving and a CT angiogram was performed that showed a saddle PE.   ?He is complaining of abdominal, back and leg pain today but denies dyspnea.  He has not had a cough or mucus production.  He denies chest pain.   ? 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 ?Significant Hospital Events: ?Including procedures, antibiotic start and stop dates in addition to other pertinent events   ?2/28 admission, treatment for pneumonia with ceftriaxone/azithro, elevated troponin, treated with BIPAP ?3/1 TTE> LVEF 55-60%, mild LVH, D shaped septum, evidence of RV overload ?3/3 found to have saddle PE on CT angio, patchy ggo bilaterally; negative lower ext doppler, started on heparin ?3/4 moved to [facility] for persistent hypoxemia, remains on HHF ?3/5 no change in oxygen needs, given full dose TPA ? ?Interim History / Subjective:  ? ?No acute events overnight. ?Blood pressure is elevated this morning. ?Received TPA yesterday and is feeling much better today. Breathing is easier. ? ?Objective   ?Blood pressure (!) 157/58, pulse 87, temperature 98.2 ?F (36.8 ?C), temperature source Oral, resp. rate 17, height '5\' 5"'$  (1.651 m), weight 98 kg, SpO2 97 %. ?   ?FiO2 (%):  [30 %-50 %] 50 %  ? ?Intake/Output Summary (Last 24 hours) at 07/13/2021  0742 ?Last data filed at 07/13/2021 2633 ?Gross per 24 hour  ?Intake 707.68 ml  ?Output 850 ml  ?Net -142.32 ml  ? ?Filed Weights  ? 07/11/21 0500 07/12/21 0500 07/13/21 0500  ?Weight: 97.1 kg 97.9 kg 98 kg  ? ? ?Examination: ?General:  Chronically ill appearing, obese, resting comfortably in bed ?HENT: /AT, moist mucous membranes, sclera anicteric ?PULM: clear to auscultation bilaterally, normal effort ?CV: rrr, no mgr ?GI: BS+, soft, nontender ?MSK: normal bulk and tone ?Neuro: awake, alert, moving all extremities ? ? ?Resolved Hospital Problem list   ?Pneumonia ?Lactic acidosis ? ?Assessment & Plan:  ?Acute respiratory failure with hypoxemia  ?Saddle PE, submassive with RV strain  ?Elevated troponin due to RV strain ?- s/p full dose TPA 3/5 due to lack of improvement in oxygenation ?- Heparin per pharmacy protocol ?- Wean HFNC to maintain O2 sat 92-95% ?- Continue metoprolol ? ?Hypertension ?- PRN hydralazine IV for sBP goal less than 195mHg ?- monitor closely since he received TPA yesterday ?- avoid calcium channel blockers given acute right heart strain ? ?Acute Kidney Injury ?In setting of hypoxemia and right heart strain ?- Avoid nephrotoxic medications ?- Monitor renal function ?- check renal lytes ? ?Chronic pain from prior trauma, ruptured disc ?Restart Butrans patch  ?Prn hydrocodone ?gabapentin ? ?Best Practice (right click and "Reselect all SmartList Selections" daily)  ? ?Diet/type: Regular consistency (see orders) ?DVT prophylaxis: systemic heparin ?GI prophylaxis: N/A ?Lines: N/A ?Foley:  N/A ?Code Status:  full code ?Last date of multidisciplinary goals of care discussion [several were held on 3/4.  See documentation] ? ?Labs   ?CBC: ?Recent Labs  ?Lab 07/09/21 ?0356 07/10/21 ?0405 07/11/21 ?1660 07/12/21 ?0306 07/12/21 ?1143 07/12/21 ?2023 07/13/21 ?0350  ?WBC 25.5* 14.5* 15.1* 14.4* 18.6* 13.8* 13.4*  ?NEUTROABS 21.1* 11.7* 11.7* 10.9*  --   --  10.1*  ?HGB 11.1* 10.8* 10.8* 10.9* 10.0* 9.9* 9.5*   ?HCT 36.3* 34.8* 36.4* 34.2* 31.4* 32.0* 31.7*  ?MCV 86.6 88.3 90.3 87.5 87.2 87.9 88.5  ?PLT 298 276 256 243 236 241 236  ? ? ?Basic Metabolic Panel: ?Recent Labs  ?Lab 07/08/21 ?6301 07/09/21 ?0356 07/10/21 ?0405 07/11/21 ?6010 07/12/21 ?9323 07/13/21 ?0350  ?NA 143 147* 151* 148* 146* 146*  ?K 4.0 3.2* 3.7 4.0 4.2 4.1  ?CL 114* 115* 117* 113* 113* 112*  ?CO2 15* 19* 23 20* 20* 21*  ?GLUCOSE 148* 124* 142* 141* 126* 121*  ?BUN 39* 42* 37* 34* 33* 38*  ?CREATININE 1.66* 1.51* 1.33* 1.62* 2.07* 2.34*  ?CALCIUM 8.3* 8.8* 8.3* 8.0* 8.0* 7.5*  ?MG 2.2 1.9 1.9  --   --   --   ? ?GFR: ?Estimated Creatinine Clearance: 28.5 mL/min (A) (by C-G formula based on SCr of 2.34 mg/dL (H)). ?Recent Labs  ?Lab 07/01/2021 ?0950 06/27/2021 ?1100 07/08/21 ?5573 07/08/21 ?0631 07/08/21 ?1116 07/09/21 ?0356 07/10/21 ?0405 07/11/21 ?2202 07/12/21 ?0306 07/12/21 ?1143 07/12/21 ?2023 07/13/21 ?0350  ?PROCALCITON <0.10  --  0.30  --   --  0.23 0.18 0.21  --   --   --   --   ?WBC 14.1*  --   --    < >  --  25.5* 14.5* 15.1* 14.4* 18.6* 13.8* 13.4*  ?LATICACIDVEN 3.2* 3.6*  --   --  1.7  --   --   --   --   --   --   --   ? < > = values in this interval not displayed.  ? ? ?Liver Function Tests: ?Recent Labs  ?Lab 07/09/21 ?0356 07/10/21 ?0405 07/11/21 ?5427 07/12/21 ?0623 07/13/21 ?0350  ?AST '25 21 19 '$ 14* 14*  ?ALT '22 23 22 17 15  '$ ?ALKPHOS 113 123 130* 117 112  ?BILITOT 0.1* 0.1* 0.4 <0.1* 0.4  ?PROT 7.0 6.6 6.8 5.7* 5.4*  ?ALBUMIN 3.2* 3.0* 3.0* 2.5* 2.3*  ? ?No results for input(s): LIPASE, AMYLASE in the last 168 hours. ?No results for input(s): AMMONIA in the last 168 hours. ? ?ABG ?   ?Component Value Date/Time  ? PHART 7.27 (L) 07/04/2021 0947  ? PCO2ART 31 (L) 06/26/2021 0947  ? PO2ART 59 (L) 06/25/2021 0947  ? HCO3 14.4 (L) 06/11/2021 0947  ? TCO2 25.9 03/01/2011 0429  ? ACIDBASEDEF 11.5 (H) 06/19/2021 0947  ? O2SAT 88.7 06/12/2021 0947  ?  ? ?Coagulation Profile: ?Recent Labs  ?Lab 06/28/2021 ?0950 07/10/21 ?1237 07/12/21 ?1143  ?INR 1.1  1.1 1.5*  ? ? ?Cardiac Enzymes: ?Recent Labs  ?Lab 07/06/2021 ?1104  ?CKTOTAL 62  ? ? ?HbA1C: ?Hgb A1c MFr Bld  ?Date/Time Value Ref Range Status  ?03/04/2011 02:00 PM 5.5 <5.7 % Final  ?  Comment:  ?  (NOTE) ?                                                                      ?  According to the ADA Clinical Practice Recommendations for 2011, when ?HbA1c is used as a screening test: ? >=6.5%   Diagnostic of Diabetes Mellitus ?          (if abnormal result is confirmed) ?5.7-6.4%   Increased risk of developing Diabetes Mellitus ?References:Diagnosis and Classification of Diabetes Mellitus,Diabetes ?EKBT,2481,85(TMBPJ 1):S62-S69 and Standards of Medical Care in         ?Diabetes - 2011,Diabetes Care,2011,34 (Suppl 1):S11-S61.  ? ? ?CBG: ?Recent Labs  ?Lab 06/29/2021 ?2304 07/08/21 ?0345 07/08/21 ?0720 07/08/21 ?1144 07/08/21 ?1550  ?GLUCAP 169* 143* 118* 152* 121*  ? ? ?Critical care time: 40 minutes ?  ? ?Freda Jackson, MD ?[facility] ?Office: [phone] ? ? ?See Amion for personal pager ?PCCM on call pager [phone] until 7pm. ?Please call Elink 7p-7a. [phone] ? ? ? ?

## 2021-07-14 ENCOUNTER — Telehealth: Payer: Self-pay

## 2021-07-14 ENCOUNTER — Other Ambulatory Visit (HOSPITAL_COMMUNITY): Payer: Self-pay

## 2021-07-14 DIAGNOSIS — A419 Sepsis, unspecified organism: Secondary | ICD-10-CM | POA: Diagnosis not present

## 2021-07-14 DIAGNOSIS — R6520 Severe sepsis without septic shock: Secondary | ICD-10-CM | POA: Diagnosis not present

## 2021-07-14 LAB — BASIC METABOLIC PANEL
Anion gap: 13 (ref 5–15)
BUN: 48 mg/dL — ABNORMAL HIGH (ref 8–23)
CO2: 18 mmol/L — ABNORMAL LOW (ref 22–32)
Calcium: 7.5 mg/dL — ABNORMAL LOW (ref 8.9–10.3)
Chloride: 113 mmol/L — ABNORMAL HIGH (ref 98–111)
Creatinine, Ser: 2.97 mg/dL — ABNORMAL HIGH (ref 0.61–1.24)
GFR, Estimated: 21 mL/min — ABNORMAL LOW (ref >60)
Glucose, Bld: 104 mg/dL — ABNORMAL HIGH (ref 70–99)
Potassium: 4.2 mmol/L (ref 3.5–5.1)
Sodium: 144 mmol/L (ref 135–145)

## 2021-07-14 LAB — CBC
HCT: 30.6 % — ABNORMAL LOW (ref 39.0–52.0)
Hemoglobin: 9.4 g/dL — ABNORMAL LOW (ref 13.0–17.0)
MCH: 26.8 pg (ref 26.0–34.0)
MCHC: 30.7 g/dL (ref 30.0–36.0)
MCV: 87.2 fL (ref 80.0–100.0)
Platelets: 207 10*3/uL (ref 150–400)
RBC: 3.51 MIL/uL — ABNORMAL LOW (ref 4.22–5.81)
RDW: 15.6 % — ABNORMAL HIGH (ref 11.5–15.5)
WBC: 13.6 10*3/uL — ABNORMAL HIGH (ref 4.0–10.5)
nRBC: 0 % (ref 0.0–0.2)

## 2021-07-14 LAB — HEPARIN LEVEL (UNFRACTIONATED)
Heparin Unfractionated: 0.21 IU/mL — ABNORMAL LOW (ref 0.30–0.70)
Heparin Unfractionated: 0.47 IU/mL (ref 0.30–0.70)

## 2021-07-14 LAB — CK: Total CK: 51 U/L (ref 49–397)

## 2021-07-14 LAB — PHOSPHORUS: Phosphorus: 6.6 mg/dL — ABNORMAL HIGH (ref 2.5–4.6)

## 2021-07-14 LAB — MAGNESIUM: Magnesium: 1.7 mg/dL (ref 1.7–2.4)

## 2021-07-14 MED ORDER — LORAZEPAM 0.5 MG PO TABS
0.5000 mg | ORAL_TABLET | Freq: Three times a day (TID) | ORAL | Status: DC | PRN
  Administered 2021-07-15 – 2021-07-17 (×5): 0.5 mg via ORAL
  Filled 2021-07-14 (×5): qty 1

## 2021-07-14 MED ORDER — SODIUM BICARBONATE 650 MG PO TABS
650.0000 mg | ORAL_TABLET | Freq: Two times a day (BID) | ORAL | Status: DC
  Administered 2021-07-14: 650 mg via ORAL
  Filled 2021-07-14: qty 1

## 2021-07-14 MED ORDER — GABAPENTIN 300 MG PO CAPS
300.0000 mg | ORAL_CAPSULE | Freq: Two times a day (BID) | ORAL | Status: DC
  Administered 2021-07-14 – 2021-07-15 (×3): 300 mg via ORAL
  Filled 2021-07-14 (×3): qty 1

## 2021-07-14 MED ORDER — OXYCODONE HCL 5 MG PO TABS
10.0000 mg | ORAL_TABLET | Freq: Four times a day (QID) | ORAL | Status: DC | PRN
  Administered 2021-07-14 – 2021-07-16 (×7): 10 mg via ORAL
  Filled 2021-07-14 (×7): qty 2

## 2021-07-14 MED ORDER — FUROSEMIDE 10 MG/ML IJ SOLN
40.0000 mg | Freq: Once | INTRAMUSCULAR | Status: AC
  Administered 2021-07-14: 40 mg via INTRAVENOUS
  Filled 2021-07-14: qty 4

## 2021-07-14 MED ORDER — ORAL CARE MOUTH RINSE
15.0000 mL | Freq: Two times a day (BID) | OROMUCOSAL | Status: DC
  Administered 2021-07-14 – 2021-07-21 (×14): 15 mL via OROMUCOSAL

## 2021-07-14 MED ORDER — MAGNESIUM SULFATE 2 GM/50ML IV SOLN
2.0000 g | Freq: Once | INTRAVENOUS | Status: AC
  Administered 2021-07-14: 2 g via INTRAVENOUS
  Filled 2021-07-14: qty 50

## 2021-07-14 NOTE — Progress Notes (Signed)
ANTICOAGULATION CONSULT NOTE ? ?Pharmacy Consult for heparin ?Indication: pulmonary embolus ? ?Allergies  ?Allergen Reactions  ? Influenza Virus Vacc Split Pf Hives  ? Dye Fdc Red [Red Dye]   ? ? ?Patient Measurements: ?Height: '5\' 5"'$  (165.1 cm) ?Weight: 98 kg (216 lb 0.8 oz) ?IBW/kg (Calculated) : 61.5 ?HEPARIN DW (KG): 82.1  ? ?Labs: ?Recent Labs  ?  07/12/21 ?0306 07/12/21 ?1143 07/12/21 ?2023 07/13/21 ?0350 07/13/21 ?1635 07/14/21 ?0318 07/14/21 ?1300  ?HGB 10.9* 10.0* 9.9* 9.5*  --  9.4*  --   ?HCT 34.2* 31.4* 32.0* 31.7*  --  30.6*  --   ?PLT 243 236 241 236  --  207  --   ?APTT  --  47*  --   --   --   --   --   ?LABPROT  --  17.7*  --   --   --   --   --   ?INR  --  1.5*  --   --   --   --   --   ?HEPARINUNFRC 0.42  --  0.19* 0.17* 0.37 0.21* 0.47  ?CREATININE 2.07*  --   --  2.34*  --  2.97*  --   ? ? ? ?Estimated Creatinine Clearance: 22.4 mL/min (A) (by C-G formula based on SCr of 2.97 mg/dL (H)). ? ?Assessment: ?76 year old male admitted for shortness of breath. CT Positive for acute PE with evidence of right heart strain (RV/LV Ratio = 1.3) consistent with at least submassive PE. Patient transferred from [facility] to [facility] for possible IR intervention vs. thrombolytic. Patient is s/p systemic tPA and heparin was resumed.  ? ?Heparin level 0.47 units/mL, therapeutic ?No issues with infusion or bleeding noted.  ? ?Goal of Therapy:  ?Heparin level 0.3-0.7 units/mL ?Monitor platelets by anticoagulation protocol: Yes ?  ?Plan:  ?Continue IV heparin to 1600 units/hr ?Daily CBC and heparin level while on heparin ?F/u ability to transition to [place] ? ?Thank you for allowing pharmacy to participate in this patient's care. ? ?Levonne Spiller, PharmD ?PGY1 Acute Care Resident  ?07/14/2021,1:51 PM ? ? ?

## 2021-07-14 NOTE — Progress Notes (Signed)
ANTICOAGULATION CONSULT NOTE ? ?Pharmacy Consult for heparin ?Indication: pulmonary embolus ? ?Allergies  ?Allergen Reactions  ? Influenza Virus Vacc Split Pf Hives  ? Dye Fdc Red [Red Dye]   ? ? ?Patient Measurements: ?Height: '5\' 5"'$  (165.1 cm) ?Weight: 98 kg (216 lb 0.8 oz) ?IBW/kg (Calculated) : 61.5 ?HEPARIN DW (KG): 82.1  ? ?Labs: ?Recent Labs  ?  07/12/21 ?0306 07/12/21 ?1143 07/12/21 ?2023 07/13/21 ?0350 07/13/21 ?1635 07/14/21 ?4196  ?HGB 10.9* 10.0* 9.9* 9.5*  --  9.4*  ?HCT 34.2* 31.4* 32.0* 31.7*  --  30.6*  ?PLT 243 236 241 236  --  207  ?APTT  --  47*  --   --   --   --   ?LABPROT  --  17.7*  --   --   --   --   ?INR  --  1.5*  --   --   --   --   ?HEPARINUNFRC 0.42  --  0.19* 0.17* 0.37 0.21*  ?CREATININE 2.07*  --   --  2.34*  --   --   ? ? ? ?Estimated Creatinine Clearance: 28.5 mL/min (A) (by C-G formula based on SCr of 2.34 mg/dL (H)). ? ?Assessment: ?76 year old male admitted for shortness of breath. CT Positive for acute PE with evidence of right heart strain (RV/LV Ratio = 1.3) consistent with at least submassive PE. Patient transferred from [facility] to [facility] for possible IR intervention vs. thrombolytic. Decision made per CCM to hold off until 3/5 to reassess patient after at least 24 hours on systemic heparin. ? ?Patient is s/p systemic tPA and heparin was resumed. Heparin level now below goal this morning at 0.21 units/mL. No issues with infusion nor bleeding noted. CBC stable overnight.  ? ?Goal of Therapy:  ?Heparin level 0.3-0.7 units/mL ?Monitor platelets by anticoagulation protocol: Yes ?  ?Plan:  ?Increase IV heparin to 1600 units/hr ?Recheck heparin level in 8 hours ? ?Erin Hearing PharmD., BCPS ?Clinical Pharmacist ?07/14/2021 4:23 AM  ? ?

## 2021-07-14 NOTE — Telephone Encounter (Signed)
ERROR

## 2021-07-14 NOTE — Progress Notes (Signed)
? ?NAME:  Jason Holt., MRN:  [id number], DOB:  1945-11-10, LOS: 7 ?ADMISSION DATE:  07/03/2021, CONSULTATION DATE:  2/28 ?REFERRING MD:  Wynetta Emery, CHIEF COMPLAINT:  Dyspnea  ? ?History of Present Illness:  ?76 y/o male presented to [place] on 2/28 after his family noted that his oxygenation was lower than normal at home.  He says that he didn't have any dyspnea or chest pain at the time.  He was admitted to [facility], diagnosed with pneumonia and sepsis.  He required BIPAP for several days, then has transitioned to high flow nasal cannula.  He moved to [facility] on 3/4 because his oxygenation wasn't improving and a CT angiogram was performed that showed a saddle PE.   ?He is complaining of abdominal, back and leg pain today but denies dyspnea.  He has not had a cough or mucus production.  He denies chest pain.   ? 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 ?Significant Hospital Events: ?Including procedures, antibiotic start and stop dates in addition to other pertinent events   ?2/28 admission, treatment for pneumonia with ceftriaxone/azithro, elevated troponin, treated with BIPAP ?3/1 TTE> LVEF 55-60%, mild LVH, D shaped septum, evidence of RV overload ?3/3 found to have saddle PE on CT angio, patchy ggo bilaterally; negative lower ext doppler, started on heparin ?3/4 moved to [facility] for persistent hypoxemia, remains on HHF ?3/5 no change in oxygen needs, given full dose TPA ?3/6 weaned O2 needs, tolerating diet ? ?Interim History / Subjective:  ? ?No acute events overnight. ?O2 requirements weaned down ?Tolerating diet ? ?Objective   ?Blood pressure 111/63, pulse 93, temperature 99.2 ?F (37.3 ?C), temperature source Oral, resp. rate 18, height '5\' 5"'$  (1.651 m), weight 98 kg, SpO2 94 %. ?   ?FiO2 (%):  [40 %] 40 %  ? ?Intake/Output Summary (Last 24 hours) at 07/14/2021 0757 ?Last data filed at 07/14/2021 0600 ?Gross per 24  hour  ?Intake 360.28 ml  ?Output 1000 ml  ?Net -639.72 ml  ? ?Filed Weights  ? 07/12/21 0500 07/13/21 0500 07/14/21 0500  ?Weight: 97.9 kg 98 kg 98 kg  ? ? ?Examination: ?General:  Chronically ill appearing elderly male, obese, resting comfortably in bed ?HENT: [place]/AT, moist mucous membranes, sclera anicteric ?PULM: clear to auscultation bilaterally, normal effort ?CV: rrr, no mgr ?GI: BS+, soft, nontender ?MSK: normal bulk and tone ?Extremities: trace to 1+ lower extremity edema ?Neuro: awake, alert, moving all extremities ? ? ?Resolved Hospital Problem list   ?Pneumonia ?Lactic acidosis ? ?Assessment & Plan:  ?Acute respiratory failure with hypoxemia  ?Saddle PE, submassive with RV strain  ?Elevated troponin due to RV strain ?- s/p full dose TPA 3/5 due to lack of improvement in oxygenation ?- Heparin per pharmacy protocol ?- Will try to wean off HFNC to maintain O2 sat 92-95% ?- Continue metoprolol ?- out of bed to chair today ? ?Hypertension ?- PRN hydralazine IV for sBP goal less than 145mHg ?- monitor closely since he received TPA 3/5 ?- avoid calcium channel blockers given acute right heart strain ? ?Acute Kidney Injury ?In setting of hypoxemia and right heart strain ?- Avoid nephrotoxic medications ?- Monitor renal function ?- FeNa concerning for pre-renal etiology ?- Will consider diuresis ? ?Chronic pain from prior trauma, ruptured disc ?Restart Butrans patch  ?Prn hydrocodone and tylenol ?Gabapentin continued at home dose ? ?Deconditioning ?- PT and OT evals ordered ? ?  Best Practice (right click and "Reselect all SmartList Selections" daily)  ? ?Diet/type: Regular consistency (see orders) ?DVT prophylaxis: systemic heparin ?GI prophylaxis: N/A ?Lines: N/A ?Foley:  N/A ?Code Status:  full code ?Last date of multidisciplinary goals of care discussion [several were held on 3/4.  See documentation] ? ?Labs   ?CBC: ?Recent Labs  ?Lab 07/09/21 ?0356 07/10/21 ?0405 07/11/21 ?1027 07/12/21 ?0306 07/12/21 ?1143  07/12/21 ?2023 07/13/21 ?0350 07/14/21 ?2536  ?WBC 25.5* 14.5* 15.1* 14.4* 18.6* 13.8* 13.4* 13.6*  ?NEUTROABS 21.1* 11.7* 11.7* 10.9*  --   --  10.1*  --   ?HGB 11.1* 10.8* 10.8* 10.9* 10.0* 9.9* 9.5* 9.4*  ?HCT 36.3* 34.8* 36.4* 34.2* 31.4* 32.0* 31.7* 30.6*  ?MCV 86.6 88.3 90.3 87.5 87.2 87.9 88.5 87.2  ?PLT 298 276 256 243 236 241 236 207  ? ? ?Basic Metabolic Panel: ?Recent Labs  ?Lab 07/08/21 ?6440 07/09/21 ?0356 07/10/21 ?0405 07/11/21 ?3474 07/12/21 ?2595 07/13/21 ?0350  ?NA 143 147* 151* 148* 146* 146*  ?K 4.0 3.2* 3.7 4.0 4.2 4.1  ?CL 114* 115* 117* 113* 113* 112*  ?CO2 15* 19* 23 20* 20* 21*  ?GLUCOSE 148* 124* 142* 141* 126* 121*  ?BUN 39* 42* 37* 34* 33* 38*  ?CREATININE 1.66* 1.51* 1.33* 1.62* 2.07* 2.34*  ?CALCIUM 8.3* 8.8* 8.3* 8.0* 8.0* 7.5*  ?MG 2.2 1.9 1.9  --   --   --   ? ?GFR: ?Estimated Creatinine Clearance: 28.5 mL/min (A) (by C-G formula based on SCr of 2.34 mg/dL (H)). ?Recent Labs  ?Lab 06/10/2021 ?0950 06/25/2021 ?1100 07/08/21 ?6387 07/08/21 ?0631 07/08/21 ?1116 07/09/21 ?0356 07/10/21 ?0405 07/11/21 ?5643 07/12/21 ?0306 07/12/21 ?1143 07/12/21 ?2023 07/13/21 ?0350 07/14/21 ?3295  ?PROCALCITON <0.10  --  0.30  --   --  0.23 0.18 0.21  --   --   --   --   --   ?WBC 14.1*  --   --    < >  --  25.5* 14.5* 15.1*   < > 18.6* 13.8* 13.4* 13.6*  ?LATICACIDVEN 3.2* 3.6*  --   --  1.7  --   --   --   --   --   --   --   --   ? < > = values in this interval not displayed.  ? ? ?Liver Function Tests: ?Recent Labs  ?Lab 07/09/21 ?0356 07/10/21 ?0405 07/11/21 ?1884 07/12/21 ?1660 07/13/21 ?0350  ?AST '25 21 19 '$ 14* 14*  ?ALT '22 23 22 17 15  '$ ?ALKPHOS 113 123 130* 117 112  ?BILITOT 0.1* 0.1* 0.4 <0.1* 0.4  ?PROT 7.0 6.6 6.8 5.7* 5.4*  ?ALBUMIN 3.2* 3.0* 3.0* 2.5* 2.3*  ? ?No results for input(s): LIPASE, AMYLASE in the last 168 hours. ?No results for input(s): AMMONIA in the last 168 hours. ? ?ABG ?   ?Component Value Date/Time  ? PHART 7.27 (L) 07/06/2021 0947  ? PCO2ART 31 (L) 06/28/2021 0947  ? PO2ART 59  (L) 07/04/2021 0947  ? HCO3 14.4 (L) 06/19/2021 0947  ? TCO2 25.9 03/01/2011 0429  ? ACIDBASEDEF 11.5 (H) 06/15/2021 0947  ? O2SAT 88.7 06/25/2021 0947  ?  ? ?Coagulation Profile: ?Recent Labs  ?Lab 07/01/2021 ?0950 07/10/21 ?1237 07/12/21 ?1143  ?INR 1.1 1.1 1.5*  ? ? ?Cardiac Enzymes: ?Recent Labs  ?Lab 07/06/2021 ?1104  ?CKTOTAL 62  ? ? ?HbA1C: ?Hgb A1c MFr Bld  ?Date/Time Value Ref Range Status  ?03/04/2011 02:00 PM 5.5 <5.7 % Final  ?  Comment:  ?  (  NOTE) ?                                                                      ?According to the ADA Clinical Practice Recommendations for 2011, when ?HbA1c is used as a screening test: ? >=6.5%   Diagnostic of Diabetes Mellitus ?          (if abnormal result is confirmed) ?5.7-6.4%   Increased risk of developing Diabetes Mellitus ?References:Diagnosis and Classification of Diabetes Mellitus,Diabetes ?YPPJ,0932,67(TIWPY 1):S62-S69 and Standards of Medical Care in         ?Diabetes - 2011,Diabetes Care,2011,34 (Suppl 1):S11-S61.  ? ? ?CBG: ?Recent Labs  ?Lab 06/14/2021 ?2304 07/08/21 ?0345 07/08/21 ?0720 07/08/21 ?1144 07/08/21 ?1550  ?GLUCAP 169* 143* 118* 152* 121*  ? ? ?Critical care time: 35 minutes ?  ? ?Freda Jackson, MD ?[facility] ?Office: [phone] ? ? ?See Amion for personal pager ?PCCM on call pager [phone] until 7pm. ?Please call Elink 7p-7a. [phone] ? ? ? ?

## 2021-07-14 NOTE — Progress Notes (Signed)
Patient resting comfortably on HFNC at this time. Vitals stable.  ?

## 2021-07-14 NOTE — Progress Notes (Signed)
Ok to reduce gabapentin to '300mg'$  BID due to worsening AKI per Dr Erin Fulling. ? ?Onnie Boer, PharmD, BCIDP, AAHIVP, CPP ?Infectious Disease Pharmacist ?07/14/2021 1:41 PM ? ? ?

## 2021-07-14 NOTE — Consult Note (Signed)
Referring Provider: No ref. provider found Primary Care Physician:  [facility], [facility] Primary Nephrologist:     Reason for Consultation: Acute on chronic kidney disease, maintenance of euvolemia, assessment and treatment of electrolyte and acid-base abnormalities.  HPI: This is a 76 year old gentleman who presents to [facility] 06/28/2021 with a large saddle pulmonary embolus..  He has a history of chronic lower back pain secondary to chronic lumbar disc disease and is on chronic opiate therapy, he has a history of hypertension.  His hospital course has been complicated by persistent hypoxia.  We do not have a clear baseline creatinine however appears that his creatinine 0.82 on 06/07/2019.  His creatinine on admission was 2.2 mg/dL 07/03/2021.  He had an episode of acute kidney injury in 2012 with a peak creatinine of 4.21 mg/dL.  His home medications that include the ACE inhibitor lisinopril as well as Protonix.  He also took Neurontin for chronic pain.  The does not appear to be any use nonsteroidal anti-inflammatory drugs.  A CT angiogram was performed on 07/10/2021.  This involves the use of 80 cc of Omnipaque iohexol.  There was some hemodynamic compromise.  And several blood pressures have been borderline low with some frank hypotension noted on 06/15/2021.  It appears blood pressure was 76/43 and he remained hypotensive most of the day.  He also has had some episodes of hypotension more recently and review of flowsheet does show that this has been intermittent over the past week.  Since 07/10/2021 his creatinine is increased from 1.33 to 2.97 mg/dL  Antibiotic use has included azithromycin 07/06/2021-07/11/2021                                             Rocephin 07/04/2021-07/11/2021  His creatinine has fluctuated however he does appear to have good urine output.  Blood pressure 134/67 pulse 86 temperature 98.7 O2 sats 91% on high flow nasal cannula.  Sodium 144 potassium 4.2 chloride 113  CO2 18 BUN 48 creatinine 2.97 glucose 104 calcium 7.5 phosphorus 6.6 hemoglobin 9.4.  Urinalysis 07/08/2021 showed 11-20 RBCs 0-5 WBCs urine sodium 25 and urine protein 30 mg/dL.   Past Medical History:  Diagnosis Date   Acid reflux    Chronic knee pain    Depression    Hypertension    Ruptured lumbar disc     Past Surgical History:  Procedure Laterality Date   KNEE SURGERY     LAPAROTOMY  02/18/2011   Procedure: EXPLORATORY LAPAROTOMY;  Surgeon: Jamesetta So;  Location: [facility];  Service: General;  Laterality: N/A;  Gastrorraphy    Prior to Admission medications   Medication Sig Start Date End Date Taking? Authorizing Provider  buprenorphine (BUTRANS) 5 MCG/HR PTWK Place 1 patch onto the skin once a week.   Yes [provider]  gabapentin (NEURONTIN) 300 MG capsule Take 1 capsule (300 mg total) by mouth 3 (three) times daily. 06/08/19  Yes Mercy Riding, MD  lisinopril (PRINIVIL,ZESTRIL) 5 MG tablet Take 5 mg daily by mouth.   Yes [provider]  pantoprazole (PROTONIX) 40 MG tablet Take 40 mg by mouth daily.   Yes [provider]  docusate sodium (COLACE) 100 MG capsule Take 1 capsule (100 mg total) by mouth every 12 (twelve) hours. Patient taking differently: Take 100 mg by mouth 2 (two) times daily as needed for  mild constipation or moderate constipation.  08/30/16   Mesner, [name], MD    Current Facility-Administered Medications  Medication Dose Route Frequency Provider Last Rate Last Admin   0.9 %  sodium chloride infusion   Intravenous PRN Juanito Doom, MD   Stopped at 07/12/21 2034   acetaminophen (TYLENOL) tablet 650 mg  650 mg Oral Q6H PRN Heath Lark D, DO   650 mg at 07/14/21 1504   Or   acetaminophen (TYLENOL) suppository 650 mg  650 mg Rectal Q6H PRN Manuella Ghazi, Pratik D, DO       buprenorphine (BUTRANS) 5 MCG/HR 1 patch  1 patch Transdermal Weekly Juanito Doom, MD   1 patch at 07/11/21 1505   Chlorhexidine Gluconate Cloth 2 % PADS 6  each  6 each Topical Q0600 Murlean Iba, MD   6 each at 07/14/21 1701   gabapentin (NEURONTIN) capsule 300 mg  300 mg Oral BID Pham, Minh Q, RPH-CPP       heparin ADULT infusion 100 units/mL (25000 units/219m)  1,600 Units/hr Intravenous Continuous WLyndee Leo RPH 16 mL/hr at 07/14/21 1800 1,600 Units/hr at 07/14/21 1800   hydrALAZINE (APRESOLINE) injection 20 mg  20 mg Intravenous Q4H PRN DFreddi Starr MD   20 mg at 07/13/21 07124  hydrOXYzine (ATARAX) tablet 25 mg  25 mg Oral TID PRN DFreddi Starr MD   25 mg at 07/14/21 1838   ipratropium-albuterol (DUONEB) 0.5-2.5 (3) MG/3ML nebulizer solution 3 mL  3 mL Nebulization Q6H PRN SManuella Ghazi Pratik D, DO       LORazepam (ATIVAN) tablet 0.5 mg  0.5 mg Oral Q8H PRN DFreddi Starr MD       MEDLINE mouth rinse  15 mL Mouth Rinse BID DFreddi Starr MD       metoprolol tartrate (LOPRESSOR) tablet 25 mg  25 mg Oral BID Johnson, Clanford L, MD   25 mg at 07/14/21 1001   ondansetron (ZOFRAN) tablet 4 mg  4 mg Oral Q6H PRN SManuella Ghazi Pratik D, DO       Or   ondansetron (ZOFRAN) injection 4 mg  4 mg Intravenous Q6H PRN SManuella Ghazi Pratik D, DO       oxyCODONE (Oxy IR/ROXICODONE) immediate release tablet 10 mg  10 mg Oral Q6H PRN DFreddi Starr MD   10 mg at 07/14/21 1841   pantoprazole (PROTONIX) EC tablet 40 mg  40 mg Oral Daily DFreddi Starr MD   40 mg at 07/14/21 1002    Allergies as of 06/23/2021 - Review Complete 06/17/2021  Allergen Reaction Noted   Eggs or egg-derived products Hives 02/17/2011   Influenza virus vacc split pf Hives 03/10/2011   Dye fdc red [red dye]  03/24/2017    History reviewed. No pertinent family history.  Social History   Socioeconomic History   Marital status: Widowed    Spouse name: Not on file   Number of children: Not on file   Years of education: Not on file   Highest education level: Not on file  Occupational History   Not on file  Tobacco Use   Smoking status: Never   Smokeless  tobacco: Never  Substance and Sexual Activity   Alcohol use: No   Drug use: No   Sexual activity: Not Currently  Other Topics Concern   Not on file  Social History Narrative   Not on file   Social Determinants of Health   Financial Resource Strain: Not on file  Food Insecurity: Not on file  Transportation Needs: Not on file  Physical Activity: Not on file  Stress: Not on file  Social Connections: Not on file  Intimate Partner Violence: Not on file    Review of Systems: As per HPI otherwise negative on review  Physical Exam: Vital signs in last 24 hours: Temp:  [97.8 F (36.6 C)-99.6 F (37.6 C)] 98.7 F (37.1 C) (03/07 1546) Pulse Rate:  [85-96] 89 (03/07 1900) Resp:  [12-30] 20 (03/07 1900) BP: (73-144)/(56-114) 134/67 (03/07 1900) SpO2:  [83 %-97 %] 93 % (03/07 1900) FiO2 (%):  [40 %-45 %] 45 % (03/07 1327) Weight:  [98 kg-99.6 kg] 99.6 kg (03/07 1700) Last BM Date : 07/14/21 General:   Chronically ill-appearing elderly male resting comfortably nondistressed Head:  Normocephalic and atraumatic. Eyes:  Sclera clear, no icterus.   Conjunctiva pink. Ears:  Normal auditory acuity. Nose:  No deformity, discharge,  or lesions. Mouth:  No deformity or lesions, dentition normal. Neck:  Supple; no masses or thyromegaly. JVP not elevated Lungs:  Clear throughout to auscultation.   No wheezes, crackles, or rhonchi. No acute distress. Heart:  Regular rate and rhythm; no murmurs, clicks, rubs,  or gallops. Abdomen:  Soft, nontender and nondistended. No masses, hepatosplenomegaly or hernias noted. Normal bowel sounds, without guarding, and without rebound.   Msk:  Symmetrical without gross deformities. Normal posture. Pulses:  No carotid, renal, femoral bruits. DP and PT symmetrical and equal Extremities: 1+ pitting edema Neurologic:  Alert and  oriented x4;  grossly normal neurologically. Skin:  Intact without significant lesions or rashes.      Intake/Output from previous  day: 03/06 0701 - 03/07 0700 In: 360.3 [I.V.:360.3] Out: 1150 [Urine:1150] Intake/Output this shift: No intake/output data recorded.  Lab Results: Recent Labs    07/12/21 2023 07/13/21 0350 07/14/21 0318  WBC 13.8* 13.4* 13.6*  HGB 9.9* 9.5* 9.4*  HCT 32.0* 31.7* 30.6*  PLT 241 236 207   BMET Recent Labs    07/12/21 0306 07/13/21 0350 07/14/21 0318  NA 146* 146* 144  K 4.2 4.1 4.2  CL 113* 112* 113*  CO2 20* 21* 18*  GLUCOSE 126* 121* 104*  BUN 33* 38* 48*  CREATININE 2.07* 2.34* 2.97*  CALCIUM 8.0* 7.5* 7.5*  PHOS  --   --  6.6*   LFT Recent Labs    07/13/21 0350  PROT 5.4*  ALBUMIN 2.3*  AST 14*  ALT 15  ALKPHOS 112  BILITOT 0.4   PT/INR Recent Labs    07/12/21 1143  LABPROT 17.7*  INR 1.5*   Hepatitis Panel No results for input(s): HEPBSAG, HCVAB, HEPAIGM, HEPBIGM in the last 72 hours.  Studies/Results: No results found.  Assessment/Plan: Acute kidney injury.  There does seem to be a fluctuation in creatinine this seems to be related to some hypotension.  Labs are consistent with acute tubular necrosis.  There is some mild hematuria and mild proteinuria.  He is on anticoagulation and has external urinary catheter.  I doubt this represents any sort of acute glomerular injury.  However with while bearing this in mind.  I do not think we need to engage in a serological evaluation as this would be highly inconsistent with his clinical course and his serial creatinine measurements.  He did receive IV contrast 07/10/2021 that clearly led to the increase in serum creatinine that we see from 07/10/2021-07/14/2021.  This would be consistent with contrast associated nephropathy.  We will check a renal ultrasound although  I think it is highly unlikely to find any hydronephrosis.  The treatment and management will be conservative and supportive. Hypertension/volume patient receiving IV fluids he appears to be fairly euvolemic or slightly hypervolemic at this  point. Pulmonary emboli.  Saddle emboli with hypotension and hemodynamic compromise. Anemia stable does not appear to be an issue at this time. Mild metabolic acidosis.  Would recommend oral sodium bicarbonate 650 mg twice daily. Mild hyperphosphatemia.  May be associated with his renal failure.  Would recommend checking a CPK in order to rule out rhabdomyolysis.   LOS: Joy '@TODAY''@7'$ :36 PM

## 2021-07-14 NOTE — Plan of Care (Signed)
?  Problem: Clinical Measurements: ?Goal: Respiratory complications will improve ?Outcome: Progressing ?  ?Problem: Nutrition: ?Goal: Adequate nutrition will be maintained ?Outcome: Progressing ?  ?Problem: Coping: ?Goal: Level of anxiety will decrease ?Outcome: Progressing ?  ?Problem: Elimination: ?Goal: Will not experience complications related to bowel motility ?Outcome: Progressing ?Goal: Will not experience complications related to urinary retention ?Outcome: Progressing ?  ?

## 2021-07-14 NOTE — TOC Progression Note (Signed)
Transition of Care (TOC) - Progression Note  ? ? ?Patient Details  ?Name: Jason Holt. ?MRN: [id number] ?Date of Birth: 26-Sep-1945 ? ?Transition of Care (TOC) CM/SW Contact  ?Ella Bodo, RN ?Phone Number: ?07/14/2021, 2:43 PM ? ?Clinical Narrative:    ?Pt still on HF [place] with weaning in progress.  TOC consulted for possible LTAC with continued O2 wean.  I spoke with pt's daughter and caretaker, [name], who is in agreement with this plan.  She prefers [facility] [place] for [facility].  Admissions coordinator with [facility] to initiate insurance authorization in AM; will continue to assist with coordination of LTAC transfer upon medical stability.  ? ? ?Expected Discharge Plan: [place] (LTAC) ?Barriers to Discharge: Continued Medical Work up ? ?Expected Discharge Plan and Services ?Expected Discharge Plan: [place] (LTAC) ?In-house Referral: Clinical Social Work ?Discharge Planning Services: CM Consult ?Post Acute Care Choice: Long Term Acute Care (LTAC) ?Living arrangements for the past 2 months: [place] ?                ?  ?  ?  ?  ?  ?HH Arranged: Therapist, sports, PT ?[place] Agency: [place] ?Date HH Agency Contacted: 07/10/21 ?  ?Representative spoke with at [place]: Vaughan Basta ? ? ?Social Determinants of Health (SDOH) Interventions ?  ? ?Readmission Risk Interventions ?No flowsheet data found. ? ?Reinaldo Raddle, RN, BSN  ?Trauma/Neuro ICU Case Manager ?[phone] ? ?

## 2021-07-15 ENCOUNTER — Inpatient Hospital Stay (HOSPITAL_COMMUNITY): Payer: No Typology Code available for payment source

## 2021-07-15 ENCOUNTER — Encounter (HOSPITAL_COMMUNITY): Payer: Self-pay | Admitting: Internal Medicine

## 2021-07-15 DIAGNOSIS — D72829 Elevated white blood cell count, unspecified: Secondary | ICD-10-CM

## 2021-07-15 DIAGNOSIS — J9601 Acute respiratory failure with hypoxia: Secondary | ICD-10-CM | POA: Diagnosis not present

## 2021-07-15 DIAGNOSIS — N189 Chronic kidney disease, unspecified: Secondary | ICD-10-CM | POA: Diagnosis not present

## 2021-07-15 DIAGNOSIS — F1120 Opioid dependence, uncomplicated: Secondary | ICD-10-CM

## 2021-07-15 DIAGNOSIS — N179 Acute kidney failure, unspecified: Secondary | ICD-10-CM | POA: Diagnosis not present

## 2021-07-15 DIAGNOSIS — I2699 Other pulmonary embolism without acute cor pulmonale: Secondary | ICD-10-CM

## 2021-07-15 DIAGNOSIS — J9811 Atelectasis: Secondary | ICD-10-CM

## 2021-07-15 DIAGNOSIS — R251 Tremor, unspecified: Secondary | ICD-10-CM

## 2021-07-15 DIAGNOSIS — G8929 Other chronic pain: Secondary | ICD-10-CM

## 2021-07-15 DIAGNOSIS — E872 Acidosis, unspecified: Secondary | ICD-10-CM

## 2021-07-15 DIAGNOSIS — K219 Gastro-esophageal reflux disease without esophagitis: Secondary | ICD-10-CM

## 2021-07-15 LAB — CBC
HCT: 28.2 % — ABNORMAL LOW (ref 39.0–52.0)
Hemoglobin: 8.9 g/dL — ABNORMAL LOW (ref 13.0–17.0)
MCH: 27.7 pg (ref 26.0–34.0)
MCHC: 31.6 g/dL (ref 30.0–36.0)
MCV: 87.9 fL (ref 80.0–100.0)
Platelets: 216 10*3/uL (ref 150–400)
RBC: 3.21 MIL/uL — ABNORMAL LOW (ref 4.22–5.81)
RDW: 15.7 % — ABNORMAL HIGH (ref 11.5–15.5)
WBC: 14 10*3/uL — ABNORMAL HIGH (ref 4.0–10.5)
nRBC: 0 % (ref 0.0–0.2)

## 2021-07-15 LAB — PROCALCITONIN: Procalcitonin: 0.92 ng/mL

## 2021-07-15 LAB — MRSA NEXT GEN BY PCR, NASAL: MRSA by PCR Next Gen: NOT DETECTED

## 2021-07-15 LAB — BASIC METABOLIC PANEL
Anion gap: 13 (ref 5–15)
BUN: 53 mg/dL — ABNORMAL HIGH (ref 8–23)
CO2: 17 mmol/L — ABNORMAL LOW (ref 22–32)
Calcium: 7.4 mg/dL — ABNORMAL LOW (ref 8.9–10.3)
Chloride: 108 mmol/L (ref 98–111)
Creatinine, Ser: 4.08 mg/dL — ABNORMAL HIGH (ref 0.61–1.24)
GFR, Estimated: 14 mL/min — ABNORMAL LOW (ref >60)
Glucose, Bld: 112 mg/dL — ABNORMAL HIGH (ref 70–99)
Potassium: 4 mmol/L (ref 3.5–5.1)
Sodium: 138 mmol/L (ref 135–145)

## 2021-07-15 LAB — HEPARIN LEVEL (UNFRACTIONATED): Heparin Unfractionated: 0.39 IU/mL (ref 0.30–0.70)

## 2021-07-15 MED ORDER — SODIUM CHLORIDE 0.9 % IV SOLN
2.0000 g | INTRAVENOUS | Status: DC
  Administered 2021-07-15 – 2021-07-18 (×4): 2 g via INTRAVENOUS
  Filled 2021-07-15 (×4): qty 2

## 2021-07-15 MED ORDER — SODIUM BICARBONATE 650 MG PO TABS
650.0000 mg | ORAL_TABLET | Freq: Three times a day (TID) | ORAL | Status: DC
  Administered 2021-07-15 (×3): 650 mg via ORAL
  Filled 2021-07-15 (×3): qty 1

## 2021-07-15 NOTE — Progress Notes (Signed)
ANTICOAGULATION CONSULT NOTE ? ?Pharmacy Consult for heparin ?Indication: pulmonary embolus ? ?Allergies  ?Allergen Reactions  ? Influenza Virus Vacc Split Pf Hives  ? Dye Fdc Red [Red Dye]   ? ? ?Patient Measurements: ?Height: '5\' 5"'$  (165.1 cm) ?Weight: 99.8 kg (220 lb 0.3 oz) ?IBW/kg (Calculated) : 61.5 ?HEPARIN DW (KG): 82.1  ? ?Labs: ?Recent Labs  ?  07/12/21 ?1143 07/12/21 ?2023 07/13/21 ?0350 07/13/21 ?1635 07/14/21 ?0318 07/14/21 ?1300 07/15/21 ?8889  ?HGB 10.0*   < > 9.5*  --  9.4*  --  8.9*  ?HCT 31.4*   < > 31.7*  --  30.6*  --  28.2*  ?PLT 236   < > 236  --  207  --  216  ?APTT 47*  --   --   --   --   --   --   ?LABPROT 17.7*  --   --   --   --   --   --   ?INR 1.5*  --   --   --   --   --   --   ?HEPARINUNFRC  --    < > 0.17*   < > 0.21* 0.47 0.39  ?CREATININE  --   --  2.34*  --  2.97*  --  4.08*  ?CKTOTAL  --   --   --   --   --  51  --   ? < > = values in this interval not displayed.  ? ? ? ?Estimated Creatinine Clearance: 16.5 mL/min (A) (by C-G formula based on SCr of 4.08 mg/dL (H)). ? ?Assessment: ?76 year old male admitted for shortness of breath. CT Positive for acute PE with evidence of right heart strain (RV/LV Ratio = 1.3) consistent with at least submassive PE. Patient transferred from [facility] to [facility] for possible IR intervention vs. thrombolytic. Patient is s/p systemic tPA and heparin was resumed.  ? ?Heparin level 0.39 units/mL (on 1600 units/hr) ?No issues with infusion or bleeding noted.  ? ?Goal of Therapy:  ?Heparin level 0.3-0.7 units/mL ?Monitor platelets by anticoagulation protocol: Yes ?  ?Plan:  ?Continue IV heparin to 1600 units/hr ?Daily CBC and heparin level while on heparin ?F/u ability to transition to [place] when renal function stable ? ?Thank you for allowing pharmacy to be a part of this patient?s care. ? ?Donnald Garre, PharmD ?Clinical Pharmacist ? ?Please check AMION for all [place] numbers ?After 10:00 PM, call [place] [phone] ? ? ? ?

## 2021-07-15 NOTE — Progress Notes (Addendum)
? ?NAME:  Jason Valdes., MRN:  [id number], DOB:  [birth year], LOS: 8 ?ADMISSION DATE:  06/16/2021, CONSULTATION DATE:  2/28 ?REFERRING MD:  Wynetta Emery, CHIEF COMPLAINT:  Dyspnea  ? ?History of Present Illness:  ?76 y/o male presented to [place] on 2/28 after his family noted that his oxygenation was lower than normal at home.  He says that he didn't have any dyspnea or chest pain at the time.  He was admitted to [facility], diagnosed with pneumonia and sepsis.  He required BIPAP for several days, then has transitioned to high flow nasal cannula.  He moved to [facility] on 3/4 because his oxygenation wasn't improving and a CT angiogram was performed that showed a saddle PE.   ?He is complaining of abdominal, back and leg pain today but denies dyspnea.  He has not had a cough or mucus production.  He denies chest pain.   ? 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 ?Significant Hospital Events: ?Including procedures, antibiotic start and stop dates in addition to other pertinent events   ?2/28 admission, treatment for pneumonia with ceftriaxone/azithro, elevated troponin, treated with BIPAP ?3/1 TTE> LVEF 55-60%, mild LVH, D shaped septum, evidence of RV overload ?3/3 found to have saddle PE on CT angio, patchy ggo bilaterally; negative lower ext doppler, started on heparin ?3/4 moved to [facility] for persistent hypoxemia, remains on HHF ?3/5 no change in oxygen needs, given full dose TPA ?3/6 weaned O2 needs, tolerating diet. ?3/7 nephro consulted d/t rising cr. They felt mixed picture of Hypotension and Contrast dye injury ?3/8 still on 50% heated high flow. Renal fxn worse  ? ?Interim History / Subjective:  ?Feels better  ?Objective   ?Blood pressure 115/63, pulse 88, temperature 98.3 ?F (36.8 ?C), temperature source Oral, resp. rate 19, height '5\' 5"'$  (1.651 m), weight 99.8 kg, SpO2 92 %. ?   ?FiO2 (%):  [45 %-60 %] 50 %   ? ?Intake/Output Summary (Last 24 hours) at 07/15/2021 0910 ?Last data filed at 07/15/2021 0800 ?Gross per 24 hour  ?Intake 417.4 ml  ?Output 255 ml  ?Net 162.4 ml  ? ?Filed Weights  ? 07/14/21 0500 07/14/21 1700 07/15/21 0500  ?Weight: 98 kg 99.6 kg 99.8 kg  ? ? ?Examination: ?General: This is a 76 year old male patient he is sitting in bed currently, he is in no acute distress ?HEENT: Normocephalic atraumatic no jugular venous distention appreciated ?Pulmonary: Diminished bases no accessory use still on heated high flow 50% FiO2, 30 L ?Cardiac regular rate and rhythm ?Abdomen soft not tender ?Extremities are warm dry he has pitting bilateral lower extremity edema pulses are palpable capillary refill is brisk ?Neuro awake oriented has chronic tremor ? ?Resolved Hospital Problem list   ?Pneumonia ?Lactic acidosis ?Elevated trop d/t right heart strain  ?Assessment & Plan:  ?Principal Problem: ?  Acute massive pulmonary embolism ([place]) ?Active Problems: ?  Acute respiratory failure with hypoxia ([place]) ?  Pulmonary infarct [facility]) ?  Atelectasis ?  AKI (acute kidney injury) ([place]) ?  Leukocytosis ?  Normal anion gap metabolic acidosis ?  GERD (gastroesophageal reflux disease) ?  Depression ?  Essential hypertension ?  Pressure injury of skin ?  Tremor ?  Chronic pain ?  Opioid dependence ([place]) ? ? ? ?Acute respiratory failure with hypoxemia 2/2 Saddle PE, submassive with RV strain  ?- s/p full dose TPA 3/5 due to lack of  improvement in oxygenation ?-seems like O2 needs should be better by now. Wonder how much of this is body habitus, atelectasis +/- infarct ?Plan ?Mobilize  ?Wean oxygen  ?Pulse ox ?Pulm hygiene  ?IV heparin eventually move to [place] when renal recover  ?Cxr looks like left > Right airspace disease. ? All atx vs infection/pneumonia component. Will get PCT if elevated add abx  ? ?Hypertension ?Plan ?PRN Hydralazine for SBP > 165  ?Avoiding CCB given right heart strain  ?Cont BB  ? ?Non-oliguric Acute Kidney  Injury ?Scr rising. Now being followed by Nephro in consult. They feel etiology mixed picture of decreased end-organ perfusion and IV contrast.  ?Plan ?Avoid volume depletion  ?Avoid nephrotoxins  ?Strict I&O ?Avoid hypotension  ? ?Non anion gap metabolic acidosis  ?Plan ?Monitor  ?Will add low dose bicarb replacement PO ? ?Chronic pain from prior trauma, ruptured disc ?Plan ?Restarted [place] patch  ?PRN hydrocodone ?Gabapentin at home dosing may need to reassess if renal fxn does not improve (already reduced 3/7) ? ?Deconditioning ?Plan ?PT and OT consulted.  ? ?Leukocytosis  ?Plan ?Trend cbc and fever curve ? ?Best Practice (right click and "Reselect all SmartList Selections" daily)  ? ?Diet/type: Regular consistency (see orders) ?DVT prophylaxis: systemic heparin ?GI prophylaxis: N/A ?Lines: N/A ?Foley:  N/A ?Code Status:  full code ?Last date of multidisciplinary goals of care discussion [several were held on 3/4.  See documentation] ? ? ? ?Critical care time: 32 minutes ?  ?Erick Colace ACNP-BC ?[place] ?Pager # [phone] OR # [phone] if no answer ? ? ? ? ? ?

## 2021-07-15 NOTE — Progress Notes (Signed)
[facility] ROUNDING NOTE   Subjective:   Interval History: This is a 76 year old gentleman who presented 06/22/2021 to [facility] with large saddle pulmonary embolus.  He was initially found to be in renal failure with a creatinine of 2.2 mg/dL.  This seemed to improve.  He underwent CT angiogram 07/10/2021 with the use of 80 cc omnipaque.  He had had some episodes of hypotension.  His creatinine worsened from 07/10/2021.  He is making urine.  Renal ultrasound has been ordered.  Urinalysis did show some RBCs and proteinuria although this could be consistent with some Foley trauma.  He is requiring intravenous anticoagulation.  Blood pressure 115/63 pulse 88 temperature 98.2 O2 sats 93% high flow nasal cannula  Sodium 138 potassium 4 chloride 108 CO2 17 BUN 43 creatinine 4 glucose 112 hemoglobin 8.9  Objective:  Vital signs in last 24 hours:  Temp:  [97.8 F (36.6 C)-98.7 F (37.1 C)] 98.2 F (36.8 C) (03/08 0400) Pulse Rate:  [82-93] 88 (03/08 0800) Resp:  [13-30] 19 (03/08 0800) BP: (94-135)/(59-106) 115/63 (03/08 0800) SpO2:  [83 %-95 %] 92 % (03/08 0800) FiO2 (%):  [45 %-60 %] 50 % (03/08 0357) Weight:  [99.6 kg-99.8 kg] 99.8 kg (03/08 0500)  Weight change: 1.6 kg Filed Weights   07/14/21 0500 07/14/21 1700 07/15/21 0500  Weight: 98 kg 99.6 kg 99.8 kg    Intake/Output: I/O last 3 completed shifts: In: 619.3 [I.V.:569.3; IV Piggyback:50] Out: 655 [Urine:655]   Intake/Output this shift:  Total I/O In: 15.9 [I.V.:15.9] Out: -   CVS- RRR JVP not distended RS- CTA no wheezes or rales ABD- BS present soft non-distended EXT-mild lower extremity swelling   Basic Metabolic Panel: Recent Labs  Lab 07/09/21 0356 07/10/21 0405 07/11/21 0343 07/12/21 0306 07/13/21 0350 07/14/21 0318 07/15/21 0554  NA 147* 151* 148* 146* 146* 144 138  K 3.2* 3.7 4.0 4.2 4.1 4.2 4.0  CL 115* 117* 113* 113* 112* 113* 108  CO2 19* 23 20* 20* 21* 18* 17*  GLUCOSE 124*  142* 141* 126* 121* 104* 112*  BUN 42* 37* 34* 33* 38* 48* 53*  CREATININE 1.51* 1.33* 1.62* 2.07* 2.34* 2.97* 4.08*  CALCIUM 8.8* 8.3* 8.0* 8.0* 7.5* 7.5* 7.4*  MG 1.9 1.9  --   --   --  1.7  --   PHOS  --   --   --   --   --  6.6*  --     Liver Function Tests: Recent Labs  Lab 07/09/21 0356 07/10/21 0405 07/11/21 0343 07/12/21 0306 07/13/21 0350  AST '25 21 19 '$ 14* 14*  ALT '22 23 22 17 15  '$ ALKPHOS 113 123 130* 117 112  BILITOT 0.1* 0.1* 0.4 <0.1* 0.4  PROT 7.0 6.6 6.8 5.7* 5.4*  ALBUMIN 3.2* 3.0* 3.0* 2.5* 2.3*   No results for input(s): LIPASE, AMYLASE in the last 168 hours. No results for input(s): AMMONIA in the last 168 hours.  CBC: Recent Labs  Lab 07/09/21 0356 07/10/21 0405 07/11/21 0343 07/12/21 0306 07/12/21 1143 07/12/21 2023 07/13/21 0350 07/14/21 0318 07/15/21 0554  WBC 25.5* 14.5* 15.1* 14.4* 18.6* 13.8* 13.4* 13.6* 14.0*  NEUTROABS 21.1* 11.7* 11.7* 10.9*  --   --  10.1*  --   --   HGB 11.1* 10.8* 10.8* 10.9* 10.0* 9.9* 9.5* 9.4* 8.9*  HCT 36.3* 34.8* 36.4* 34.2* 31.4* 32.0* 31.7* 30.6* 28.2*  MCV 86.6 88.3 90.3 87.5 87.2 87.9 88.5 87.2 87.9  PLT 298 276 256  243 236 241 236 207 216    Cardiac Enzymes: Recent Labs  Lab 07/14/21 1300  CKTOTAL 51    BNP: Invalid input(s): POCBNP  CBG: Recent Labs  Lab 07/08/21 1144 07/08/21 1550  GLUCAP 152* 121*    Microbiology: Results for orders placed or performed during the hospital encounter of 06/25/2021  Resp Panel by RT-PCR (Flu A&B, Covid) Nasopharyngeal Swab     Status: None   Collection Time: 07/03/2021  9:28 AM   Specimen: Nasopharyngeal Swab; Nasopharyngeal(NP) swabs in vial transport medium  Result Value Ref Range Status   SARS Coronavirus 2 by RT PCR NEGATIVE NEGATIVE Final    Comment: (NOTE) SARS-CoV-2 target nucleic acids are NOT DETECTED.  The SARS-CoV-2 RNA is generally detectable in upper respiratory specimens during the acute phase of infection. The lowest concentration of  SARS-CoV-2 viral copies this assay can detect is 138 copies/mL. A negative result does not preclude SARS-Cov-2 infection and should not be used as the sole basis for treatment or other patient management decisions. A negative result may occur with  improper specimen collection/handling, submission of specimen other than nasopharyngeal swab, presence of viral mutation(s) within the areas targeted by this assay, and inadequate number of viral copies(<138 copies/mL). A negative result must be combined with clinical observations, patient history, and epidemiological information. The expected result is Negative.  Fact Sheet for Patients:  EntrepreneurPulse.com.au  Fact Sheet for Healthcare Providers:  IncredibleEmployment.be  This test is no t yet approved or cleared by the [place] FDA and  has been authorized for detection and/or diagnosis of SARS-CoV-2 by FDA under an Emergency Use Authorization (EUA). This EUA will remain  in effect (meaning this test can be used) for the duration of the COVID-19 declaration under Section 564(b)(1) of the Act, 21 U.S.C.section 360bbb-3(b)(1), unless the authorization is terminated  or revoked sooner.       Influenza A by PCR NEGATIVE NEGATIVE Final   Influenza B by PCR NEGATIVE NEGATIVE Final    Comment: (NOTE) The Xpert Xpress SARS-CoV-2/FLU/RSV plus assay is intended as an aid in the diagnosis of influenza from Nasopharyngeal swab specimens and should not be used as a sole basis for treatment. Nasal washings and aspirates are unacceptable for Xpert Xpress SARS-CoV-2/FLU/RSV testing.  Fact Sheet for Patients: EntrepreneurPulse.com.au  Fact Sheet for Healthcare Providers: IncredibleEmployment.be  This test is not yet approved or cleared by the [place] FDA and has been authorized for detection and/or diagnosis of SARS-CoV-2 by FDA under an Emergency Use  Authorization (EUA). This EUA will remain in effect (meaning this test can be used) for the duration of the COVID-19 declaration under Section 564(b)(1) of the Act, 21 U.S.C. section 360bbb-3(b)(1), unless the authorization is terminated or revoked.  Performed at [facility], [address] [place], [place] [address]   Blood Culture (routine x 2)     Status: None   Collection Time: 06/11/2021  9:31 AM   Specimen: BLOOD  Result Value Ref Range Status   Specimen Description BLOOD BLOOD LEFT ARM  Final   Special Requests   Final    BOTTLES DRAWN AEROBIC AND ANAEROBIC Blood Culture adequate volume   Culture   Final    NO GROWTH 6 DAYS Performed at [facility], [address] [place], [place] [address]    Report Status 07/13/2021 FINAL  Final  Blood Culture (routine x 2)     Status: None   Collection Time: 06/14/2021  9:51 AM   Specimen: BLOOD  Result  Value Ref Range Status   Specimen Description BLOOD RIGHT ANTECUBITAL  Final   Special Requests   Final    BOTTLES DRAWN AEROBIC AND ANAEROBIC Blood Culture adequate volume   Culture   Final    NO GROWTH 6 DAYS Performed at [facility], [address], [place], [place] [address]    Report Status 07/13/2021 FINAL  Final  MRSA Next Gen by PCR, Nasal     Status: None   Collection Time: 07/04/2021  1:11 PM   Specimen: Nasal Mucosa; Nasal Swab  Result Value Ref Range Status   MRSA by PCR Next Gen NOT DETECTED NOT DETECTED Final    Comment: (NOTE) The GeneXpert MRSA Assay (FDA approved for NASAL specimens only), is one component of a comprehensive MRSA colonization surveillance program. It is not intended to diagnose MRSA infection nor to guide or monitor treatment for MRSA infections. Test performance is not FDA approved in patients less than 3 years old. Performed at [facility], [address] [place], [place] [address]     Coagulation Studies: Recent Labs    07/12/21 1143  LABPROT 17.7*  INR 1.5*    Urinalysis: No results  for input(s): COLORURINE, LABSPEC, PHURINE, GLUCOSEU, HGBUR, BILIRUBINUR, KETONESUR, PROTEINUR, UROBILINOGEN, NITRITE, LEUKOCYTESUR in the last 72 hours.  Invalid input(s): APPERANCEUR    Imaging: No results found.   Medications:    sodium chloride Stopped (07/12/21 2034)   heparin 1,600 Units/hr (07/15/21 0800)    buprenorphine  1 patch Transdermal Weekly   Chlorhexidine Gluconate Cloth  6 each Topical Q0600   gabapentin  300 mg Oral BID   mouth rinse  15 mL Mouth Rinse BID   metoprolol tartrate  25 mg Oral BID   pantoprazole  40 mg Oral Daily   sodium bicarbonate  650 mg Oral BID   sodium chloride, acetaminophen **OR** acetaminophen, hydrALAZINE, hydrOXYzine, ipratropium-albuterol, LORazepam, ondansetron **OR** ondansetron (ZOFRAN) IV, oxyCODONE  Assessment/ Plan:  Acute kidney injury.  This appears to be temporally related to the use of intravenous contrast and hypotension.  I believe this is the etiology of his worsening renal failure.  He continues to urinate and is nonoliguric.  Renal ultrasound is pending.  Urine microscopy is consistent with Foley trauma.  I doubt this represents an acute glomerulonephritis ANEMIA-stable at this time. MBD-mild hyperphosphatemia CPK normal range HTN/VOL-volume status slightly hypervolemic.  Not sure that I would use intravenous Lasix in the situation.  Blood pressures appear to be marginal.  No antihypertensives on hold Pulmonary embolus saddle embolus with hypotension hemodynamic compromise.  Continues to be hypotensive prognosis may be poor.    LOS: [place] '@TODAY''@8'$ :02 AM

## 2021-07-15 NOTE — Consult Note (Signed)
WOC Nurse Consult Note: ?Reason for Consult:Irritant contact dermatitis due to fecal and urinary incontinence (UI now being managed with a male external urinary incontinence device with suction, the PurWick for men). Also with small, healing Stage 3 PI to lower right buttock. Also consulted for erythema intertrigo at the bilateral inguinal, medial thigh and subpannicular areas. ?Wound type:irritant contact dermatitis ? ?ICD-10 CM Codes for Irritant Dermatitis ? ?C38F8 - Due to fecal, urinary or dual incontinence ?L30.4  - Erythema intertrigo. Also used for abrasion of the hand, chafing of the skin, dermatitis due to sweating and friction, friction dermatitis, friction eczema, and genital/thigh intertrigo.  ? ?Pressure Injury POA: Yes ?Measurement:Right lower buttock, Stage 3: 0.8cm round x 0.2cm ?Wound MCR:FVOH red, most ?Drainage (amount, consistency, odor) scant serous ?Periwound:erythematous from irritant contact dermatitis due to incontinence, surrounding skin to ulceration is soft. ?Dressing procedure/placement/frequency: ?Patient is on a mattress replacement with low air loss feature while in the ICU. His UI is being contained as noted above. The patient is incontinent of stool at the time of my assessment; a large, soft brown stool with seepage beneath silicone foam noted.  I have provided Nursing with guidance via the orders for turning and repositioning to minimize time in the supine position, bilateral pressure redistribution heel boots, topical care to the healing stage 3 pressure injury with xeroform gauze topped with a dry gauze and covered with a silicone foam. Guidance for placement of the silicone foam with the "tip" pointing away from the anus will allow for coverage of the pressure injury but with less frequent changes due to fecal soiling. The erythema intertrigo will be treated with our house antimicrobial moisture wicking textile, InterDry Kellie Simmering # [id number]) which is to be applied per  manufacturer's instructions noted below: ? ?Measure and cut length of InterDry to fit in skin folds that have skin breakdown ?Tuck InterDry fabric into skin folds in a single layer, allow for 2 inches of overhang from skin edges to allow for wicking to occur ?May remove to bathe; dry area thoroughly and then tuck into affected areas again ?Do not apply any creams or ointments when using InterDry ?DO NOT THROW AWAY FOR 5 DAYS unless soiled with stool ?DO NOT [facility] product, this will inactivate the silver in the material  ?New sheet of Interdry should be applied after 5 days of use if patient continues to have skin breakdown  ?  ?Recommend consideration of a systemic antifungal (e.g., Diflucan) unless contraindicated. If you agree, pease order. ? ?[facility] nursing team will not follow, but will remain available to this patient, the nursing and medical teams.  Please re-consult if needed. ?Thanks, ?Maudie Flakes, MSN, RN, [place], [place], CWON-AP, [place]  ?Pager# [phone]  ? ? ? ?  ?

## 2021-07-15 NOTE — Progress Notes (Signed)
Pharmacy Antibiotic Note ? ?Jason Holt. is a 76 y.o. male admitted on 06/26/2021 with pneumonia.  Pharmacy has been consulted for cefepime dosing. ? ?CXR can't exclude PNA. CCM is adding empiric cefepime.  ? ?Scr up 4.08 Crcl 17 ml/hr ? ?Plan: ?Cefepime 2g IV q24 ? ?Height: '5\' 5"'$  (165.1 cm) ?Weight: 99.8 kg (220 lb 0.3 oz) ?IBW/kg (Calculated) : 61.5 ? ?Temp (24hrs), Avg:98.2 ?F (36.8 ?C), Min:98.2 ?F (36.8 ?C), Max:98.3 ?F (36.8 ?C) ? ?Recent Labs  ?Lab 07/11/21 ?7001 07/12/21 ?0306 07/12/21 ?1143 07/12/21 ?2023 07/13/21 ?0350 07/14/21 ?7494 07/15/21 ?4967  ?WBC 15.1* 14.4* 18.6* 13.8* 13.4* 13.6* 14.0*  ?CREATININE 1.62* 2.07*  --   --  2.34* 2.97* 4.08*  ?  ?Estimated Creatinine Clearance: 16.5 mL/min (A) (by C-G formula based on SCr of 4.08 mg/dL (H)).   ? ?Allergies  ?Allergen Reactions  ? Influenza Virus Vacc Split Pf Hives  ? Dye Fdc Red [Red Dye]   ? ? ?Antimicrobials this admission: ?3/8 cefepime>> ? ?Dose adjustments this admission: ? ? ?Microbiology results: ?3/8 MRSA PCR>> ?2/28 blood>>ngtdF ?2/28 MRSA neg ? ?Onnie Boer, PharmD, BCIDP, AAHIVP, CPP ?Infectious Disease Pharmacist ?07/15/2021 4:30 PM ? ? ?

## 2021-07-15 NOTE — TOC Progression Note (Addendum)
Transition of Care (TOC) - Progression Note  ? ? ?Patient Details  ?Name: Jason Holt. ?MRN: [id number] ?Date of Birth: [birth year] ? ?Transition of Care (TOC) CM/SW Contact  ?Ella Bodo, RN ?Phone Number: ?07/15/2021, 3:53 PM ? ?Clinical Narrative:    ?Per April at [facility], patient is 70% service connected with the [place].  He is seen for primary care at the [facility], Dr. Joaquim Lai.  [facility] CSW is Gabriel Earing, pager [phone]; office # [phone], ext. [phone].   ?Spoke with [facility] Admissions Coordinator; noted patient's renal issues not improving.  She states she will hold on starting insurance authorization for 1-2 days, with hopes that this will improve.   ?Will follow/provide updates as available.  ? ? ?Expected Discharge Plan: [place] (LTAC) ?Barriers to Discharge: Continued Medical Work up ? ?Expected Discharge Plan and Services ?Expected Discharge Plan: [place] (LTAC) ?In-house Referral: Clinical Social Work ?Discharge Planning Services: CM Consult ?Post Acute Care Choice: Long Term Acute Care (LTAC) ?Living arrangements for the past 2 months: [place] ?                ?  ?  ?  ?  ?  ?HH Arranged: Therapist, sports, PT ?[place] Agency: [facility] ([place]) ?Date HH Agency Contacted: 07/10/21 ?  ?Representative spoke with at [place]: Vaughan Basta ? ? ?Social Determinants of Health (SDOH) Interventions ?  ? ?Readmission Risk Interventions ?No flowsheet data found. ? ?Reinaldo Raddle, RN, BSN  ?Trauma/Neuro ICU Case Manager ?[phone] ? ?

## 2021-07-15 NOTE — Progress Notes (Signed)
PCT slightly elevated. Looking at CXR can't exclude PNA. Will add cefepime but w/ only moderate level of confidence this is actually PNA will check MRSA PCR before adding MRSA coverage given rising creatinine. If MRSA PCR positive will add zyvox ? ?Erick Colace ACNP-BC ?[place] ?Pager # [phone] OR # [phone] if no answer ? ?

## 2021-07-15 NOTE — Progress Notes (Signed)
RT set up BIPAP and placed on patient at this time. Pt is tolerating settings well. RT will monitor as needed. ?

## 2021-07-15 NOTE — Evaluation (Signed)
Occupational Therapy Evaluation Patient Details Name: Jason Holt. MRN: [id number] DOB: 07-11-1945 Today's Date: 07/15/2021   History of Present Illness 76 year old gentleman who presented to [facility] 2/28 with pneumonia and sepsis; transfered to [facility] 3/4 for intervention of a large saddle pulmonary embolus. PMHx: Acid reflux, Chronic knee pain, Depression, Hypertension, Ruptured lumbar disc   Clinical Impression   PTA pt apparently lived alone, drove and had assistance from his daughter for cleaning, financial management and meals. Pt seen on [place] @ 25L; 40% FiO2. Pt initially saying he could not work with [place] and to come back tomorrow. PT with increased anxiety however with increased time able to mobilize to EOB with mod A +2. Once seated EOB, pt with even more anxiety and orthostatic (systolic drop form 767 to 85), complaining of dizziness, stating " I got to lay down, I got to lay down". Pt left in chair position at bed level. Educated pt that PT would follow up this pm and try again most likely with use of stedy. Given PLOF, hope to progress to participate with AIR to maximize functional level of independence to facilitate safe DC home with support of family. Acute OT to follow.      Recommendations for follow up therapy are one component of a multi-disciplinary discharge planning process, led by the attending physician.  Recommendations may be updated based on patient status, additional functional criteria and insurance authorization.   Follow Up Recommendations  Acute inpatient rehab (3hours/day)    Assistance Recommended at Discharge Frequent or constant Supervision/Assistance  Patient can return home with the following Two people to help with walking and/or transfers;Two people to help with bathing/dressing/bathroom;Direct supervision/assist for medications management;Direct supervision/assist for financial management;Assist for transportation;Help with stairs or ramp  for entrance    Functional Status Assessment  Patient has had a recent decline in their functional status and demonstrates the ability to make significant improvements in function in a reasonable and predictable amount of time.  Equipment Recommendations  BSC/3in1;Wheelchair (measurements OT);Wheelchair cushion (measurements OT)    Recommendations for Other Services Rehab consult     Precautions / Restrictions Precautions Precautions: Fall Precaution Comments: HHFNC 25L; 40% Required Braces or Orthoses:  (B prevalon) Restrictions Weight Bearing Restrictions: No      Mobility Bed Mobility Overal bed mobility: Needs Assistance Bed Mobility: Supine to Sit     Supine to sit: Mod assist, +2 for physical assistance          Transfers                   General transfer comment: unableto attempt due to comlaints of dizziness and orthostatis      Balance Overall balance assessment: Needs assistance Sitting-balance support: Feet supported, No upper extremity supported Sitting balance-Leahy Scale: Fair                                     ADL either performed or assessed with clinical judgement   ADL Overall ADL's : Needs assistance/impaired Eating/Feeding: Supervision/ safety Eating/Feeding Details (indicate cue type and reason): bilat UE tremors with eating while self feeding supported in the bed Grooming: Set up;Supervision/safety   Upper Body Bathing: Moderate assistance   Lower Body Bathing: Bed level;Total assistance   Upper Body Dressing : Maximal assistance   Lower Body Dressing: Total assistance  Functional mobility during ADLs: Maximal assistance;+2 for physical assistance General ADL Comments: significantly limited by SOB adn anxiety     Vision Baseline Vision/History: 1 Wears glasses Ability to See in Adequate Light: 0 Adequate Patient Visual Report: No change from baseline       Perception     Praxis       Pertinent Vitals/Pain Pain Assessment Pain Assessment: Faces Faces Pain Scale: Hurts even more Pain Location: bilat distal LEs Pain Descriptors / Indicators: Discomfort, Numbness, Burning Pain Intervention(s): Limited activity within patient's tolerance, Patient requesting pain meds-RN notified     Hand Dominance Right   Extremity/Trunk Assessment Upper Extremity Assessment Upper Extremity Assessment: Generalized weakness RUE Deficits / Details: full ROM, numbness/tingling LUE Deficits / Details: full ROM, numbness/tingling   Lower Extremity Assessment Lower Extremity Assessment: Defer to PT evaluation   Cervical / Trunk Assessment Cervical / Trunk Assessment: Normal;Other exceptions (increased body habitus)   Communication Communication Communication: No difficulties   Cognition Arousal/Alertness: Awake/alert Behavior During Therapy: Anxious Overall Cognitive Status: No family/caregiver present to determine baseline cognitive functioning                                 General Comments: repeated self at times; feel pt was confused and unaware of being at [facility]; yelling out for help instead of using his call bell; will further assess     General Comments  mulitple areas of skin breakdown per WOC note    Exercises Exercises: Other exercises Other Exercises Other Exercises: encouraged slow pursed lip breathing   Shoulder Instructions      Home Living Family/patient expects to be discharged to:: Private residence Living Arrangements: Alone Available Help at Discharge: Family;Available 24 hours/day (brother) Type of Home: House Home Access: Ramped entrance     Home Layout: One level     Bathroom Shower/Tub: Tub/shower unit;Walk-in shower   Bathroom Toilet: Handicapped height Bathroom Accessibility: Yes How Accessible: Accessible via walker Home Equipment: [place] (2 wheels);Cane - single point;Grab bars - tub/shower;Shower seat;Cane Environmental consultant (4 wheels)   Additional Comments: daughter lives 1 house away      Prior Functioning/Environment Prior Level of Function : Independent/Modified Independent             Mobility Comments: uses his RW; drives ADLs Comments: daughter helps with meals, finaces adn LB dressing at times, otherwise pt is indep wtih BADLs, drives        OT Problem List: Decreased strength;Decreased activity tolerance;Impaired balance (sitting and/or standing);Decreased safety awareness;Decreased cognition;Decreased knowledge of use of DME or AE;Cardiopulmonary status limiting activity;Obesity;Pain;Increased edema      OT Treatment/Interventions: Self-care/ADL training;Therapeutic exercise;DME and/or AE instruction;Therapeutic activities;Cognitive remediation/compensation;Visual/perceptual remediation/compensation;Patient/family education;Balance training    OT Goals(Current goals can be found in the care plan section) Acute Rehab OT Goals Patient Stated Goal: to get better and be able to go home with his grandson and family OT Goal Formulation: With patient Time For Goal Achievement: 07/29/21 Potential to Achieve Goals: Good  OT Frequency: Min 2X/week    Co-evaluation              AM-PAC OT "6 Clicks" Daily Activity     Outcome Measure Help from another person eating meals?: A Little Help from another person taking care of personal grooming?: A Little Help from another person toileting, which includes using toliet, bedpan, or urinal?: A Lot Help from another person bathing (including washing, rinsing, drying)?:  A Lot Help from another person to put on and taking off regular upper body clothing?: A Lot Help from another person to put on and taking off regular lower body clothing?: Total 6 Click Score: 13   End of Session Equipment Utilized During Treatment: Gait belt;Oxygen (25L; 40% FiO2) Nurse Communication: Mobility status;Other (comment) (orthostasis)  Activity Tolerance:  Treatment limited secondary to medical complications (Comment) (orthostatic and symptomatic) Patient left: in bed;with call bell/phone within reach;with bed alarm set;Other (comment);with SCD's reapplied (chair position)  OT Visit Diagnosis: Unsteadiness on feet (R26.81);Other abnormalities of gait and mobility (R26.89);Muscle weakness (generalized) (M62.81);Other symptoms and signs involving cognitive function;Dizziness and giddiness (R42);Pain Pain - Right/Left:  (B) Pain - part of body: Leg;Ankle and joints of foot                Time: 1062-6948 OT Time Calculation (min): 33 min Charges:  OT General Charges $OT Visit: 1 Visit OT Evaluation $OT Eval Moderate Complexity: 1 Mod OT Treatments $Self Care/Home Management : 8-22 mins  Maurie Boettcher, OT/L   Acute OT Clinical Specialist [place] Pager [phone] Office [phone]   [facility] 07/15/2021, 2:33 PM

## 2021-07-15 NOTE — Progress Notes (Signed)
Received call from ultrasound who stated patient needed to be NPO for renal US ordered by nephrology. Pt has had saltines and soda this morning. Tech said patient should be okay if NPO from now until around 1100. I removed food and drinks from patient's bedside table and explained procedure. Patient stated understanding. ?

## 2021-07-15 NOTE — Progress Notes (Signed)
Physical Therapy Treatment ?Patient Details ?Name: Jason Holt. ?MRN: [id number] ?DOB: August 21, 1945 ?Today's Date: 07/15/2021 ? ? ?History of Present Illness 76 year old gentleman who presented to [facility] 2/28 with pneumonia and sepsis; transfered to [facility] 3/4 for intervention of a large saddle pulmonary embolus s/p TPA on 07/12/21.  Had AKI also due to hypotension and contrast.  PMHx: Acid reflux, Chronic knee pain, Depression, Hypertension, Ruptured lumbar disc ? ?  ?PT Comments  ? ? Patient with decreased activity tolerance and needing much encouragement for OOB to chair.  He gave lots of excuses he wanted to try once then get right back in bed due to fatigue, pain, etc.  Educated on benefits of OOB.  SpO2 dropped to 87% while on 25L HHFNC during OOB transfer, but recovered quickly.  He continues to be limited due to deficits in endurance, activity tolerance, balance, and strength.  Feel he will continue to benefit from skilled PT in the acute setting and from follow up PT at [facility].     ?Recommendations for follow up therapy are one component of a multi-disciplinary discharge planning process, led by the attending physician.  Recommendations may be updated based on patient status, additional functional criteria and insurance authorization. ? ?Follow Up Recommendations ? PT at Long-term acute care hospital ?  ?  ?Assistance Recommended at Discharge Frequent or constant Supervision/Assistance  ?Patient can return home with the following A lot of help with bathing/dressing/bathroom;A lot of help with walking and/or transfers;Direct supervision/assist for medications management;Assistance with cooking/housework;Assist for transportation;Help with stairs or ramp for entrance ?  ?Equipment Recommendations ?    ?  ?Recommendations for Other Services   ? ? ?  ?Precautions / Restrictions Precautions ?Precautions: Fall ?Precaution Comments: HHFNC 25L; 40% ?Required Braces or Orthoses:  (B  prevalon) ?Restrictions ?Weight Bearing Restrictions: No  ?  ? ?Mobility ? Bed Mobility ?Overal bed mobility: Needs Assistance ?Bed Mobility: Supine to Sit ?  ?  ?Supine to sit: Mod assist, HOB elevated ?  ?  ?General bed mobility comments: assist to lift trunk, pt pivoting hips and moving legs off bed ?  ? ?Transfers ?Overall transfer level: Needs assistance ?Equipment used: 2 person hand held assist ?Transfers: Sit to/from Stand, Bed to chair/wheelchair/BSC ?Sit to Stand: +2 safety/equipment, Mod assist ?Stand pivot transfers: Mod assist, +2 safety/equipment ?  ?  ?  ?  ?  ?  ? ?Ambulation/Gait ?  ?  ?  ?  ?  ?  ?  ?General Gait Details: unable due to anxiety, SpO2 87% on 25 L HHFNC 40% FiO2 ? ? ?Stairs ?  ?  ?  ?  ?  ? ? ?Wheelchair Mobility ?  ? ?Modified Rankin (Stroke Patients Only) ?  ? ? ?  ?Balance Overall balance assessment: Needs assistance ?Sitting-balance support: Feet supported ?Sitting balance-Leahy Scale: Fair ?Sitting balance - Comments: EOB with S only for safety ?  ?Standing balance support: Bilateral upper extremity supported ?Standing balance-Leahy Scale: Poor ?Standing balance comment: UE support when on his feet ?  ?  ?  ?  ?  ?  ?  ?  ?  ?  ?  ?  ? ?  ?Cognition Arousal/Alertness: Awake/alert ?Behavior During Therapy: Anxious ?Overall Cognitive Status: No family/caregiver present to determine baseline cognitive functioning ?  ?  ?  ?  ?  ?  ?  ?  ?  ?  ?  ?  ?  ?  ?  ?  ?General Comments: distracted  by anxiety over pain, respiratory status, needs encouragement to participate ?  ?  ? ?  ?Exercises   ? ?  ?General Comments General comments (skin integrity, edema, etc.): SpO2 87% at lowest with OOB to chair on HHFNC 25L 40% FiO2 ?  ?  ? ?Pertinent Vitals/Pain Pain Assessment ?Pain Assessment: Faces ?Faces Pain Scale: Hurts even more ?Pain Location: back and legs ?Pain Descriptors / Indicators: Aching, Guarding ?Pain Intervention(s): Monitored during session, Patient requesting pain meds-RN  notified, Limited activity within patient's tolerance  ? ? ?Home Living Family/patient expects to be discharged to:: Private residence ?Living Arrangements: Alone ?Available Help at Discharge: Family;Available 24 hours/day (brother) ?Type of Home: House ?Home Access: Ramped entrance ?  ?  ?  ?Home Layout: One level ?Home Equipment: Conservation officer, nature (2 wheels);Cane - single point;Grab bars - tub/shower;Shower seat;Cane - quad;Rollator (4 wheels) ?Additional Comments: daughter lives 1 house away  ?  ?Prior Function    ?  ?  ?   ? ?PT Goals (current goals can now be found in the care plan section) Acute Rehab PT Goals ?Patient Stated Goal: return home with family to assist ?PT Goal Formulation: With patient ?Time For Goal Achievement: 07/23/21 ?Potential to Achieve Goals: Good ?Progress towards PT goals: Not progressing toward goals - comment ? ?  ?Frequency ? ? ? Min 3X/week ? ? ? ?  ?PT Plan Discharge plan needs to be updated  ? ? ?Co-evaluation   ?  ?  ?  ?  ? ?  ?AM-PAC PT "6 Clicks" Mobility   ?Outcome Measure ? Help needed turning from your back to your side while in a flat bed without using bedrails?: A Lot ?Help needed moving from lying on your back to sitting on the side of a flat bed without using bedrails?: A Lot ?Help needed moving to and from a bed to a chair (including a wheelchair)?: A Lot ?Help needed standing up from a chair using your arms (e.g., wheelchair or bedside chair)?: A Lot ?Help needed to walk in hospital room?: Total ?Help needed climbing 3-5 steps with a railing? : Total ?6 Click Score: 10 ? ?  ?End of Session Equipment Utilized During Treatment: Gait belt;Oxygen ?Activity Tolerance: Patient limited by fatigue;Treatment limited secondary to medical complications (Comment) ?Patient left: in chair;with call bell/phone within reach ?Nurse Communication: Mobility status;Need for lift equipment ?PT Visit Diagnosis: Other abnormalities of gait and mobility (R26.89);Muscle weakness (generalized)  (M62.81);Difficulty in walking, not elsewhere classified (R26.2) ?  ? ? ?Time: 1415-1500 ?PT Time Calculation (min) (ACUTE ONLY): 45 min ? ?Charges:  $Therapeutic Activity: 38-52 mins          ?          ? ?Magda Kiel, PT ?Acute Rehabilitation Services ?VZSMO:[phone] ?Office:[phone] ?07/15/2021 ? ? ? ?Reginia Naas ?07/15/2021, 3:46 PM ? ?

## 2021-07-15 NOTE — Progress Notes (Signed)
Inpatient Rehab Admissions Coordinator:  ? ?Per OT recommendations pt was screened for CIR candidacy by Shann Medal, PT, DPT.  Note pt on 25L HFNC, which is outside our scope.  Will not advise a consult for CIR at this time, but will follow for weaning and if pt able to wean to 10L or less, will rescreen.   ? ?Shann Medal, PT, DPT ?Admissions Coordinator ?[phone] ?07/15/21  ?4:10 PM ? ?

## 2021-07-16 DIAGNOSIS — Z515 Encounter for palliative care: Secondary | ICD-10-CM

## 2021-07-16 DIAGNOSIS — N179 Acute kidney failure, unspecified: Secondary | ICD-10-CM | POA: Diagnosis not present

## 2021-07-16 DIAGNOSIS — I2699 Other pulmonary embolism without acute cor pulmonale: Secondary | ICD-10-CM | POA: Diagnosis not present

## 2021-07-16 DIAGNOSIS — Z7189 Other specified counseling: Secondary | ICD-10-CM | POA: Diagnosis not present

## 2021-07-16 DIAGNOSIS — G9341 Metabolic encephalopathy: Secondary | ICD-10-CM

## 2021-07-16 LAB — POCT I-STAT 7, (LYTES, BLD GAS, ICA,H+H)
Acid-base deficit: 6 mmol/L — ABNORMAL HIGH (ref 0.0–2.0)
Bicarbonate: 20 mmol/L (ref 20.0–28.0)
Calcium, Ion: 1.17 mmol/L (ref 1.15–1.40)
HCT: 25 % — ABNORMAL LOW (ref 39.0–52.0)
Hemoglobin: 8.5 g/dL — ABNORMAL LOW (ref 13.0–17.0)
O2 Saturation: 89 %
Patient temperature: 99.2
Potassium: 4.6 mmol/L (ref 3.5–5.1)
Sodium: 141 mmol/L (ref 135–145)
TCO2: 21 mmol/L — ABNORMAL LOW (ref 22–32)
pCO2 arterial: 44 mmHg (ref 32–48)
pH, Arterial: 7.268 — ABNORMAL LOW (ref 7.35–7.45)
pO2, Arterial: 65 mmHg — ABNORMAL LOW (ref 83–108)

## 2021-07-16 LAB — CBC
HCT: 30.3 % — ABNORMAL LOW (ref 39.0–52.0)
Hemoglobin: 9.4 g/dL — ABNORMAL LOW (ref 13.0–17.0)
MCH: 27.2 pg (ref 26.0–34.0)
MCHC: 31 g/dL (ref 30.0–36.0)
MCV: 87.6 fL (ref 80.0–100.0)
Platelets: 224 10*3/uL (ref 150–400)
RBC: 3.46 MIL/uL — ABNORMAL LOW (ref 4.22–5.81)
RDW: 15.6 % — ABNORMAL HIGH (ref 11.5–15.5)
WBC: 14.4 10*3/uL — ABNORMAL HIGH (ref 4.0–10.5)
nRBC: 0 % (ref 0.0–0.2)

## 2021-07-16 LAB — BASIC METABOLIC PANEL
Anion gap: 14 (ref 5–15)
Anion gap: 14 (ref 5–15)
BUN: 60 mg/dL — ABNORMAL HIGH (ref 8–23)
BUN: 62 mg/dL — ABNORMAL HIGH (ref 8–23)
CO2: 17 mmol/L — ABNORMAL LOW (ref 22–32)
CO2: 17 mmol/L — ABNORMAL LOW (ref 22–32)
Calcium: 8 mg/dL — ABNORMAL LOW (ref 8.9–10.3)
Calcium: 8 mg/dL — ABNORMAL LOW (ref 8.9–10.3)
Chloride: 110 mmol/L (ref 98–111)
Chloride: 111 mmol/L (ref 98–111)
Creatinine, Ser: 4.9 mg/dL — ABNORMAL HIGH (ref 0.61–1.24)
Creatinine, Ser: 5.14 mg/dL — ABNORMAL HIGH (ref 0.61–1.24)
GFR, Estimated: 12 mL/min — ABNORMAL LOW (ref >60)
GFR, Estimated: 11 mL/min — ABNORMAL LOW (ref >60)
Glucose, Bld: 95 mg/dL (ref 70–99)
Glucose, Bld: 91 mg/dL (ref 70–99)
Potassium: 4.6 mmol/L (ref 3.5–5.1)
Potassium: 4.6 mmol/L (ref 3.5–5.1)
Sodium: 141 mmol/L (ref 135–145)
Sodium: 142 mmol/L (ref 135–145)

## 2021-07-16 LAB — HEPARIN LEVEL (UNFRACTIONATED)
Heparin Unfractionated: <0.1 IU/mL — ABNORMAL LOW (ref 0.30–0.70)
Heparin Unfractionated: 0.49 IU/mL (ref 0.30–0.70)

## 2021-07-16 MED ORDER — LACTATED RINGERS IV SOLN: INTRAVENOUS | Status: DC

## 2021-07-16 MED ORDER — SODIUM BICARBONATE 650 MG PO TABS
1300.0000 mg | ORAL_TABLET | Freq: Three times a day (TID) | ORAL | Status: DC
  Administered 2021-07-16 (×3): 1300 mg via ORAL
  Filled 2021-07-16 (×2): qty 2

## 2021-07-16 MED ORDER — GABAPENTIN 100 MG PO CAPS
200.0000 mg | ORAL_CAPSULE | Freq: Two times a day (BID) | ORAL | Status: DC
Start: 2021-07-16 — End: 2021-07-17
  Administered 2021-07-16 (×2): 200 mg via ORAL
  Filled 2021-07-16 (×2): qty 2

## 2021-07-16 NOTE — Progress Notes (Signed)
Palliative: ? ?Full note to follow. ? ?Briefly, spoke with patient's daughter [name] -she is unable to be at the hospital today.  She does tell me that patient's other daughter Anderson Malta is healthcare power of attorney.  Anderson Malta will be here later this evening. ? ?We set up a meeting for 1 PM tomorrow March 10. ? ?During our conversation [name] did confirm that they would like patient to remain full code and they would also be interested in dialysis. ? ?Juel Burrow, DNP, AGNP-C ?Palliative Medicine Team ?Team Phone # [phone]  ?Pager # [phone] ? ?

## 2021-07-16 NOTE — Progress Notes (Signed)
Patient removed BIPAP and was placed back on HFNC. Pt tolerating settings well at this time. RT will monitor as needed. ?

## 2021-07-16 NOTE — Progress Notes (Signed)
Pt placed back on HHFNC 40L 60%. Pt kept taking BiPAP mask off and is not tolerating therapy. RT will continue to monitor. MD made aware. ?

## 2021-07-16 NOTE — Progress Notes (Addendum)
Pt placed on BIPAP 10/5 due to pt becoming more sleepy. Pt is tolerating well at this time.  ?

## 2021-07-16 NOTE — Progress Notes (Signed)
Physical Therapy Treatment ?Patient Details ?Name: Jason Holt. ?MRN: [id number] ?DOB: 02/18/1946 ?Today's Date: 07/16/2021 ? ? ?History of Present Illness 76 year old gentleman who presented to [facility] 2/28 with pneumonia and sepsis; transfered to [facility] 3/4 for intervention of a large saddle pulmonary embolus s/p TPA on 07/12/21.  Had AKI also due to hypotension and contrast.  PMHx: Acid reflux, Chronic knee pain, Depression, Hypertension, Ruptured lumbar disc ? ?  ?PT Comments  ? ? Patient progressing slowly due to increased O2 requirement still with some desaturations during mobility on 30L HHFNC, 45% FiO2.  He was able to stand x 2 with min A of 2 for safety, but could not take steps.  He is tremulous throughout session and appears confused with RN reporting confusion due to decreased renal clearance.  PT will continue to follow.  Still seems most appropriate for LTACH with PT following at d/c.   ?Recommendations for follow up therapy are one component of a multi-disciplinary discharge planning process, led by the attending physician.  Recommendations may be updated based on patient status, additional functional criteria and insurance authorization. ? ?Follow Up Recommendations ? PT at Long-term acute care hospital ?  ?  ?Assistance Recommended at Discharge Frequent or constant Supervision/Assistance  ?Patient can return home with the following A lot of help with bathing/dressing/bathroom;A lot of help with walking and/or transfers;Direct supervision/assist for medications management;Assistance with cooking/housework;Assist for transportation;Help with stairs or ramp for entrance ?  ?Equipment Recommendations ? None recommended by PT  ?  ?Recommendations for Other Services   ? ? ?  ?Precautions / Restrictions Precautions ?Precautions: Fall ?Precaution Comments: HHFNC 30L; 45% FiO2  ?  ? ?Mobility ? Bed Mobility ?  ?  ?  ?  ?  ?  ?  ?General bed mobility comments: up in chair ?   ? ?Transfers ?Overall transfer level: Needs assistance ?Equipment used: 2 person hand held assist ?Transfers: Sit to/from Stand ?Sit to Stand: Min assist, +2 safety/equipment ?  ?  ?  ?  ?  ?General transfer comment: stood x 2 with assist for balance and minimal lifting ?  ? ?Ambulation/Gait ?  ?  ?  ?  ?  ?  ?  ?General Gait Details: NT on HHFNC 45% FiO2 @ 30L ? ? ?Stairs ?  ?  ?  ?  ?  ? ? ?Wheelchair Mobility ?  ? ?Modified Rankin (Stroke Patients Only) ?  ? ? ?  ?Balance Overall balance assessment: Needs assistance ?Sitting-balance support: Feet supported ?Sitting balance-Leahy Scale: Fair ?Sitting balance - Comments: edge of chair with S ?  ?Standing balance support: Bilateral upper extremity supported ?Standing balance-Leahy Scale: Poor ?Standing balance comment: stood about 15 seconds max with UE support for balance ?  ?  ?  ?  ?  ?  ?  ?  ?  ?  ?  ?  ? ?  ?Cognition Arousal/Alertness: Awake/alert ?Behavior During Therapy: Anxious ?Overall Cognitive Status: No family/caregiver present to determine baseline cognitive functioning ?  ?  ?  ?  ?  ?  ?  ?  ?  ?  ?  ?  ?  ?  ?  ?  ?  ?  ?  ? ?  ?Exercises General Exercises - Upper Extremity ?Shoulder Flexion: AAROM, 10 reps, Seated ?General Exercises - Lower Extremity ?Ankle Circles/Pumps: AROM, Both, 10 reps, Seated ?Heel Slides: Both, 10 reps, Seated ? ?  ?General Comments General comments (skin integrity, edema, etc.): SpO2  down to 84% on 30L HHFNC 45% FiO2 ?  ?  ? ?Pertinent Vitals/Pain Pain Assessment ?Facial Expression: Tense ?Body Movements: Absence of movements ?Muscle Tension: Tense, rigid ?Compliance with ventilator (intubated pts.): N/A ?Vocalization (extubated pts.): Talking in normal tone or no sound ?CPOT Total: 2 ?Pain Location: generalized ?Pain Descriptors / Indicators: Discomfort ?Pain Intervention(s): Monitored during session  ? ? ?Home Living   ?  ?  ?  ?  ?  ?  ?  ?  ?  ?   ?  ?Prior Function    ?  ?  ?   ? ?PT Goals (current goals can now be  found in the care plan section) Progress towards PT goals: Progressing toward goals (slowly) ? ?  ?Frequency ? ? ? Min 3X/week ? ? ? ?  ?PT Plan Current plan remains appropriate  ? ? ?Co-evaluation   ?  ?  ?  ?  ? ?  ?AM-PAC PT "6 Clicks" Mobility   ?Outcome Measure ? Help needed turning from your back to your side while in a flat bed without using bedrails?: A Lot ?Help needed moving from lying on your back to sitting on the side of a flat bed without using bedrails?: A Lot ?Help needed moving to and from a bed to a chair (including a wheelchair)?: A Lot ?Help needed standing up from a chair using your arms (e.g., wheelchair or bedside chair)?: A Lot ?Help needed to walk in hospital room?: Total ?Help needed climbing 3-5 steps with a railing? : Total ?6 Click Score: 10 ? ?  ?End of Session Equipment Utilized During Treatment: Gait belt;Oxygen ?Activity Tolerance: Patient limited by fatigue;Treatment limited secondary to medical complications (Comment) ?Patient left: in chair;with call bell/phone within reach ?  ?PT Visit Diagnosis: Other abnormalities of gait and mobility (R26.89);Muscle weakness (generalized) (M62.81);Difficulty in walking, not elsewhere classified (R26.2) ?  ? ? ?Time: 4680-3212 ?PT Time Calculation (min) (ACUTE ONLY): 20 min ? ?Charges:  $Therapeutic Activity: 8-22 mins          ?          ? ?Magda Kiel, PT ?Acute Rehabilitation Services ?YQMGN:[phone] ?Office:[phone] ?07/16/2021 ? ? ? ?Jason Holt ?07/16/2021, 3:06 PM ? ?

## 2021-07-16 NOTE — Progress Notes (Signed)
Patient with increasing delirium and fidgeting in chair. Trouble maintaining SpO2 above 88. Contacted RRT who increased O2 to 50%. Moved patient back to bed. VO from [place] NP to complete blood gas. RRT completing test now. ?

## 2021-07-16 NOTE — Progress Notes (Addendum)
[facility] ROUNDING NOTE   Subjective:   Interval History: This is a 76 year old gentleman who presented 06/17/2021 to [facility] with large saddle pulmonary embolus.  He was initially found to be in renal failure with a creatinine of 2.2 mg/dL.  This seemed to improve.  He underwent CT angiogram 07/10/2021 with the use of 80 cc omnipaque.  He had had some episodes of hypotension.  His creatinine worsened from 07/10/2021.  He is making urine.  Renal ultrasound has been ordered.  Urinalysis did show some RBCs and proteinuria although this could be consistent with some Foley trauma.  He is requiring intravenous anticoagulation.  Blood pressure 140/92 pulse 89 temperature 99.2 O2 sats 91% high flow nasal cannula  Sodium 141 potassium 4.6 chloride 110 CO2 17 BUN 60 creatinine 4.9 glucose 95 calcium 8.0 hemoglobin 9.4  Objective:  Vital signs in last 24 hours:  Temp:  [97.8 F (36.6 C)-99.2 F (37.3 C)] 99.2 F (37.3 C) (03/09 0800) Pulse Rate:  [73-92] 91 (03/09 0800) Resp:  [12-25] 24 (03/09 0800) BP: (100-179)/(60-121) 140/92 (03/09 0800) SpO2:  [90 %-100 %] 93 % (03/09 0800) FiO2 (%):  [45 %-50 %] 45 % (03/09 0800)  Weight change:  Filed Weights   07/14/21 0500 07/14/21 1700 07/15/21 0500  Weight: 98 kg 99.6 kg 99.8 kg    Intake/Output: I/O last 3 completed shifts: In: 655.6 [I.V.:591.3; IV Piggyback:64.3] Out: 275 [Urine:275]   Intake/Output this shift:  Total I/O In: 15.9 [I.V.:15.9] Out: -   CVS- RRR JVP not distended RS- CTA no wheezes or rales ABD- BS present soft non-distended EXT-mild lower extremity swelling   Basic Metabolic Panel: Recent Labs  Lab 07/10/21 0405 07/11/21 0343 07/12/21 0306 07/13/21 0350 07/14/21 0318 07/15/21 0554 07/16/21 0446  NA 151*   < > 146* 146* 144 138 141  K 3.7   < > 4.2 4.1 4.2 4.0 4.6  CL 117*   < > 113* 112* 113* 108 110  CO2 23   < > 20* 21* 18* 17* 17*  GLUCOSE 142*   < > 126* 121* 104* 112* 95   BUN 37*   < > 33* 38* 48* 53* 60*  CREATININE 1.33*   < > 2.07* 2.34* 2.97* 4.08* 4.90*  CALCIUM 8.3*   < > 8.0* 7.5* 7.5* 7.4* 8.0*  MG 1.9  --   --   --  1.7  --   --   PHOS  --   --   --   --  6.6*  --   --    < > = values in this interval not displayed.     Liver Function Tests: Recent Labs  Lab 07/10/21 0405 07/11/21 0343 07/12/21 0306 07/13/21 0350  AST 21 19 14* 14*  ALT '23 22 17 15  '$ ALKPHOS 123 130* 117 112  BILITOT 0.1* 0.4 <0.1* 0.4  PROT 6.6 6.8 5.7* 5.4*  ALBUMIN 3.0* 3.0* 2.5* 2.3*    No results for input(s): LIPASE, AMYLASE in the last 168 hours. No results for input(s): AMMONIA in the last 168 hours.  CBC: Recent Labs  Lab 07/10/21 0405 07/11/21 0343 07/12/21 0306 07/12/21 1143 07/12/21 2023 07/13/21 0350 07/14/21 0318 07/15/21 0554 07/16/21 0446  WBC 14.5* 15.1* 14.4*   < > 13.8* 13.4* 13.6* 14.0* 14.4*  NEUTROABS 11.7* 11.7* 10.9*  --   --  10.1*  --   --   --   HGB 10.8* 10.8* 10.9*   < > 9.9*  9.5* 9.4* 8.9* 9.4*  HCT 34.8* 36.4* 34.2*   < > 32.0* 31.7* 30.6* 28.2* 30.3*  MCV 88.3 90.3 87.5   < > 87.9 88.5 87.2 87.9 87.6  PLT 276 256 243   < > 241 236 207 216 224   < > = values in this interval not displayed.     Cardiac Enzymes: Recent Labs  Lab 07/14/21 1300  CKTOTAL 51     BNP: Invalid input(s): POCBNP  CBG: No results for input(s): GLUCAP in the last 168 hours.   Microbiology: Results for orders placed or performed during the hospital encounter of 07/05/2021  Resp Panel by RT-PCR (Flu A&B, Covid) Nasopharyngeal Swab     Status: None   Collection Time: 06/28/2021  9:28 AM   Specimen: Nasopharyngeal Swab; Nasopharyngeal(NP) swabs in vial transport medium  Result Value Ref Range Status   SARS Coronavirus 2 by RT PCR NEGATIVE NEGATIVE Final    Comment: (NOTE) SARS-CoV-2 target nucleic acids are NOT DETECTED.  The SARS-CoV-2 RNA is generally detectable in upper respiratory specimens during the acute phase of infection. The  lowest concentration of SARS-CoV-2 viral copies this assay can detect is 138 copies/mL. A negative result does not preclude SARS-Cov-2 infection and should not be used as the sole basis for treatment or other patient management decisions. A negative result may occur with  improper specimen collection/handling, submission of specimen other than nasopharyngeal swab, presence of viral mutation(s) within the areas targeted by this assay, and inadequate number of viral copies(<138 copies/mL). A negative result must be combined with clinical observations, patient history, and epidemiological information. The expected result is Negative.  Fact Sheet for Patients:  EntrepreneurPulse.com.au  Fact Sheet for Healthcare Providers:  IncredibleEmployment.be  This test is no t yet approved or cleared by the [place] FDA and  has been authorized for detection and/or diagnosis of SARS-CoV-2 by FDA under an Emergency Use Authorization (EUA). This EUA will remain  in effect (meaning this test can be used) for the duration of the COVID-19 declaration under Section 564(b)(1) of the Act, 21 U.S.C.section 360bbb-3(b)(1), unless the authorization is terminated  or revoked sooner.       Influenza A by PCR NEGATIVE NEGATIVE Final   Influenza B by PCR NEGATIVE NEGATIVE Final    Comment: (NOTE) The Xpert Xpress SARS-CoV-2/FLU/RSV plus assay is intended as an aid in the diagnosis of influenza from Nasopharyngeal swab specimens and should not be used as a sole basis for treatment. Nasal washings and aspirates are unacceptable for Xpert Xpress SARS-CoV-2/FLU/RSV testing.  Fact Sheet for Patients: EntrepreneurPulse.com.au  Fact Sheet for Healthcare Providers: IncredibleEmployment.be  This test is not yet approved or cleared by the [place] FDA and has been authorized for detection and/or diagnosis of SARS-CoV-2 by FDA under  an Emergency Use Authorization (EUA). This EUA will remain in effect (meaning this test can be used) for the duration of the COVID-19 declaration under Section 564(b)(1) of the Act, 21 U.S.C. section 360bbb-3(b)(1), unless the authorization is terminated or revoked.  Performed at [facility], [address] [place], [place] [address]   Blood Culture (routine x 2)     Status: None   Collection Time: 07/01/2021  9:31 AM   Specimen: BLOOD  Result Value Ref Range Status   Specimen Description BLOOD BLOOD LEFT ARM  Final   Special Requests   Final    BOTTLES DRAWN AEROBIC AND ANAEROBIC Blood Culture adequate volume   Culture   Final  NO GROWTH 6 DAYS Performed at [facility], [address] [place], [place] [address]    Report Status 07/13/2021 FINAL  Final  Blood Culture (routine x 2)     Status: None   Collection Time: 06/27/2021  9:51 AM   Specimen: BLOOD  Result Value Ref Range Status   Specimen Description BLOOD RIGHT ANTECUBITAL  Final   Special Requests   Final    BOTTLES DRAWN AEROBIC AND ANAEROBIC Blood Culture adequate volume   Culture   Final    NO GROWTH 6 DAYS Performed at [facility], [address], [place], [place] [address]    Report Status 07/13/2021 FINAL  Final  MRSA Next Gen by PCR, Nasal     Status: None   Collection Time: 06/17/2021  1:11 PM   Specimen: Nasal Mucosa; Nasal Swab  Result Value Ref Range Status   MRSA by PCR Next Gen NOT DETECTED NOT DETECTED Final    Comment: (NOTE) The GeneXpert MRSA Assay (FDA approved for NASAL specimens only), is one component of a comprehensive MRSA colonization surveillance program. It is not intended to diagnose MRSA infection nor to guide or monitor treatment for MRSA infections. Test performance is not FDA approved in patients less than 69 years old. Performed at [facility], [address] [place], [place] [address]   MRSA Next Gen by PCR, Nasal     Status: None   Collection Time: 07/15/21  4:48 PM   Specimen:  Nasal Mucosa; Nasal Swab  Result Value Ref Range Status   MRSA by PCR Next Gen NOT DETECTED NOT DETECTED Final    Comment: (NOTE) The GeneXpert MRSA Assay (FDA approved for NASAL specimens only), is one component of a comprehensive MRSA colonization surveillance program. It is not intended to diagnose MRSA infection nor to guide or monitor treatment for MRSA infections. Test performance is not FDA approved in patients less than 37 years old. Performed at [facility], [place] [address] [place], [place] [address]     Coagulation Studies: No results for input(s): LABPROT, INR in the last 72 hours.   Urinalysis: No results for input(s): COLORURINE, LABSPEC, PHURINE, GLUCOSEU, HGBUR, BILIRUBINUR, KETONESUR, PROTEINUR, UROBILINOGEN, NITRITE, LEUKOCYTESUR in the last 72 hours.  Invalid input(s): APPERANCEUR    Imaging: DG Chest Port 1 View  Result Date: 07/15/2021 CLINICAL DATA:  Shortness of breath EXAM: PORTABLE CHEST 1 VIEW COMPARISON:  07/10/2021 FINDINGS: Stable heart size. Low lung volumes. Worsening airspace opacities throughout the left lung and within the right lung base. Probable small bilateral pleural effusions. No pneumothorax. IMPRESSION: 1. Worsening airspace opacities throughout the left lung and within the right lung base. 2. Probable small bilateral pleural effusions. Electronically Signed   By: Davina Poke D.O.   On: 07/15/2021 09:41   VAS US RENAL ARTERY DUPLEX  Result Date: 07/15/2021 ABDOMINAL VISCERAL Patient Name:  Arianna Delsanto.  Date of Exam:   07/15/2021 Medical Rec #: [id number]                Accession #:    [id number] Date of Birth: [birth year]                Patient Gender: M Patient Age:   56 years Exam Location:  [facility] Procedure:      VAS US RENAL ARTERY DUPLEX Referring Phys: [id number] Loistine Eberlin -------------------------------------------------------------------------------- High Risk Factors: Hypertension. Other Factors: Large saddle  pulmonary embolus. Limitations: Obesity, air/bowel gas and On full O2, not able  to be in the proper position, he has a large abdominal hernia. Comparison Study: No priors. Performing Technologist: Oda Cogan RDMS, RVT  Examination Guidelines: A complete evaluation includes B-mode imaging, spectral Doppler, color Doppler, and power Doppler as needed of all accessible portions of each vessel. Bilateral testing is considered an integral part of a complete examination. Limited examinations for reoccurring indications may be performed as noted.  Duplex Findings: +--------------------+--------+--------+------+--------------+  Mesenteric           PSV cm/s EDV cm/s Plaque    Comments     +--------------------+--------+--------+------+--------------+  Aorta Mid                                     Not visualized  +--------------------+--------+--------+------+--------------+  Celiac Artery Origin                          Not visualized  +--------------------+--------+--------+------+--------------+  SMA Origin                                    Not visualized  +--------------------+--------+--------+------+--------------+    +------------------+--------+--------+--------------+  Right Renal Artery PSV cm/s EDV cm/s    Comment      +------------------+--------+--------+--------------+  Origin                               Not visualized  +------------------+--------+--------+--------------+  Proximal                             Not visualized  +------------------+--------+--------+--------------+  Mid                  128       12                    +------------------+--------+--------+--------------+  Distal                90       13                    +------------------+--------+--------+--------------+  Technologist observations: Technically limited study. Left kidney not visualized. There is a large, nonvascularized, anechoic structure with mixed echo noted in the area of left kidney.  +------------+--------+--------+----+-----------+--------+--------+---+  Right Kidney PSV cm/s EDV cm/s RI   Left Kidney PSV cm/s EDV cm/s RI   +------------+--------+--------+----+-----------+--------+--------+---+  Upper Pole                          Upper Pole                         +------------+--------+--------+----+-----------+--------+--------+---+  Mid          37       11       0.70 Mid                                +------------+--------+--------+----+-----------+--------+--------+---+  Lower Pole   35       9        0.75 Lower Pole                         +------------+--------+--------+----+-----------+--------+--------+---+  Hilar                               Hilar                              +------------+--------+--------+----+-----------+--------+--------+---+ +------------------+-----+------------------++  Right Kidney             Left Kidney          +------------------+-----+------------------++  RAR                      RAR                  +------------------+-----+------------------++  RAR (manual)             RAR (manual)         +------------------+-----+------------------++  Cortex                   Cortex               +------------------+-----+------------------++  Cortex thickness         Corex thickness      +------------------+-----+------------------++  Kidney length (cm) 11.71 Kidney length (cm)   +------------------+-----+------------------++  Summary: Renal:  Right: Abnormal right Resistive Index. Poorly visualized kidney with        no evidence of stenosis in the mid and distal segments of the        renal artery. The proximal segment was not visualized. Left:  Left kidney was not visualized.  *See table(s) above for measurements and observations.  Diagnosing physician: Monica Martinez MD  Electronically signed by Monica Martinez MD on 07/15/2021 at 1:55:36 PM.    Final      Medications:    sodium chloride Stopped (07/12/21 2034)   ceFEPime (MAXIPIME) IV Stopped  (07/15/21 1726)   heparin 1,600 Units/hr (07/16/21 0800)   lactated ringers      buprenorphine  1 patch Transdermal Weekly   Chlorhexidine Gluconate Cloth  6 each Topical Q0600   gabapentin  200 mg Oral BID   mouth rinse  15 mL Mouth Rinse BID   metoprolol tartrate  25 mg Oral BID   pantoprazole  40 mg Oral Daily   sodium bicarbonate  1,300 mg Oral TID   sodium chloride, acetaminophen **OR** acetaminophen, hydrALAZINE, hydrOXYzine, ipratropium-albuterol, LORazepam, ondansetron **OR** ondansetron (ZOFRAN) IV, oxyCODONE  Assessment/ Plan:  Acute kidney injury.  This appears to be temporally related to the use of intravenous contrast and hypotension.  I believe this is the etiology of his worsening renal failure.  He continues to urinate and is nonoliguric.  Renal ultrasound is pending.  Urine microscopy is consistent with Foley trauma.  I doubt this represents an acute glomerulonephritis.  We discussed dialysis.  Patient would prefer not to proceed with hemodialysis.  We will therefore take this off the table and continue to manage medically. ANEMIA-stable at this time. MBD-mild hyperphosphatemia CPK normal range HTN/VOL-volume status slightly hypervolemic.  Not sure that I would use intravenous Lasix in the situation.  Blood pressures appear to be marginal.  No antihypertensives on hold Pulmonary embolus saddle embolus with hypotension hemodynamic compromise.  Continues to be hypotensive prognosis may be poor.  Discussed with daughter.  She would like to have an opportunity to speak to her father about his decision not to proceed with dialysis.  Relayed information on to critical care  medicine.  If he is not showing signs of recovery in the next 24 hours we should proceed with CRRT    LOS: Gillis '@TODAY''@8'$ :46 AM

## 2021-07-16 NOTE — Progress Notes (Signed)
ANTICOAGULATION CONSULT NOTE ? ?Pharmacy Consult for heparin ?Indication: pulmonary embolus ? ?Allergies  ?Allergen Reactions  ? Influenza Virus Vacc Split Pf Hives  ? Dye Fdc Red [Red Dye]   ? ? ?Patient Measurements: ?Height: '5\' 5"'$  (165.1 cm) ?Weight: 99.8 kg (220 lb 0.3 oz) ?IBW/kg (Calculated) : 61.5 ?HEPARIN DW (KG): 82.1  ? ?Labs: ?Recent Labs  ?  07/14/21 ?0318 07/14/21 ?1300 07/15/21 ?9702 07/16/21 ?6378 07/16/21 ?0446  ?HGB 9.4*  --  8.9*  --  9.4*  ?HCT 30.6*  --  28.2*  --  30.3*  ?PLT 207  --  216  --  224  ?HEPARINUNFRC 0.21* 0.47 0.39 <0.10* 0.49  ?CREATININE 2.97*  --  4.08*  --  4.90*  ?CKTOTAL  --  51  --   --   --   ? ? ? ?Estimated Creatinine Clearance: 13.7 mL/min (A) (by C-G formula based on SCr of 4.9 mg/dL (H)). ? ?Assessment: ?76 year old male admitted for shortness of breath. CT Positive for acute PE with evidence of right heart strain (RV/LV Ratio = 1.3) consistent with at least submassive PE. Patient transferred from [facility] to [facility] for possible IR intervention vs. thrombolytic. Patient is s/p systemic tPA and heparin was resumed.  ? ?Heparin level of 0.49 is therapeutic on heparin 1600 units/hr. Hgb 9.4. Plt wnl. No bleeding noted per RN.  ? ?Goal of Therapy:  ?Heparin level 0.3-0.7 units/mL ?Monitor platelets by anticoagulation protocol: Yes ?  ?Plan:  ?Continue IV heparin 1600 units/hr ?Daily CBC and heparin level while on heparin ?F/u ability to transition to [place] when renal function stable ? ?Thank you for allowing pharmacy to be a part of this patient?s care. ? ?Cristela Felt, PharmD, BCPS ?Clinical Pharmacist ?07/16/2021 7:45 AM ? ? ? ? ?

## 2021-07-16 NOTE — Progress Notes (Addendum)
? ?NAME:  Jason Attia., MRN:  [id number], DOB:  June 30, 1945, LOS: 9 ?ADMISSION DATE:  06/15/2021, CONSULTATION DATE:  2/28 ?REFERRING MD:  Wynetta Emery, CHIEF COMPLAINT:  Dyspnea  ? ?History of Present Illness:  ?76 y/o male presented to [facility] on 2/28 after his family noted that his oxygenation was lower than normal at home.  He says that he didn't have any dyspnea or chest pain at the time.  He was admitted to [facility], diagnosed with pneumonia and sepsis.  He required BIPAP for several days, then has transitioned to high flow nasal cannula.  He moved to [facility] on 3/4 because his oxygenation wasn't improving and a CT angiogram was performed that showed a saddle PE.   ?He is complaining of abdominal, back and leg pain today but denies dyspnea.  He has not had a cough or mucus production.  He denies chest pain.   ? 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 ?Significant Hospital Events: ?Including procedures, antibiotic start and stop dates in addition to other pertinent events   ?2/28 admission, treatment for pneumonia with ceftriaxone/azithro, elevated troponin, treated with BIPAP ?3/1 TTE> LVEF 55-60%, mild LVH, D shaped septum, evidence of RV overload ?3/3 found to have saddle PE on CT angio, patchy ggo bilaterally; negative lower ext doppler, started on heparin ?3/4 moved to [facility] for persistent hypoxemia, remains on HHF ?3/5 no change in oxygen needs, given full dose TPA ?3/6 weaned O2 needs, tolerating diet. ?3/7 nephro consulted d/t rising cr. They felt mixed picture of Hypotension and Contrast dye injury.  Received Lasix IV. ?3/8 still on 50% heated high flow. Renal fxn worse.  Added cefepime.  For possible hcap, MRSA PCR again negative.  Bicarbonate supplementation started ?3/9: Renal function continues to worsen.  Urine output dropping.  Increasing bicarbonate, adding back gentle IV hydration.  No improvement  in oxygen requirements ? ?Interim History / Subjective:  ?Still on 30 L and 45%.  Gets hypoxic easily.  More confused today ? ?Objective   ?Blood pressure (Abnormal) 140/92, pulse 91, temperature 99.2 ?F (37.3 ?C), temperature source Oral, resp. rate (Abnormal) 24, height '5\' 5"'$  (1.651 m), weight 99.8 kg, SpO2 93 %. ?   ?FiO2 (%):  [45 %-50 %] 45 %  ? ?Intake/Output Summary (Last 24 hours) at 07/16/2021 0825 ?Last data filed at 07/16/2021 0800 ?Gross per 24 hour  ?Intake 447.82 ml  ?Output 150 ml  ?Net 297.82 ml  ? ?Filed Weights  ? 07/14/21 0500 07/14/21 1700 07/15/21 0500  ?Weight: 98 kg 99.6 kg 99.8 kg  ? ? ?Examination: ?General this is acute on chronically ill-appearing 76 year old male who remains on heated high flow, today he is more delirious ?HEENT normocephalic atraumatic no jugular venous distention appreciated ?Pulmonary: Diminished bases occasional rhonchi tachypneic currently 30 L supplemental high flow with 45% FiO2 ?Portable chest x-ray when obtained on 3/8 showed fairly significant increase in left-sided airspace disease that looks to be a mix of atelectasis, possible pneumonia, and cannot exclude of pulmonary infarct ?Heart regular rhythm ?Abdomen soft not tender ?Extremities diffuse anasarca pulses palpable scattered areas of ecchymosis ?Neuro remains tremulous, anxious at times.  Confused today, generalized weakness ? ?Resolved Hospital Problem list   ?Pneumonia ?Lactic acidosis ?Elevated trop d/t right heart strain  ?Assessment & Plan:  ?Principal Problem: ?  Acute massive pulmonary embolism ([place]) ?Active Problems: ?  Acute respiratory failure with hypoxia ([place]) ?  Pulmonary infarct [facility]) ?  Atelectasis ?  AKI (acute kidney injury) ([place]) ?  Leukocytosis ?  Normal anion gap metabolic acidosis ?  GERD (gastroesophageal reflux disease) ?  Depression ?  Essential hypertension ?  Pressure injury of skin ?  Tremor ?  Chronic pain ?  Opioid dependence ([place]) ?  Acute metabolic encephalopathy ? ? ? ?Acute  respiratory failure with hypoxemia 2/2 Saddle PE, submassive with RV strain complicated by hcap(NOS) ?Plan ?Continuing to wean supplemental oxygen as able  ?Pushing pulmonary hygiene, body habitus and immobility are absolutely as significant barrier to his progression  ?Continue pulse oximetry  ?IV heparin  ?Day #2 cefepime  ?A.m. chest x-ray  ? ?Hypertension ?Plan ?Continuing beta-blockade, avoiding calcium channel blocker given right heart strain  ?As needed hydralazine  ? ?Non-oliguric Acute Kidney Injury: Felt likely secondary to mix of low endorgan perfusion and contrast mediated injury.  Serum creatinine continues to rise, urine output dropping, on exam he appears to have total body overload, however given right heart strain from PE, I suspect he still needs higher filling pressures and I worry he is intravascularly dry  ?Plan ?We will add gentle hydration & repeat chems q 12  ?Avoiding nephrotoxins ?Avoid hypotension ?Continue strict intake output ? ?Non anion gap metabolic acidosis  ?Plan ?Increase supplemental bicarbonate ?Adding gentle hydration see above ?Serial chemistries ? ?New metabolic encephalopathy superimposed on chronic pain from prior trauma, ruptured disc ?Prob slow drug clearance and worsening BUN contributing to delirium  ?Plan ?Continue [place] patch, as needed hydrocodone.   ?His gabapentin has been reduced on 3/7 given renal function, we can slowly continue to wean this down, will reduce dose again today  ?Stop Norco  ?Renal spoke to pt before he was delirious->he did not want HD  ? ?Deconditioning ?Plan ?Physical therapy and Occupational Therapy consulted, ?At this point oxygen requirements prevent him from being a CIR candidate, hopefully this will continue to improve over the next few days ? ?Leukocytosis  ?He remains afebrile, procalcitonin was slightly elevated on 3/8, white blood cell count about the same.   ?See antibiotic discussion above  ?Continue to trend CBC  ? ? ?Best Practice  (right click and "Reselect all SmartList Selections" daily)  ? ?Diet/type: Regular consistency (see orders) ?DVT prophylaxis: systemic heparin ?GI prophylaxis: N/A ?Lines: N/A ?Foley:  N/A ?Code Status:  full code ?Last date of multidisciplinary goals of care discussion [several were held on 3/4.  See documentation] ? ? ? ?Critical care time: 32 minutes ?  ?Erick Colace ACNP-BC ?[place] ?Pager # [phone] OR # [phone] if no answer ? ? ? ? ? ?

## 2021-07-16 NOTE — Consult Note (Signed)
?Consultation Note ?Date: 07/16/2021  ? ?Patient Name: Jason Holt.  ?DOB: 1945-12-31  MRN: [id number]  Age / Sex: 76 y.o., male  ?PCP: [facility] ?Referring Physician: Freddi Starr, MD ? ?Reason for Consultation: Establishing goals of care ? ?HPI/Patient Profile: 76 y.o. male  with past medical history of chronic low back pain, hypertension, GERD, and chronic knee pain admitted on 06/12/2021 for low oxygen.  He was initially diagnosed with pneumonia and sepsis and also required BiPAP for several days.  He was transferred to [facility] March 4 and found to have a saddle PE.  On March 7 he was found to have rising creatinine and kidney function has continued to worsen.  On March 9 his mental status became more altered.  PMT consulted to discuss goals of care. ? ?Clinical Assessment and Goals of Care: ?I have reviewed medical records including EPIC notes, labs and imaging, received report from [facility], assessed the patient and then spoke with patient's daughter Jason Holt to discuss diagnosis prognosis, [place], EOL wishes, disposition and options. ? ?I introduced Palliative Medicine as specialized medical care for people living with serious illness. It focuses on providing relief from the symptoms and stress of a serious illness. The goal is to improve quality of life for both the patient and the family. ? ?Jason Holt shares that patient has another daughter Jason Holt - she shares that [place] of attorney.  Jason Holt shares that Jason Holt will be at the hospital this evening.  Jason Holt tells me she serves as primary caregiver for the patient. ? ?Jason Holt shares that patient has not been feeling well for a while but initially she could not get him to go to the hospital. ? ?Jason Holt describes the patient as "tough" -she tells me about his hospitalization in 2012.  She tells me during that time he was on the ventilator for 3 weeks and was  also on dialysis for 2 weeks.  She tells me how critically ill he was during that time and they were worried he would not survive but he did.  They are hopeful for the same outcome this hospitalization. ? ?We discussed that the patient has indicated to others that he would not want to undergo dialysis again.  Stanton Kidney does tells me she does not believe the patient understood the consequences of what he was saying. ? ?Jason Holt shares that they would like the patient to remain full code and they are also interested in pursuing dialysis. ? ?We discussed the need for further conversation and we scheduled a family meeting with her and her Sister Jason Holt for tomorrow March 10 at 1 PM. ? ?Discussed with Jason Holt the importance of continued conversation with family and the medical providers regarding overall plan of care and treatment options, ensuring decisions are within the context of the patient?s values and GOCs.   ? ?Questions and concerns were addressed. The family was encouraged to call with questions or concerns. ? ?Primary Decision Maker ?HCPOA-daughter Jason Holt ?  ? ?SUMMARY OF RECOMMENDATIONS   ?Family meeting scheduled for March 10 at 1 PM ?At this point family interested in full scope full code care ? ?  ? ?Primary Diagnoses: ?Present on Admission: ? Essential hypertension ? Depression ? GERD (gastroesophageal reflux disease) ? Acute respiratory failure with hypoxia ([place]) ? AKI (acute kidney injury) ([place]) ? (Resolved) Elevated troponin ? Pressure injury of skin ? Leukocytosis ? Acute massive pulmonary embolism ([place]) ? ? ?I have reviewed the medical record, interviewed the patient and  family, and examined the patient. The following aspects are pertinent. ? ?Past Medical History:  ?Diagnosis Date  ? Acid reflux   ? Chronic knee pain   ? Depression   ? Elevated troponin 06/11/2021  ? Hypertension   ? Ruptured lumbar disc   ? ?Social History  ? ?Socioeconomic History  ? Marital status: Widowed  ?  Spouse name: Not on  file  ? Number of children: Not on file  ? Years of education: Not on file  ? Highest education level: Not on file  ?Occupational History  ? Not on file  ?Tobacco Use  ? Smoking status: Never  ? Smokeless tobacco: Never  ?Substance and Sexual Activity  ? Alcohol use: No  ? Drug use: No  ? Sexual activity: Not Currently  ?Other Topics Concern  ? Not on file  ?Social History Narrative  ? Not on file  ? ?Social Determinants of Health  ? ?Financial Resource Strain: Not on file  ?Food Insecurity: Not on file  ?Transportation Needs: Not on file  ?Physical Activity: Not on file  ?Stress: Not on file  ?Social Connections: Not on file  ? ?History reviewed. No pertinent family history. ?Scheduled Meds: ? buprenorphine  1 patch Transdermal Weekly  ? Chlorhexidine Gluconate Cloth  6 each Topical Q0600  ? gabapentin  200 mg Oral BID  ? mouth rinse  15 mL Mouth Rinse BID  ? metoprolol tartrate  25 mg Oral BID  ? pantoprazole  40 mg Oral Daily  ? sodium bicarbonate  1,300 mg Oral TID  ? ?Continuous Infusions: ? sodium chloride Stopped (07/12/21 2034)  ? ceFEPime (MAXIPIME) IV Stopped (07/15/21 1726)  ? heparin 1,600 Units/hr (07/16/21 1600)  ? lactated ringers 75 mL/hr at 07/16/21 1600  ? ?PRN Meds:.sodium chloride, acetaminophen **OR** acetaminophen, hydrALAZINE, hydrOXYzine, ipratropium-albuterol, LORazepam, ondansetron **OR** ondansetron (ZOFRAN) IV ?Allergies  ?Allergen Reactions  ? Influenza Virus Vacc Split Pf Hives  ? Dye Fdc Red [Red Dye]   ? ?Review of Systems  ?Unable to perform ROS: Mental status change  ? ?Physical Exam ?Constitutional:   ?   Comments: Lethargic, confused, slightly agitated ?Mitts in place  ?Pulmonary:  ?   Effort: Pulmonary effort is normal.  ?Skin: ?   General: Skin is warm and dry.  ?Neurological:  ?   Mental Status: He is disoriented.  ? ? ?Vital Signs: BP (!) 126/57 (BP Location: Left Arm)   Pulse 84   Temp 99.3 ?F (37.4 ?C) (Oral)   Resp 16   Ht '5\' 5"'$  (1.651 m)   Wt 99.8 kg   SpO2 93%    BMI 36.61 kg/m?  ?Pain Scale: CPOT ?POSS *See Group Information*: 1-Acceptable,Awake and alert ?Pain Score: 3  ? ? ?SpO2: SpO2: 93 % ?O2 Device:SpO2: 93 % ?O2 Flow Rate: .O2 Flow Rate (L/min): 30 L/min ? ?IO: Intake/output summary:  ?Intake/Output Summary (Last 24 hours) at 07/16/2021 1628 ?Last data filed at 07/16/2021 1600 ?Gross per 24 hour  ?Intake 883.26 ml  ?Output 150 ml  ?Net 733.26 ml  ? ? ?LBM: Last BM Date : 07/15/21 ?Baseline Weight: Weight: 100.7 kg ?Most recent weight: Weight: 99.8 kg     ?Palliative Assessment/Data: PPS 40% ? ? ? ? ?Greater than 50%  of this time was spent counseling and coordinating care related to the above assessment and plan. ? ?Juel Burrow, DNP, AGNP-C ?Palliative Medicine Team ?[phone] ?Pager: [phone] ? ?

## 2021-07-17 ENCOUNTER — Inpatient Hospital Stay (HOSPITAL_COMMUNITY): Payer: No Typology Code available for payment source

## 2021-07-17 DIAGNOSIS — G9341 Metabolic encephalopathy: Secondary | ICD-10-CM | POA: Diagnosis not present

## 2021-07-17 DIAGNOSIS — D649 Anemia, unspecified: Secondary | ICD-10-CM

## 2021-07-17 DIAGNOSIS — N189 Chronic kidney disease, unspecified: Secondary | ICD-10-CM

## 2021-07-17 DIAGNOSIS — I2602 Saddle embolus of pulmonary artery with acute cor pulmonale: Secondary | ICD-10-CM | POA: Diagnosis not present

## 2021-07-17 DIAGNOSIS — N179 Acute kidney failure, unspecified: Secondary | ICD-10-CM

## 2021-07-17 DIAGNOSIS — I952 Hypotension due to drugs: Secondary | ICD-10-CM

## 2021-07-17 DIAGNOSIS — J9601 Acute respiratory failure with hypoxia: Secondary | ICD-10-CM | POA: Diagnosis not present

## 2021-07-17 DIAGNOSIS — Z515 Encounter for palliative care: Secondary | ICD-10-CM

## 2021-07-17 DIAGNOSIS — I2699 Other pulmonary embolism without acute cor pulmonale: Secondary | ICD-10-CM | POA: Diagnosis not present

## 2021-07-17 DIAGNOSIS — Z7189 Other specified counseling: Secondary | ICD-10-CM

## 2021-07-17 LAB — CBC
HCT: 24.6 % — ABNORMAL LOW (ref 39.0–52.0)
HCT: 26.5 % — ABNORMAL LOW (ref 39.0–52.0)
Hemoglobin: 7.8 g/dL — ABNORMAL LOW (ref 13.0–17.0)
Hemoglobin: 8.4 g/dL — ABNORMAL LOW (ref 13.0–17.0)
MCH: 27.8 pg (ref 26.0–34.0)
MCH: 27.6 pg (ref 26.0–34.0)
MCHC: 31.7 g/dL (ref 30.0–36.0)
MCHC: 31.7 g/dL (ref 30.0–36.0)
MCV: 87.5 fL (ref 80.0–100.0)
MCV: 87.2 fL (ref 80.0–100.0)
Platelets: 216 10*3/uL (ref 150–400)
Platelets: 263 10*3/uL (ref 150–400)
RBC: 2.81 MIL/uL — ABNORMAL LOW (ref 4.22–5.81)
RBC: 3.04 MIL/uL — ABNORMAL LOW (ref 4.22–5.81)
RDW: 15.6 % — ABNORMAL HIGH (ref 11.5–15.5)
RDW: 15.8 % — ABNORMAL HIGH (ref 11.5–15.5)
WBC: 12.5 10*3/uL — ABNORMAL HIGH (ref 4.0–10.5)
WBC: 15 10*3/uL — ABNORMAL HIGH (ref 4.0–10.5)
nRBC: 0 % (ref 0.0–0.2)
nRBC: 0 % (ref 0.0–0.2)

## 2021-07-17 LAB — MAGNESIUM: Magnesium: 2.1 mg/dL (ref 1.7–2.4)

## 2021-07-17 LAB — GLUCOSE, CAPILLARY
Glucose-Capillary: 117 mg/dL — ABNORMAL HIGH (ref 70–99)
Glucose-Capillary: 132 mg/dL — ABNORMAL HIGH (ref 70–99)

## 2021-07-17 LAB — HEPARIN LEVEL (UNFRACTIONATED)
Heparin Unfractionated: 0.25 IU/mL — ABNORMAL LOW (ref 0.30–0.70)
Heparin Unfractionated: 0.32 IU/mL (ref 0.30–0.70)

## 2021-07-17 LAB — BASIC METABOLIC PANEL
Anion gap: 13 (ref 5–15)
Anion gap: 15 (ref 5–15)
BUN: 65 mg/dL — ABNORMAL HIGH (ref 8–23)
BUN: 68 mg/dL — ABNORMAL HIGH (ref 8–23)
CO2: 18 mmol/L — ABNORMAL LOW (ref 22–32)
CO2: 18 mmol/L — ABNORMAL LOW (ref 22–32)
Calcium: 8 mg/dL — ABNORMAL LOW (ref 8.9–10.3)
Calcium: 8.1 mg/dL — ABNORMAL LOW (ref 8.9–10.3)
Chloride: 111 mmol/L (ref 98–111)
Chloride: 110 mmol/L (ref 98–111)
Creatinine, Ser: 5.01 mg/dL — ABNORMAL HIGH (ref 0.61–1.24)
Creatinine, Ser: 4.93 mg/dL — ABNORMAL HIGH (ref 0.61–1.24)
GFR, Estimated: 11 mL/min — ABNORMAL LOW (ref >60)
GFR, Estimated: 11 mL/min — ABNORMAL LOW (ref >60)
Glucose, Bld: 101 mg/dL — ABNORMAL HIGH (ref 70–99)
Glucose, Bld: 97 mg/dL (ref 70–99)
Potassium: 4.6 mmol/L (ref 3.5–5.1)
Potassium: 4.3 mmol/L (ref 3.5–5.1)
Sodium: 142 mmol/L (ref 135–145)
Sodium: 143 mmol/L (ref 135–145)

## 2021-07-17 LAB — COOXEMETRY PANEL
Carboxyhemoglobin: 2.2 % — ABNORMAL HIGH (ref 0.5–1.5)
Methemoglobin: <0.7 % (ref 0.0–1.5)
O2 Saturation: 78.3 %
Total hemoglobin: 8.2 g/dL — ABNORMAL LOW (ref 12.0–16.0)

## 2021-07-17 LAB — PHOSPHORUS: Phosphorus: 8.1 mg/dL — ABNORMAL HIGH (ref 2.5–4.6)

## 2021-07-17 MED ORDER — SODIUM BICARBONATE 8.4 % IV SOLN
INTRAVENOUS | Status: DC
  Filled 2021-07-17 (×2): qty 1000

## 2021-07-17 MED ORDER — LIDOCAINE HCL URETHRAL/MUCOSAL 2 % EX GEL
1.0000 "application " | Freq: Once | CUTANEOUS | Status: DC
  Filled 2021-07-17: qty 6

## 2021-07-17 MED ORDER — PANTOPRAZOLE SODIUM 40 MG IV SOLR
40.0000 mg | Freq: Two times a day (BID) | INTRAVENOUS | Status: DC
  Administered 2021-07-17 – 2021-07-18 (×3): 40 mg via INTRAVENOUS
  Filled 2021-07-17 (×3): qty 10

## 2021-07-17 MED ORDER — PROSOURCE TF PO LIQD
45.0000 mL | Freq: Two times a day (BID) | ORAL | Status: DC
  Administered 2021-07-20 – 2021-07-24 (×9): 45 mL
  Filled 2021-07-17 (×9): qty 45

## 2021-07-17 MED ORDER — VITAL 1.5 CAL PO LIQD
1000.0000 mL | ORAL | Status: DC
  Administered 2021-07-17 – 2021-07-23 (×4): 1000 mL
  Filled 2021-07-17 (×3): qty 1000

## 2021-07-17 MED ORDER — VITAL HIGH PROTEIN PO LIQD: 1000.0000 mL | ORAL | Status: DC

## 2021-07-17 MED ORDER — NOREPINEPHRINE 16 MG/250ML-% IV SOLN
0.0000 ug/min | INTRAVENOUS | Status: DC
  Administered 2021-07-17: 2 ug/min via INTRAVENOUS
  Filled 2021-07-17: qty 250

## 2021-07-17 MED ORDER — DEXMEDETOMIDINE HCL IN NACL 400 MCG/100ML IV SOLN
0.2000 ug/kg/h | INTRAVENOUS | Status: DC
  Administered 2021-07-17: 0.4 ug/kg/h via INTRAVENOUS
  Administered 2021-07-17: 1 ug/kg/h via INTRAVENOUS
  Administered 2021-07-17: 0.6 ug/kg/h via INTRAVENOUS
  Administered 2021-07-17: 0.2 ug/kg/h via INTRAVENOUS
  Administered 2021-07-18: 0.7 ug/kg/h via INTRAVENOUS
  Administered 2021-07-18: 0.8 ug/kg/h via INTRAVENOUS
  Administered 2021-07-18: 1 ug/kg/h via INTRAVENOUS
  Administered 2021-07-18 – 2021-07-19 (×2): 0.6 ug/kg/h via INTRAVENOUS
  Administered 2021-07-19 (×2): 0.8 ug/kg/h via INTRAVENOUS
  Administered 2021-07-19: 0.5 ug/kg/h via INTRAVENOUS
  Administered 2021-07-20: 1 ug/kg/h via INTRAVENOUS
  Administered 2021-07-20: 0.9 ug/kg/h via INTRAVENOUS
  Administered 2021-07-20: 0.8 ug/kg/h via INTRAVENOUS
  Administered 2021-07-20: 0.6 ug/kg/h via INTRAVENOUS
  Administered 2021-07-21 (×3): 1 ug/kg/h via INTRAVENOUS
  Administered 2021-07-21 (×2): 0.8 ug/kg/h via INTRAVENOUS
  Administered 2021-07-21 – 2021-07-22 (×6): 1 ug/kg/h via INTRAVENOUS
  Administered 2021-07-23 (×2): 1.2 ug/kg/h via INTRAVENOUS
  Administered 2021-07-23: 1 ug/kg/h via INTRAVENOUS
  Administered 2021-07-23: 1.2 ug/kg/h via INTRAVENOUS
  Administered 2021-07-23 (×2): 1 ug/kg/h via INTRAVENOUS
  Administered 2021-07-24 (×6): 1.2 ug/kg/h via INTRAVENOUS
  Filled 2021-07-17 (×29): qty 100
  Filled 2021-07-17: qty 200
  Filled 2021-07-17 (×7): qty 100

## 2021-07-17 NOTE — Procedures (Signed)
Central Venous Catheter Insertion Procedure Note ?Jason Holt Jason Holt. ?[id number] ?1946/03/04 ? ?Procedure: Insertion of Central Venous Catheter ?Indications: Assessment of intravascular volume, Drug and/or fluid administration, and Frequent blood sampling ? ?Procedure Details ?Consent: Risks of procedure as well as the alternatives and risks of each were explained to the (patient/caregiver).  Consent for procedure obtained. ?Time Out: Verified patient identification, verified procedure, site/side was marked, verified correct patient position, special equipment/implants available, medications/allergies/relevent history reviewed, required imaging and test results available.  Performed ? ?Maximum sterile technique was used including antiseptics, cap, gloves, gown, hand hygiene, mask, and sheet. ?Skin prep: Chlorhexidine; local anesthetic administered ?A antimicrobial bonded/coated triple lumen catheter was placed in the left internal jugular vein using the Seldinger technique. ? ?Evaluation ?Blood flow poor ?Complications: Complications of not able to thread guidewire. Pt had extensive collaterals  ?Patient did tolerate procedure well. ?Chest X-ray ordered to verify placement.  CXR:  no complications  . ? ?Jason Holt ?07/17/2021, 11:34 AM ?  ?

## 2021-07-17 NOTE — Progress Notes (Signed)
[facility] ROUNDING NOTE   Subjective:   Interval History: This is a 76 year old gentleman who presented 06/21/2021 to [facility] with large saddle pulmonary embolus.  He was initially found to be in renal failure with a creatinine of 2.2 mg/dL.  This seemed to improve.  He underwent CT angiogram 07/10/2021 with the use of 80 cc omnipaque.  He had had some episodes of hypotension.  His creatinine worsened from 07/10/2021. Urinalysis did show some RBCs and proteinuria although this could be consistent with some Foley trauma.  He is requiring intravenous anticoagulation.  Blood pressure 98/55 pulse 70 temperature 97.8 sats 93% BiPAP  Sodium 142 potassium 4.6 chloride 111 CO2 18 BUN 65 creatinine 5 glucose 101 hemoglobin 7.8  Urine output 900 cc 07/17/2020.    Objective:  Vital signs in last 24 hours:  Temp:  [97.7 F (36.5 C)-99.3 F (37.4 C)] 97.8 F (36.6 C) (03/10 0400) Pulse Rate:  [63-91] 64 (03/10 0700) Resp:  [0-28] 22 (03/10 0700) BP: (84-170)/(46-133) 98/55 (03/10 0700) SpO2:  [88 %-100 %] 91 % (03/10 0700) FiO2 (%):  [45 %-60 %] 60 % (03/10 0000)  Weight change:  Filed Weights   07/14/21 0500 07/14/21 1700 07/15/21 0500  Weight: 98 kg 99.6 kg 99.8 kg    Intake/Output: I/O last 3 completed shifts: In: 2253 [I.V.:2153; IV Piggyback:100.1] Out: 1350 [Urine:1350]   Intake/Output this shift:  No intake/output data recorded.  CVS- RRR JVP not distended RS- CTA no wheezes or rales ABD- BS present soft non-distended EXT-mild lower extremity swelling   Basic Metabolic Panel: Recent Labs  Lab 07/14/21 0318 07/15/21 0554 07/16/21 0446 07/16/21 1159 07/16/21 1621 07/17/21 0527  NA 144 138 141 141 142 142  K 4.2 4.0 4.6 4.6 4.6 4.6  CL 113* 108 110  --  111 111  CO2 18* 17* 17*  --  17* 18*  GLUCOSE 104* 112* 95  --  91 101*  BUN 48* 53* 60*  --  62* 65*  CREATININE 2.97* 4.08* 4.90*  --  5.14* 5.01*  CALCIUM 7.5* 7.4* 8.0*  --  8.0* 8.0*   MG 1.7  --   --   --   --   --   PHOS 6.6*  --   --   --   --   --      Liver Function Tests: Recent Labs  Lab 07/11/21 0343 07/12/21 0306 07/13/21 0350  AST 19 14* 14*  ALT '22 17 15  '$ ALKPHOS 130* 117 112  BILITOT 0.4 <0.1* 0.4  PROT 6.8 5.7* 5.4*  ALBUMIN 3.0* 2.5* 2.3*    No results for input(s): LIPASE, AMYLASE in the last 168 hours. No results for input(s): AMMONIA in the last 168 hours.  CBC: Recent Labs  Lab 07/11/21 0343 07/12/21 0306 07/12/21 1143 07/13/21 0350 07/14/21 0318 07/15/21 0554 07/16/21 0446 07/16/21 1159 07/17/21 0527  WBC 15.1* 14.4*   < > 13.4* 13.6* 14.0* 14.4*  --  12.5*  NEUTROABS 11.7* 10.9*  --  10.1*  --   --   --   --   --   HGB 10.8* 10.9*   < > 9.5* 9.4* 8.9* 9.4* 8.5* 7.8*  HCT 36.4* 34.2*   < > 31.7* 30.6* 28.2* 30.3* 25.0* 24.6*  MCV 90.3 87.5   < > 88.5 87.2 87.9 87.6  --  87.5  PLT 256 243   < > 236 207 216 224  --  216   < > =  values in this interval not displayed.     Cardiac Enzymes: Recent Labs  Lab 07/14/21 1300  CKTOTAL 51     BNP: Invalid input(s): POCBNP  CBG: No results for input(s): GLUCAP in the last 168 hours.   Microbiology: Results for orders placed or performed during the hospital encounter of 07/06/2021  Resp Panel by RT-PCR (Flu A&B, Covid) Nasopharyngeal Swab     Status: None   Collection Time: 06/19/2021  9:28 AM   Specimen: Nasopharyngeal Swab; Nasopharyngeal(NP) swabs in vial transport medium  Result Value Ref Range Status   SARS Coronavirus 2 by RT PCR NEGATIVE NEGATIVE Final    Comment: (NOTE) SARS-CoV-2 target nucleic acids are NOT DETECTED.  The SARS-CoV-2 RNA is generally detectable in upper respiratory specimens during the acute phase of infection. The lowest concentration of SARS-CoV-2 viral copies this assay can detect is 138 copies/mL. A negative result does not preclude SARS-Cov-2 infection and should not be used as the sole basis for treatment or other patient management  decisions. A negative result may occur with  improper specimen collection/handling, submission of specimen other than nasopharyngeal swab, presence of viral mutation(s) within the areas targeted by this assay, and inadequate number of viral copies(<138 copies/mL). A negative result must be combined with clinical observations, patient history, and epidemiological information. The expected result is Negative.  Fact Sheet for Patients:  EntrepreneurPulse.com.au  Fact Sheet for Healthcare Providers:  IncredibleEmployment.be  This test is no t yet approved or cleared by the [place] FDA and  has been authorized for detection and/or diagnosis of SARS-CoV-2 by FDA under an Emergency Use Authorization (EUA). This EUA will remain  in effect (meaning this test can be used) for the duration of the COVID-19 declaration under Section 564(b)(1) of the Act, 21 U.S.C.section 360bbb-3(b)(1), unless the authorization is terminated  or revoked sooner.       Influenza A by PCR NEGATIVE NEGATIVE Final   Influenza B by PCR NEGATIVE NEGATIVE Final    Comment: (NOTE) The Xpert Xpress SARS-CoV-2/FLU/RSV plus assay is intended as an aid in the diagnosis of influenza from Nasopharyngeal swab specimens and should not be used as a sole basis for treatment. Nasal washings and aspirates are unacceptable for Xpert Xpress SARS-CoV-2/FLU/RSV testing.  Fact Sheet for Patients: EntrepreneurPulse.com.au  Fact Sheet for Healthcare Providers: IncredibleEmployment.be  This test is not yet approved or cleared by the [place] FDA and has been authorized for detection and/or diagnosis of SARS-CoV-2 by FDA under an Emergency Use Authorization (EUA). This EUA will remain in effect (meaning this test can be used) for the duration of the COVID-19 declaration under Section 564(b)(1) of the Act, 21 U.S.C. section 360bbb-3(b)(1), unless the  authorization is terminated or revoked.  Performed at [facility], [address] [place], [place] [address]   Blood Culture (routine x 2)     Status: None   Collection Time: 06/19/2021  9:31 AM   Specimen: BLOOD  Result Value Ref Range Status   Specimen Description BLOOD BLOOD LEFT ARM  Final   Special Requests   Final    BOTTLES DRAWN AEROBIC AND ANAEROBIC Blood Culture adequate volume   Culture   Final    NO GROWTH 6 DAYS Performed at [facility], [address] [place], [place] [address]    Report Status 07/13/2021 FINAL  Final  Blood Culture (routine x 2)     Status: None   Collection Time: 06/26/2021  9:51 AM   Specimen: BLOOD  Result Value  Ref Range Status   Specimen Description BLOOD RIGHT ANTECUBITAL  Final   Special Requests   Final    BOTTLES DRAWN AEROBIC AND ANAEROBIC Blood Culture adequate volume   Culture   Final    NO GROWTH 6 DAYS Performed at [facility], [address], [place], [place] [address]    Report Status 07/13/2021 FINAL  Final  MRSA Next Gen by PCR, Nasal     Status: None   Collection Time: 06/23/2021  1:11 PM   Specimen: Nasal Mucosa; Nasal Swab  Result Value Ref Range Status   MRSA by PCR Next Gen NOT DETECTED NOT DETECTED Final    Comment: (NOTE) The GeneXpert MRSA Assay (FDA approved for NASAL specimens only), is one component of a comprehensive MRSA colonization surveillance program. It is not intended to diagnose MRSA infection nor to guide or monitor treatment for MRSA infections. Test performance is not FDA approved in patients less than 52 years old. Performed at [facility], [address], [place], [place] [address]   MRSA Next Gen by PCR, Nasal     Status: None   Collection Time: 07/15/21  4:48 PM   Specimen: Nasal Mucosa; Nasal Swab  Result Value Ref Range Status   MRSA by PCR Next Gen NOT DETECTED NOT DETECTED Final    Comment: (NOTE) The GeneXpert MRSA Assay (FDA approved for NASAL specimens only), is one component of a  comprehensive MRSA colonization surveillance program. It is not intended to diagnose MRSA infection nor to guide or monitor treatment for MRSA infections. Test performance is not FDA approved in patients less than 41 years old. Performed at [facility], [place] [address] [name], [place] [address]     Coagulation Studies: No results for input(s): LABPROT, INR in the last 72 hours.   Urinalysis: No results for input(s): COLORURINE, LABSPEC, PHURINE, GLUCOSEU, HGBUR, BILIRUBINUR, KETONESUR, PROTEINUR, UROBILINOGEN, NITRITE, LEUKOCYTESUR in the last 72 hours.  Invalid input(s): APPERANCEUR    Imaging: DG Chest Port 1 View  Result Date: 07/15/2021 CLINICAL DATA:  Shortness of breath EXAM: PORTABLE CHEST 1 VIEW COMPARISON:  07/10/2021 FINDINGS: Stable heart size. Low lung volumes. Worsening airspace opacities throughout the left lung and within the right lung base. Probable small bilateral pleural effusions. No pneumothorax. IMPRESSION: 1. Worsening airspace opacities throughout the left lung and within the right lung base. 2. Probable small bilateral pleural effusions. Electronically Signed   By: Davina Poke D.O.   On: 07/15/2021 09:41   VAS US RENAL ARTERY DUPLEX  Result Date: 07/15/2021 ABDOMINAL VISCERAL Patient Name:  Jason Holt.  Date of Exam:   07/15/2021 Medical Rec #: [id number]                Accession #:    [id number] Date of Birth: October 09, 1945                Patient Gender: M Patient Age:   40 years Exam Location:  [facility] Procedure:      VAS US RENAL ARTERY DUPLEX Referring Phys: [id number] Cotina Freedman -------------------------------------------------------------------------------- High Risk Factors: Hypertension. Other Factors: Large saddle pulmonary embolus. Limitations: Obesity, air/bowel gas and On full O2, not able to be in the proper position, he has a large abdominal hernia. Comparison Study: No priors. Performing Technologist: Oda Cogan RDMS, RVT   Examination Guidelines: A complete evaluation includes B-mode imaging, spectral Doppler, color Doppler, and power Doppler as needed of all accessible portions of each vessel. Bilateral testing is considered  an integral part of a complete examination. Limited examinations for reoccurring indications may be performed as noted.  Duplex Findings: +--------------------+--------+--------+------+--------------+  Mesenteric           PSV cm/s EDV cm/s Plaque    Comments     +--------------------+--------+--------+------+--------------+  Aorta Mid                                     Not visualized  +--------------------+--------+--------+------+--------------+  Celiac Artery Origin                          Not visualized  +--------------------+--------+--------+------+--------------+  SMA Origin                                    Not visualized  +--------------------+--------+--------+------+--------------+    +------------------+--------+--------+--------------+  Right Renal Artery PSV cm/s EDV cm/s    Comment      +------------------+--------+--------+--------------+  Origin                               Not visualized  +------------------+--------+--------+--------------+  Proximal                             Not visualized  +------------------+--------+--------+--------------+  Mid                  128       12                    +------------------+--------+--------+--------------+  Distal                90       13                    +------------------+--------+--------+--------------+  Technologist observations: Technically limited study. Left kidney not visualized. There is a large, nonvascularized, anechoic structure with mixed echo noted in the area of left kidney. +------------+--------+--------+----+-----------+--------+--------+---+  Right Kidney PSV cm/s EDV cm/s RI   Left Kidney PSV cm/s EDV cm/s RI   +------------+--------+--------+----+-----------+--------+--------+---+  Upper Pole                           Upper Pole                         +------------+--------+--------+----+-----------+--------+--------+---+  Mid          37       11       0.70 Mid                                +------------+--------+--------+----+-----------+--------+--------+---+  Lower Pole   35       9        0.75 Lower Pole                         +------------+--------+--------+----+-----------+--------+--------+---+  Hilar                               Hilar                              +------------+--------+--------+----+-----------+--------+--------+---+ +------------------+-----+------------------++  Right Kidney             Left Kidney          +------------------+-----+------------------++  RAR                      RAR                  +------------------+-----+------------------++  RAR (manual)             RAR (manual)         +------------------+-----+------------------++  Cortex                   Cortex               +------------------+-----+------------------++  Cortex thickness         Corex thickness      +------------------+-----+------------------++  Kidney length (cm) 11.71 Kidney length (cm)   +------------------+-----+------------------++  Summary: Renal:  Right: Abnormal right Resistive Index. Poorly visualized kidney with        no evidence of stenosis in the mid and distal segments of the        renal artery. The proximal segment was not visualized. Left:  Left kidney was not visualized.  *See table(s) above for measurements and observations.  Diagnosing physician: Monica Martinez MD  Electronically signed by Monica Martinez MD on 07/15/2021 at 1:55:36 PM.    Final      Medications:    sodium chloride Stopped (07/12/21 2034)   ceFEPime (MAXIPIME) IV Stopped (07/16/21 1817)   dexmedetomidine (PRECEDEX) IV infusion 0.6 mcg/kg/hr (07/17/21 0700)   heparin 1,600 Units/hr (07/17/21 0700)   lactated ringers 75 mL/hr at 07/17/21 0700    buprenorphine  1 patch Transdermal Weekly   Chlorhexidine Gluconate Cloth  6  each Topical Q0600   gabapentin  200 mg Oral BID   mouth rinse  15 mL Mouth Rinse BID   metoprolol tartrate  25 mg Oral BID   pantoprazole  40 mg Oral Daily   sodium bicarbonate  1,300 mg Oral TID   sodium chloride, acetaminophen **OR** acetaminophen, hydrALAZINE, hydrOXYzine, ipratropium-albuterol, LORazepam, ondansetron **OR** ondansetron (ZOFRAN) IV  Assessment/ Plan:  Acute kidney injury.  This appears to be temporally related to the use of intravenous contrast and hypotension.  I believe this is the etiology of his worsening renal failure.  He continues to urinate and is nonoliguric.  Renal ultrasound is pending.  Urine microscopy is consistent with Foley trauma.  I doubt this represents an acute glomerulonephritis.  We discussed dialysis.  Discussed with patient's daughter.  Renal function does appear to be slightly improved today.  Urine output picking up a little.  We will prefer to watch patient over the weekend for recovery. ANEMIA-stable at this time. MBD-mild hyperphosphatemia CPK normal range HTN/VOL-volume status slightly hypervolemic.  We will continue to follow and no IV Lasix at this point.  Blood pressures appear to be marginal.  No antihypertensives on hold Pulmonary embolus saddle embolus with hypotension hemodynamic compromise.  Continues to be hypotensive prognosis may be poor.      LOS: [place] '@TODAY''@7'$ :25 AM

## 2021-07-17 NOTE — Progress Notes (Addendum)
eLink Physician-Brief Progress Note ?Patient Name: Jason Holt. ?DOB: 21-Feb-1946 ?MRN: [id number] ? ? ?Date of Service ? 07/17/2021  ?HPI/Events of Note ? Pt became restless and pulled cortrak out.  Pt was receiving tube feeds through this. Precedex restarted by bedside.  ? ?Urology has not come to insert difficult foley.   ?eICU Interventions ? Agree with restarting precedex gtt.  ?Awaiting callback from urology.  ? ? ? ?Intervention Category ?Intermediate Interventions: Other: ? ?Elsie Lincoln ?07/17/2021, 9:19 PM ? ?9:25 PM ?Coude cath and urojet ordered for tonight.   ? ?1:43 AM ?Urology inserted foley cath earlier which returned clear urine but bedside has notified me that there are now blood clots.  Pt is on heparin gtt for PE. ? ?Plan> ?Continue heparin gtt.  ?Bedside to notify urology.  Will start continuous bladder irrigation.  ?

## 2021-07-17 NOTE — Progress Notes (Addendum)
? ?NAME:  Jason Amendola., MRN:  [id number], DOB:  12-Jan-1946, LOS: 10 ?ADMISSION DATE:  06/20/2021, CONSULTATION DATE:  2/28 ?REFERRING MD:  Wynetta Emery, CHIEF COMPLAINT:  Dyspnea  ? ?History of Present Illness:  ?76 y/o male presented to [place] on 2/28 after his family noted that his oxygenation was lower than normal at home.  He says that he didn't have any dyspnea or chest pain at the time.  He was admitted to [facility], diagnosed with pneumonia and sepsis.  He required BIPAP for several days, then has transitioned to high flow nasal cannula.  He moved to [facility] on 3/4 because his oxygenation wasn't improving and a CT angiogram was performed that showed a saddle PE.   ?He is complaining of abdominal, back and leg pain today but denies dyspnea.  He has not had a cough or mucus production.  He denies chest pain.   ? ?Pertinent  Medical History  ? ?Chronic low back pain related to a chronic lumbar disc on chronic opiate ?Hypertension ?GERD ?Chronic knee pain ? ?Significant Hospital Events: ?Including procedures, antibiotic start and stop dates in addition to other pertinent events   ?2/28 admission, treatment for pneumonia with ceftriaxone/azithro, elevated troponin, treated with BIPAP ?3/1 TTE> LVEF 55-60%, mild LVH, D shaped septum, evidence of RV overload ?3/3 found to have saddle PE on CT angio, patchy ggo bilaterally; negative lower ext doppler, started on heparin ?3/4 moved to [facility] for persistent hypoxemia, remains on HHF ?3/5 no change in oxygen needs, given full dose TPA ?3/6 weaned O2 needs, tolerating diet. ?3/7 nephro consulted d/t rising cr. They felt mixed picture of Hypotension and Contrast dye injury.  Received Lasix IV. ?3/8 still on 50% heated high flow. Renal fxn worse.  Added cefepime.  For possible hcap, MRSA PCR again negative.  Bicarbonate supplementation started ?3/9: Renal function continues to worsen.  Urine output dropping.  Increasing bicarbonate, adding back gentle IV hydration.  No  improvement in oxygen requirements ?3/10 on Precedex and BIPAP. BP boarderline hypotension, bradycardia all 2/2 dex. Increased edema on CXR. Place CVL for co-ox monitoring and CVP, creatinine hitting plateau and UOP picking up  ? ?Interim History / Subjective:  ?Sedated on dex. Appears comfortable.  ?Cr improved.  ? ?Objective   ?Blood pressure (Abnormal) 92/53, pulse (Abnormal) 59, temperature (Abnormal) 96.8 ?F (36 ?C), temperature source Axillary, resp. rate 16, height '5\' 5"'$  (1.651 m), weight 99.8 kg, SpO2 95 %. ?   ?FiO2 (%):  [40 %-60 %] 40 %  ? ?Intake/Output Summary (Last 24 hours) at 07/17/2021 0946 ?Last data filed at 07/17/2021 0800 ?Gross per 24 hour  ?Intake 2119.37 ml  ?Output 1200 ml  ?Net 919.37 ml  ? ?Filed Weights  ? 07/14/21 0500 07/14/21 1700 07/15/21 0500  ?Weight: 98 kg 99.6 kg 99.8 kg  ? ? ?Examination: ?General sedated on precedex he looks the most comfortable I've seen him in last 3 days  ?HENT NCAT BIPAP in place  ?Pulm equal bilateral chest rise. Decreased t/o. Currently on BIPAP but FIO2 needs better on BIPAP than when on high flow ?Pcxr personally reviewed. Increased bilateral airspace disease when comparing to prior film. Not sure what amt is edema vs PNA.  ?Card bradycardic  ?Ext diffuse anasarca  ?Abd soft ?Neuro sedated and tremulous ?GU increased UOP from external drainage device noted.  ? ?Resolved Hospital Problem list   ?Pneumonia ?Lactic acidosis ?Elevated trop d/t right heart strain  ?Assessment & Plan:  ?Principal Problem: ?  Acute  massive pulmonary embolism ([place]) ?Active Problems: ?  Acute respiratory failure with hypoxia ([place]) ?  Pulmonary infarct [facility]) ?  Atelectasis ?  AKI (acute kidney injury) ([place]) ?  Leukocytosis ?  Normal anion gap metabolic acidosis ?  GERD (gastroesophageal reflux disease) ?  Depression ?  Essential hypertension ?  Pressure injury of skin ?  Tremor ?  Chronic pain ?  Opioid dependence ([place]) ?  Acute metabolic encephalopathy ?  Anemia ?  Hypotension due  to drugs ? ? ? ?Acute respiratory failure with hypoxemia 2/2 Saddle PE, submassive with RV strain complicated by hcap(NOS) and now element of what looks like pulmonary edema  ?Plan ?Cont bipap w/ mandatory rest off from it for oral care every 4 hrs; using heated high flow  ?PAD protocol RASS goal 0 to -1 w/ precedex ?Oral care  ?Place CVL for CVP monitoring and scvo2, if co-ox significantly depressed he may benefit from inotropic support if his RV fxn remains reduced ?Hold lasix for now  ?Day 4 cefepime  ?Am cxr  ? ? ?Drug induced Hypotension 2/2 precedex ?We had been treating him for hypertension  ?Plan ?Dc BB ?Wean precedex ?May need pressor support to keep MAP at goal  ? ?Non-oliguric Acute Kidney Injury: Felt likely secondary to mix of low end organ perfusion and contrast mediated injury.   ?Creatinine seems to hit plateau. UOP has picked up ?Plan ?Cont gentle hydration  ?Avoid nephrotoxins  ?Avoid hypotension, may need to add low dose pressor to keep MAP elevated  ?Strict I&O ?Am chem  ?Not sure we are out of woods for HD but seem hopeful given increased UOP and creatinine leveling off  ? ?Anemia. No clear evidence of bleeding ? Dilutional?  ?Plan ?Hemoccult stool  ?Serial labs ?Cont IV heparin for now  ? ?Non anion gap metabolic acidosis  ?-acid base slowly improving.  ?Plan ?Cont bicarb supplementation but changing to IV given not safe to give PO currently  ?Cont gentle hydration  ? ?New metabolic encephalopathy superimposed on chronic pain from prior trauma, ruptured disc ?Prob slow drug clearance and worsening BUN contributing to delirium, started on precedex 3/9 ?Plan ?Cont Butrans patch  ?Reduced his gabapentin 3/9 ->can stop as precedex should help w/ possible concern for wd  ? ?Deconditioning ?Plan ?He will need extensive PT/OT ?Oxygen requirements are to high for CIR at this point  ? ? ?Leukocytosis  ?-improving ?Plan ?Cont to trend cbc and fever curve  ? ? ?Best Practice (right click and "Reselect all  SmartList Selections" daily)  ? ?Diet/type: Regular consistency (see orders) and NPO ?DVT prophylaxis: systemic heparin ?GI prophylaxis: N/A ?Lines: N/A ?Foley:  N/A ?Code Status:  full code ?Last date of multidisciplinary goals of care discussion [several were held on 3/4.  See documentation] ?As of 3/10 remains critically ill w/ what seems like poor prognosis at best. He is debilitated, oxygen requirements still significant. I am concerned that he may improve some but not have the reserves to recover ? ? ?Critical care time: 40 min  ?  ?Erick Colace ACNP-BC ?[place] ?Pager # [phone] OR # [phone] if no answer ? ? ? ? ? ?

## 2021-07-17 NOTE — Progress Notes (Signed)
PT Cancellation Note ? ?Patient Details ?Name: Jason Holt. ?MRN: [id number] ?DOB: October 14, 1945 ? ? ?Cancelled Treatment:    Reason Eval/Treat Not Completed: Medical issues which prohibited therapy; patient significantly hypotensive and starting on pressors.  Will attempt another day. ? ? ?Reginia Naas ?07/17/2021, 3:17 PM ?Magda Kiel, PT ?Acute Rehabilitation Services ?KSKSH:[phone] ?[phone] ?07/17/2021 ? ?

## 2021-07-17 NOTE — Progress Notes (Signed)
RT placed patient on BIPAP at this time. Pt is tolerating settings well. RT will continue to monitor. ?

## 2021-07-17 NOTE — Progress Notes (Signed)
Pt pulled off [place]ak, CCM RN made aware, also awaiting nephrology to insert the coude cath, pt wet the bed once so far ?

## 2021-07-17 NOTE — Progress Notes (Signed)
Initial Nutrition Assessment ? ?DOCUMENTATION CODES:  ? ?Obesity unspecified ? ?INTERVENTION:  ?Initiate TF via Cortrak NGT with Vital 1.5 cal formula at goal rate of 55 ml/h (1320 ml per day) ? ?Prosource TF 45 ml BID per tube ? ?Tube feeding to provide 2060 kcals, 111 gm protein, 1003 ml free water daily. ? ?NUTRITION DIAGNOSIS:  ? ?Inadequate oral intake related to inability to eat as evidenced by NPO status. ? ?GOAL:  ? ?Patient will meet greater than or equal to 90% of their needs ? ?MONITOR:  ? ?TF tolerance, Labs, Skin, I & O's, Weight trends ? ?REASON FOR ASSESSMENT:  ? ?Consult (Cortrak NGT placement) ?  ? ?ASSESSMENT:  ? ?76 year old male with PMH of hypertension and GERD admitted for respiratory failure initially treated for pneumonia and then found to have bilateral pulmonary emboli. He was given TPA on 3/5 with subjective improvement in his breathing but he remains on HFNC/BiPAP. ? ?Cortrak NGT placed today. Tube of tip at the antral/pyloric region per abd x-ray. Pt is currently on BiPAP. Pt made NPO today. Meal completion 100% prior to NPO status. Daughter at bedside reports pt was eating well prior to admission with no difficulties. Weight up due to fluid status. RD given verbal consent from MD to initiate enteral/tube feeds via Cortrak NGT. RD to order tube feeding orders. Per palliative, pt limited code status and family ok with time limited trial of mechanical ventilation. Per Nephrologist, renal function slightly improved today.  ? ?NUTRITION - FOCUSED PHYSICAL EXAM: ? ?Flowsheet Row Most Recent Value  ?Orbital Region No depletion  ?Upper Arm Region No depletion  ?Thoracic and Lumbar Region No depletion  ?Buccal Region No depletion  ?Temple Region No depletion  ?Clavicle Bone Region No depletion  ?Clavicle and Acromion Bone Region No depletion  ?Scapular Bone Region Unable to assess  ?Dorsal Hand Unable to assess  ?Patellar Region No depletion  ?Anterior Thigh Region No depletion  ?Posterior Calf  Region No depletion  ?Edema (RD Assessment) Moderate  ?Hair Reviewed  ?Eyes Reviewed  ?Mouth Unable to assess  ?Skin Reviewed  ?Nails Unable to assess  ? ?  ? ?Labs and medications reviewed.  ? ?Diet Order:   ?Diet Order   ? ?       ?  Diet NPO time specified Except for: Sips with Meds  Diet effective now       ?  ? ?  ?  ? ?  ? ? ?EDUCATION NEEDS:  ? ?Not appropriate for education at this time ? ?Skin:  Skin Assessment: Skin Integrity Issues: ?Skin Integrity Issues:: Stage II ?Stage II: buttocks ? ?Last BM:  3/8 ? ?Height:  ? ?Ht Readings from Last 1 Encounters:  ?06/11/2021 '5\' 5"'$  (1.651 m)  ? ? ?Weight:  ? ?Wt Readings from Last 1 Encounters:  ?07/15/21 99.8 kg  ?Admit weight (2/28) 94.2 kg ?I/O's since admit +3.2 L ? ?BMI:  Body mass index is 36.61 kg/m?. ? ?Estimated Nutritional Needs:  ? ?Kcal:  2000-2200 ? ?Protein:  110-120 grams ? ?Fluid:  >/= 2 L/day ? ?Corrin Parker, MS, RD, LDN ?RD pager number/after hours weekend pager number on Amion. ? ?

## 2021-07-17 NOTE — Progress Notes (Signed)
Pt w/ persistent renal failure  ?On [place] it was initially reported as large amt of urine retained in urinary bladder. On formal read it is reported as "A 4.3 x 2.6 x 3.5 cm hypoechoic lesion along the urinary bladder". Foley cath  ?Attempts x 2 from nursing staff and even medical director of [facility] w/ no success  ?Plan ?Will ask Urology to place foley ?But also given findings will ask about direct visualization as he had TPA and certainly could be at risk for clot formation leading to obstruction  ? ?Erick Colace ACNP-BC ?[place] ?Pager # [phone] OR # [phone] if no answer ?  ?

## 2021-07-17 NOTE — Progress Notes (Signed)
ANTICOAGULATION CONSULT NOTE ? ?Pharmacy Consult for heparin ?Indication: pulmonary embolus ? ?Allergies  ?Allergen Reactions  ? Influenza Virus Vacc Split Pf Hives  ? Dye Fdc Red [Red Dye]   ? ? ?Patient Measurements: ?Height: '5\' 5"'$  (165.1 cm) ?Weight: 99.8 kg (220 lb 0.3 oz) ?IBW/kg (Calculated) : 61.5 ?HEPARIN DW (KG): 82.1  ? ?Labs: ?Recent Labs  ?  07/14/21 ?1300 07/14/21 ?1300 07/15/21 ?7322 07/16/21 ?0254 07/16/21 ?2706 07/16/21 ?1159 07/16/21 ?1621 07/17/21 ?0527  ?HGB  --    < > 8.9*  --  9.4* 8.5*  --  7.8*  ?HCT  --    < > 28.2*  --  30.3* 25.0*  --  24.6*  ?PLT  --   --  216  --  224  --   --  216  ?HEPARINUNFRC 0.47  --  0.39 <0.10* 0.49  --   --  0.25*  ?CREATININE  --    < > 4.08*  --  4.90*  --  5.14* 5.01*  ?CKTOTAL 51  --   --   --   --   --   --   --   ? < > = values in this interval not displayed.  ? ? ? ?Estimated Creatinine Clearance: 13.4 mL/min (A) (by C-G formula based on SCr of 5.01 mg/dL (H)). ? ?Assessment: ?76 year old male admitted for shortness of breath. CT Positive for acute PE with evidence of right heart strain (RV/LV Ratio = 1.3) consistent with at least submassive PE. Patient transferred from [facility] to [facility] for possible IR intervention vs. thrombolytic. Patient is s/p systemic tPA and heparin was resumed.  ? ?Heparin level 0.25 units/mL (on 1600 units/hr) ?H/H 7.8/24.6, plt 216 ? ?Goal of Therapy:  ?Heparin level 0.3-0.7 units/mL ?Monitor platelets by anticoagulation protocol: Yes ?  ?Plan:  ?Increase  IV heparin to 1700 units/hr ?Heparin level in ~8hr ?Daily CBC and heparin level while on heparin ?Monitor  ?F/u ability to transition to [place] when renal function stable ? ? ?Thank you for allowing pharmacy to be a part of this patient?s care. ? ?Donnald Garre, PharmD ?Clinical Pharmacist ? ?Please check AMION for all [place] numbers ?After 10:00 PM, call [place] [phone] ? ? ? ?

## 2021-07-17 NOTE — Procedures (Signed)
Cortrak ? ?Person Inserting Tube:  Marveen Reeks, RD ?Tube Type:  Cortrak - 43 inches ?Tube Size:  10 ?Tube Location:  Right nare ?Initial Placement:  Stomach ?Secured by: Dorann Lodge ?Technique Used to Measure Tube Placement:  Marking at nare/corner of mouth ?Cortrak Secured At:  65 cm ? ?Cortrak Tube Team Note: ? ?Consult received to place a Cortrak feeding tube.  ? ?X-ray is required, abdominal x-ray has been ordered by the Cortrak team. Please confirm tube placement before using the Cortrak tube.  ? ?If the tube becomes dislodged please keep the tube and contact the Cortrak team at [email or web address] (password [email or web address]) for replacement.  ?If after hours and replacement cannot be delayed, place a NG tube and confirm placement with an abdominal x-ray.  ? ? ?Hermina Barters RD, LDN ?Clinical Dietitian ?See AMiON for contact information.  ? ? ?

## 2021-07-17 NOTE — Progress Notes (Signed)
eLink Physician-Brief Progress Note ?Patient Name: Jason Holt. ?DOB: 1946/02/14 ?MRN: [id number] ? ? ?Date of Service ? 07/17/2021  ?HPI/Events of Note ? Patient has had agitation and has been pulling off his BIPAP mask. When the mask if pulled off, he desaturates. He is getting ativan every 8 hours . Seen on camera and is agitated. HR in 80s. Sbp 130s. RN asking for additional ativan.   ?eICU Interventions ? Wills tart a precedex infusion. Hopefully will be able to avoid intubation.   ? ? ? ?Intervention Category ?Major Interventions: Respiratory failure - evaluation and management ? ?Avry Monteleone G Chabely Norby ?07/17/2021, 3:26 AM ?

## 2021-07-17 NOTE — Progress Notes (Signed)
Occupational Therapy Treatment ?Patient Details ?Name: Jason Holt. ?MRN: [id number] ?DOB: 07/29/1945 ?Today's Date: 07/17/2021 ? ? ?History of present illness 76 year old gentleman who presented to [facility] 2/28 with pneumonia and sepsis; transfered to [facility] 3/4 for intervention of a large saddle pulmonary embolus s/p TPA on 07/12/21.  Had AKI also due to hypotension and contrast.  PMHx: Acid reflux, Chronic knee pain, Depression, Hypertension, Ruptured lumbar disc ?  ?OT comments ? Pt on 30L;60%FiO2. Pt seen for limited session with nsg in room. Unable to progress to EOB; significant functional decline as compared to eval 2 days ago. Pt appears delirious. Will follow up next week as appropriate.   ? ?Recommendations for follow up therapy are one component of a multi-disciplinary discharge planning process, led by the attending physician.  Recommendations may be updated based on patient status, additional functional criteria and insurance authorization. ?   ?Follow Up Recommendations ? OT at Long-term acute care hospital  ?  ?Assistance Recommended at Discharge Frequent or constant Supervision/Assistance  ?Patient can return home with the following ? Two people to help with walking and/or transfers;Two people to help with bathing/dressing/bathroom;Assistance with feeding;Direct supervision/assist for medications management;Direct supervision/assist for financial management;Assist for transportation;Help with stairs or ramp for entrance ?  ?Equipment Recommendations ? BSC/3in1;Wheelchair (measurements OT);Wheelchair cushion (measurements OT)  ?  ?Recommendations for Other Services   ? ?  ?Precautions / Restrictions Precautions ?Precautions: Fall ?Precaution Comments: HHFNC 60% FiO2; 30L  ? ? ?  ? ?Mobility Bed Mobility ?  ?  ?  ?  ?  ?  ?  ?General bed mobility comments: Attempted to progress to edge of bed however pt unable to tolerate ?  ? ?Transfers ?  ?  ?  ?  ?  ?  ?  ?  ?  ?General transfer  comment: NT today ?  ?  ?Balance   ?  ?  ?  ?  ?  ?  ?  ?  ?  ?  ?  ?  ?  ?  ?  ?  ?  ?  ?   ? ?ADL either performed or assessed with clinical judgement  ? ?ADL   ?Eating/Feeding: Moderate assistance ?  ?Grooming: Moderate assistance ?  ?  ?  ?  ?  ?  ?  ?  ?  ?  ?  ?  ?  ?  ?  ?  ?General ADL Comments: total A with other ADL tasks ?  ? ?Extremity/Trunk Assessment Upper Extremity Assessment ?Upper Extremity Assessment: Generalized weakness (AROM overall WFL however pt unable to hold against gravity for any length of time) ?  ?Lower Extremity Assessment ?Lower Extremity Assessment: Defer to PT evaluation (increased BLE edema) ?  ?  ?  ? ?Vision   ?Additional Comments: wears glasses ?  ?Perception   ?  ?Praxis   ?  ? ?Cognition Arousal/Alertness: Lethargic ?Behavior During Therapy: Restless, Anxious ?Overall Cognitive Status: Impaired/Different from baseline ?  ?  ?  ?  ?  ?  ?  ?  ?  ?  ?  ?  ?  ?  ?  ?  ?General Comments: Appears delirious; on phone with his cousin stating he doesn't know where he is; thinks he was in a car accident ?  ?  ?   ?Exercises General Exercises - Upper Extremity ?Shoulder Flexion: AAROM, 10 reps, Seated ?General Exercises - Lower Extremity ?Ankle Circles/Pumps: AROM, Both, 10 reps, Seated ?Heel Slides: Both,  10 reps, Supine ? ?  ?Shoulder Instructions   ? ? ?  ?General Comments    ? ? ?Pertinent Vitals/ Pain       Pain Assessment ?Pain Assessment: Faces ?Faces Pain Scale: Hurts little more ?Pain Location: generalized ?Pain Descriptors / Indicators: Discomfort ?Pain Intervention(s): Limited activity within patient's tolerance ? ?Home Living   ?  ?  ?  ?  ?  ?  ?  ?  ?  ?  ?  ?  ?  ?  ?  ?  ?  ?  ? ?  ?Prior Functioning/Environment    ?  ?  ?  ?   ? ?Frequency ? Min 2X/week  ? ? ? ? ?  ?Progress Toward Goals ? ?OT Goals(current goals can now be found in the care plan section) ? Progress towards OT goals: Not progressing toward goals - comment (decline in status) ? ?Acute Rehab OT  Goals ?Patient Stated Goal: unable to state ?OT Goal Formulation: Patient unable to participate in goal setting ?Time For Goal Achievement: 07/29/21 ?Potential to Achieve Goals: Fair ?ADL Goals ?Pt Will Perform Grooming: with modified independence;sitting ?Pt Will Perform Upper Body Bathing: with set-up;sitting ?Pt Will Perform Lower Body Bathing: with min guard assist;sit to/from stand ?Pt Will Transfer to Toilet: with min assist;bedside commode ?Additional ADL Goal #1: Pt will maintain SpO2 above 90 during ADL and mobility using compensatory and pursed lip breathing techniques  ?Plan Discharge plan needs to be updated   ? ?Co-evaluation ? ? ?   ?  ?  ?  ?  ? ?  ?AM-PAC OT "6 Clicks" Daily Activity     ?Outcome Measure ? ? Help from another person eating meals?: A Lot ?Help from another person taking care of personal grooming?: A Lot ?Help from another person toileting, which includes using toliet, bedpan, or urinal?: Total ?Help from another person bathing (including washing, rinsing, drying)?: A Lot ?Help from another person to put on and taking off regular upper body clothing?: Total ?Help from another person to put on and taking off regular lower body clothing?: Total ?6 Click Score: 9 ? ?  ?End of Session Equipment Utilized During Treatment: Oxygen ? ?OT Visit Diagnosis: Unsteadiness on feet (R26.81);Other abnormalities of gait and mobility (R26.89);Muscle weakness (generalized) (M62.81);Other symptoms and signs involving cognitive function;Dizziness and giddiness (R42);Pain ?Pain - part of body: Leg;Ankle and joints of foot ?  ?Activity Tolerance Patient limited by lethargy;Patient limited by fatigue ?  ?Patient Left in bed;with call bell/phone within reach;with nursing/sitter in room ?  ?Nurse Communication Mobility status ?  ? ?   ? ?Time: 1350-1406 ?OT Time Calculation (min): 16 min ? ?Charges: OT General Charges ?$OT Visit: 1 Visit ?OT Treatments ?$Self Care/Home Management : 8-22 mins ? ?[facility],  OT/L  ? ?Acute OT Clinical Specialist ?Acute Rehabilitation Services ?Pager [phone] ?Office [phone]  ? ?Ivette Castronova,HILLARY ?07/17/2021, 5:00 PM ?

## 2021-07-17 NOTE — Progress Notes (Signed)
ANTICOAGULATION CONSULT NOTE ? ?Pharmacy Consult for heparin ?Indication: pulmonary embolus ? ?Allergies  ?Allergen Reactions  ? Influenza Virus Vacc Split Pf Hives  ? Dye Fdc Red [Red Dye]   ? ? ?Patient Measurements: ?Height: '5\' 5"'$  (165.1 cm) ?Weight: 99.8 kg (220 lb 0.3 oz) ?IBW/kg (Calculated) : 61.5 ?HEPARIN DW (KG): 82.1  ? ?Labs: ?Recent Labs  ?  07/16/21 ?0446 07/16/21 ?1159 07/16/21 ?1621 07/17/21 ?2423 07/17/21 ?1613  ?HGB 9.4* 8.5*  --  7.8* 8.4*  ?HCT 30.3* 25.0*  --  24.6* 26.5*  ?PLT 224  --   --  216 263  ?HEPARINUNFRC 0.49  --   --  0.25* 0.32  ?CREATININE 4.90*  --  5.14* 5.01* 4.93*  ? ? ? ?Estimated Creatinine Clearance: 13.6 mL/min (A) (by C-G formula based on SCr of 4.93 mg/dL (H)). ? ?Assessment: ?76 year old male admitted for shortness of breath. CT Positive for acute PE with evidence of right heart strain (RV/LV Ratio = 1.3) consistent with at least submassive PE. Patient transferred from [facility] to [facility] for possible IR intervention vs. thrombolytic. Patient is s/p systemic tPA and heparin was resumed.  ?-heparin level on the low end of goal (0.32) at 1700 units/hr ?-hg= 8.4 ? ?Goal of Therapy:  ?Heparin level 0.3-0.7 units/mL ?Monitor platelets by anticoagulation protocol: Yes ?  ?Plan:  ?Increase heparin to 1750 units/hr ?Daily CBC and heparin level while on heparin ? ?Hildred Laser, PharmD ?Clinical Pharmacist ?**Pharmacist phone directory can now be found on [email or web address] (PW [email or web address]).  Listed under [place]. ? ? ? ? ?

## 2021-07-17 NOTE — Progress Notes (Signed)
Daily Progress Note   Patient Name: Jason Holt.       Date: 07/17/2021 DOB: 09-13-1945  Age: 76 y.o. MRN#: [id number] Attending Physician: Freddi Starr, MD Primary Care Physician: [facility] Admit Date: 06/19/2021  Reason for Consultation/Follow-up: Establishing goals of care  Subjective: Patient minimally interactive, appears comfortable on Precedex infusion  Length of Stay: 10  Current Medications: Scheduled Meds:   buprenorphine  1 patch Transdermal Weekly   Chlorhexidine Gluconate Cloth  6 each Topical Q0600   mouth rinse  15 mL Mouth Rinse BID   pantoprazole (PROTONIX) IV  40 mg Intravenous Q12H    Continuous Infusions:  sodium chloride Stopped (07/12/21 2034)   ceFEPime (MAXIPIME) IV Stopped (07/16/21 1817)   dexmedetomidine (PRECEDEX) IV infusion Stopped (07/17/21 1355)   heparin 1,700 Units/hr (07/17/21 1400)   norepinephrine (LEVOPHED) Adult infusion 2 mcg/min (07/17/21 1418)   sodium bicarbonate 150 mEq in D5W infusion 50 mL/hr at 07/17/21 1400    PRN Meds: sodium chloride, acetaminophen **OR** acetaminophen, hydrALAZINE, ipratropium-albuterol, [DISCONTINUED] ondansetron **OR** ondansetron (ZOFRAN) IV  Physical Exam Constitutional:      General: He is not in acute distress.    Appearance: He is ill-appearing.     Comments: Lethargic  Pulmonary:     Effort: Pulmonary effort is normal.  Skin:    General: Skin is warm and dry.  Neurological:     Mental Status: He is disoriented.            Vital Signs: BP (!) 141/87    Pulse 71    Temp (!) 97.5 F (36.4 C) (Axillary)    Resp (!) 24    Ht '5\' 5"'$  (1.651 m)    Wt 99.8 kg    SpO2 (!) 87%    BMI 36.61 kg/m  SpO2: SpO2: (!) 87 % O2 Device: O2 Device: High Flow Nasal Cannula O2 Flow Rate: O2 Flow  Rate (L/min): 30 L/min  Intake/output summary:  Intake/Output Summary (Last 24 hours) at 07/17/2021 1433 Last data filed at 07/17/2021 1400 Gross per 24 hour  Intake 2297.93 ml  Output 1200 ml  Net 1097.93 ml   LBM: Last BM Date : 07/15/21 Baseline Weight: Weight: 100.7 kg Most recent weight: Weight: 99.8 kg       Palliative Assessment/Data: PPS 20%      Patient Active Problem List   Diagnosis Date Noted   Anemia 07/17/2021   Hypotension due to drugs 07/17/2021   Acute kidney failure ([place])    Acute metabolic encephalopathy 47/01/6282   Pulmonary infarct ([facility]) 07/15/2021   Atelectasis 07/15/2021   Normal anion gap metabolic acidosis 66/29/4765   Tremor 07/15/2021   Chronic pain 07/15/2021   Opioid dependence ([place]) 07/15/2021   Acute pulmonary embolism ([place]) 07/10/2021   Leukocytosis 07/09/2021   Acute hypoxemic respiratory failure ([place]) 06/19/2021   AKI (acute kidney injury) ([place]) 06/17/2021   Pressure injury of skin 06/13/2021   AMS (altered mental status) 06/08/2019   Depression 06/08/2019   Essential hypertension 06/08/2019   GERD (gastroesophageal reflux disease) 08/15/2013    Palliative Care Assessment & Plan   HPI: 76 y.o. male  with past medical history of chronic low back pain,  hypertension, GERD, and chronic knee pain admitted on 06/18/2021 for low oxygen.  He was initially diagnosed with pneumonia and sepsis and also required BiPAP for several days.  He was transferred to [facility] March 4 and found to have a saddle PE.  On March 7 he was found to have rising creatinine and kidney function has continued to worsen.  On March 9 his mental status became more altered.  PMT consulted to discuss goals of care.  Assessment: Discussion with Marni Griffon, NP about patient's status and plan of care moving forward.  Discussion with bedside RN as well.  I have reviewed medical records including EPIC notes, labs, and imaging.  Family meeting to include patient's 2  daughters Anderson Malta and [name] as well as grandson Merrilee Seashore.  Merrilee Seashore works in the [employer] and was able to assist in conversation.  I introduced Palliative Medicine as specialized medical care for people living with serious illness. It focuses on providing relief from the symptoms and stress of a serious illness. The goal is to improve quality of life for both the patient and the family.  We discussed a brief life review of the patient.  Family shares patient was living at home alone daughters were serving as caregivers.  Patient was ambulatory at baseline and able to independently care for himself.  His daughters assisted him with bills and meal preparation.  They tell me over the past 4 weeks he experienced a decline in function as well as appetite.   We discussed patient's current illness and what it means in the larger context of patient's on-going co-morbidities.  Natural disease trajectory was discussed.  We discussed patient's PE with heart strain as well as his pneumonia and pulmonary edema.  We discussed his acute kidney injury and potential need for dialysis.  We discussed his delirium and need for Precedex.  We reviewed patient's need for central line placement.  We discussed difficulties with central line placement and potential problems if patient were to need dialysis catheter.  We discussed overall that he is frail and deconditioned.  We discussed difficulties of recovering from a critical illness.  I attempted to elicit values and goals of care important to the patient.  Family shares that patient really does not want to go to a nursing facility.  We discussed that if patient does survive current situation he will most certainly need some sort of rehab stay following hospitalization.  We discussed patient's initial statement that he would not want dialysis.  Family reviews that they do not think he understood what he was saying.  They do not feel that he understood that if his kidneys were  failing and he did not undergo dialysis this would lead to death.  We discussed potential need for ventilator if respiratory status were to worsen.  Family is hopeful to avoid this however they do share if it came to this they would want a time-limited trial of mechanical ventilation.  We discussed that this would be time-limited and they would not want to continue artificial support if patient were not improving.  Encouraged patient/family to consider DNR status understanding evidenced based poor outcomes in similar hospitalized patients, as the cause of the arrest is likely associated with chronic/terminal disease rather than a reversible acute cardio-pulmonary event.  We discussed concerns about patient's quality of life if he were to survive a resuscitation.  Family all agree they do not want patient to undergo CPR if he were to suffer cardiac arrest.  They  would rather him have a natural death.  Discussed with family the importance of continued conversation with family and the medical providers regarding overall plan of care and treatment options, ensuring decisions are within the context of the patients values and GOCs.    Questions and concerns were addressed. The family was encouraged to call with questions or concerns.  Recommendations/Plan: We will change CODE STATUS to limited-no CPR but family okay with time-limited trial of mechanical ventilation Family is open to further conversation about dialysis if it is needed-seems they would want to pursue it however they do understand there are concerns about dialysis catheter access and how patient would tolerate dialysis Continue current care over the weekend, PMT to check back in early next week -family has our contact information  Code Status: Limited code-no CPR  Care plan was discussed with Marni Griffon, NP, [place], patient's 2 daughters and grandson, Dr. Erin Fulling  Thank you for allowing the Palliative Medicine Team to assist in the  care of this patient.   Total Time 80 minutes Prolonged Time Billed  yes       Greater than 50%  of this time was spent counseling and coordinating care related to the above assessment and plan.  *Please note that this is a verbal dictation therefore any spelling or grammatical errors are due to the "[employer]" system interpretation.  Juel Burrow, DNP, [facility] Palliative Medicine Team Team Phone # [phone]  Pager [phone]

## 2021-07-17 NOTE — Consult Note (Signed)
Urology Consult   Physician requesting consult: Elsie Lincoln, MD  Reason for consult: Difficult foley  History of Present Illness: Jason Holt. is a 76 y.o. admitted to the ICU with respiratory failure due to bilateral pulmonary emboli.  He was given tPA on 3/5 and remains on-nasal cannula.  He currently has mittens in place.  He is unable to interact meaningfully with me to answer any questions.  He remains critically ill with acute hypoxemic respiratory failure.  Attempts at Foley catheter placement were futile given edema of his foreskin.  Urology was consulted to place Foley catheter.  I reviewed his chart.  Renal ultrasound 07/17/2021 incidentally revealed a 4 cm hypoechoic lesion along the urinary bladder lumen.  There is also a possible 3 cm right renal lesion concerning a possible solid mass.  He also has a likely cystic 3 cm right renal lesion along the inferior pole.  I reviewed prior imaging and CT abdomen pelvis in 08/2016 also revealed a rounded polypoid soft tissue mass protruding into the bladder lumen and at that time read demonstrated cannot exclude bladder cancer.    I see no prior urology notes in the chart and I do not know if you has ever had this evaluated cystoscopy.  Nursing states that he has been incontinent a few episodes with clear yellow urine.  He has had no gross hematuria.  Past Medical History:  Diagnosis Date   Acid reflux    Chronic knee pain    Depression    Elevated troponin 06/30/2021   Hypertension    Ruptured lumbar disc     Past Surgical History:  Procedure Laterality Date   KNEE SURGERY     LAPAROTOMY  02/18/2011   Procedure: EXPLORATORY LAPAROTOMY;  Surgeon: Jamesetta So;  Location: [facility];  Service: General;  Laterality: N/A;  [facility] Medications:  Home Meds:  No current facility-administered medications on file prior to encounter.   Current Outpatient Medications on File Prior to Encounter   Medication Sig Dispense Refill   buprenorphine (BUTRANS) 5 MCG/HR PTWK Place 1 patch onto the skin once a week.     gabapentin (NEURONTIN) 300 MG capsule Take 1 capsule (300 mg total) by mouth 3 (three) times daily. 90 capsule 1   lisinopril (PRINIVIL,ZESTRIL) 5 MG tablet Take 5 mg daily by mouth.     pantoprazole (PROTONIX) 40 MG tablet Take 40 mg by mouth daily.     docusate sodium (COLACE) 100 MG capsule Take 1 capsule (100 mg total) by mouth every 12 (twelve) hours. (Patient taking differently: Take 100 mg by mouth 2 (two) times daily as needed for mild constipation or moderate constipation. ) 60 capsule 0     Scheduled Meds:  buprenorphine  1 patch Transdermal Weekly   Chlorhexidine Gluconate Cloth  6 each Topical Q0600   feeding supplement (PROSource TF)  45 mL Per Tube BID   lidocaine  1 application. Urethral Once   mouth rinse  15 mL Mouth Rinse BID   pantoprazole (PROTONIX) IV  40 mg Intravenous Q12H   Continuous Infusions:  sodium chloride Stopped (07/12/21 2034)   ceFEPime (MAXIPIME) IV Stopped (07/17/21 1716)   dexmedetomidine (PRECEDEX) IV infusion 0.4 mcg/kg/hr (07/17/21 2000)   feeding supplement (VITAL 1.5 CAL) 55 mL/hr at 07/17/21 1900   heparin 1,750 Units/hr (07/17/21 2000)   norepinephrine (LEVOPHED) Adult infusion Stopped (07/17/21 1948)   sodium bicarbonate 150 mEq in D5W infusion 50 mL/hr at 07/17/21 2000  PRN Meds:.sodium chloride, acetaminophen **OR** acetaminophen, hydrALAZINE, ipratropium-albuterol, [DISCONTINUED] ondansetron **OR** ondansetron (ZOFRAN) IV  Allergies:  Allergies  Allergen Reactions   Influenza Virus Vacc Split Pf Hives   Dye Fdc Red [Red Dye]     History reviewed. No pertinent family history.  Social History:  reports that he has never smoked. He has never used smokeless tobacco. He reports that he does not drink alcohol and does not use drugs.  ROS: A complete review of systems was performed.  All systems are negative except for  pertinent findings as noted.  Physical Exam:  Vital signs in last 24 hours: Temp:  [96.8 F (36 C)-98.2 F (36.8 C)] 98 F (36.7 C) (03/10 2000) Pulse Rate:  [57-92] 66 (03/10 2145) Resp:  [0-34] 14 (03/10 2145) BP: (66-170)/(46-133) 103/55 (03/10 2145) SpO2:  [85 %-99 %] 98 % (03/10 2145) FiO2 (%):  [40 %-60 %] 60 % (03/10 1934) Constitutional:  On HFNC, wearing mittens, not conversive Cardiovascular: Regular rate and rhythm Respiratory: Normal respiratory effort, Lungs clear bilaterally GI: Abdomen is soft, obese, non-tender  Laboratory Data:  Recent Labs    07/15/21 0554 07/16/21 0446 07/16/21 1159 07/17/21 0527 07/17/21 1613  WBC 14.0* 14.4*  --  12.5* 15.0*  HGB 8.9* 9.4* 8.5* 7.8* 8.4*  HCT 28.2* 30.3* 25.0* 24.6* 26.5*  PLT 216 224  --  216 263    Recent Labs    07/15/21 0554 07/16/21 0446 07/16/21 1159 07/16/21 1621 07/17/21 0527 07/17/21 1613  NA 138 141 141 142 142 143  K 4.0 4.6 4.6 4.6 4.6 4.3  CL 108 110  --  111 111 110  GLUCOSE 112* 95  --  91 101* 97  BUN 53* 60*  --  62* 65* 68*  CALCIUM 7.4* 8.0*  --  8.0* 8.0* 8.1*  CREATININE 4.08* 4.90*  --  5.14* 5.01* 4.93*     Results for orders placed or performed during the hospital encounter of 06/22/2021 (from the past 24 hour(s))  Heparin level (unfractionated)     Status: Abnormal   Collection Time: 07/17/21  5:27 AM  Result Value Ref Range   Heparin Unfractionated 0.25 (L) 0.30 - 0.70 IU/mL  CBC     Status: Abnormal   Collection Time: 07/17/21  5:27 AM  Result Value Ref Range   WBC 12.5 (H) 4.0 - 10.5 K/uL   RBC 2.81 (L) 4.22 - 5.81 MIL/uL   Hemoglobin 7.8 (L) 13.0 - 17.0 g/dL   HCT 24.6 (L) 39.0 - 52.0 %   MCV 87.5 80.0 - 100.0 fL   MCH 27.8 26.0 - 34.0 pg   MCHC 31.7 30.0 - 36.0 g/dL   RDW 15.6 (H) 11.5 - 15.5 %   Platelets 216 150 - 400 K/uL   nRBC 0.0 0.0 - 0.2 %  Basic metabolic panel     Status: Abnormal   Collection Time: 07/17/21  5:27 AM  Result Value Ref Range   Sodium 142  135 - 145 mmol/L   Potassium 4.6 3.5 - 5.1 mmol/L   Chloride 111 98 - 111 mmol/L   CO2 18 (L) 22 - 32 mmol/L   Glucose, Bld 101 (H) 70 - 99 mg/dL   BUN 65 (H) 8 - 23 mg/dL   Creatinine, Ser 5.01 (H) 0.61 - 1.24 mg/dL   Calcium 8.0 (L) 8.9 - 10.3 mg/dL   GFR, Estimated 11 (L) >60 mL/min   Anion gap 13 5 - 15  .Cooxemetry Panel (carboxy, met, total hgb, O2  sat)     Status: Abnormal   Collection Time: 07/17/21 12:57 PM  Result Value Ref Range   Total hemoglobin 8.2 (L) 12.0 - 16.0 g/dL   O2 Saturation 78.3 %   Carboxyhemoglobin 2.2 (H) 0.5 - 1.5 %   Methemoglobin <0.7 0.0 - 1.5 %  Basic metabolic panel     Status: Abnormal   Collection Time: 07/17/21  4:13 PM  Result Value Ref Range   Sodium 143 135 - 145 mmol/L   Potassium 4.3 3.5 - 5.1 mmol/L   Chloride 110 98 - 111 mmol/L   CO2 18 (L) 22 - 32 mmol/L   Glucose, Bld 97 70 - 99 mg/dL   BUN 68 (H) 8 - 23 mg/dL   Creatinine, Ser 4.93 (H) 0.61 - 1.24 mg/dL   Calcium 8.1 (L) 8.9 - 10.3 mg/dL   GFR, Estimated 11 (L) >60 mL/min   Anion gap 15 5 - 15  Heparin level (unfractionated)     Status: None   Collection Time: 07/17/21  4:13 PM  Result Value Ref Range   Heparin Unfractionated 0.32 0.30 - 0.70 IU/mL  CBC     Status: Abnormal   Collection Time: 07/17/21  4:13 PM  Result Value Ref Range   WBC 15.0 (H) 4.0 - 10.5 K/uL   RBC 3.04 (L) 4.22 - 5.81 MIL/uL   Hemoglobin 8.4 (L) 13.0 - 17.0 g/dL   HCT 26.5 (L) 39.0 - 52.0 %   MCV 87.2 80.0 - 100.0 fL   MCH 27.6 26.0 - 34.0 pg   MCHC 31.7 30.0 - 36.0 g/dL   RDW 15.8 (H) 11.5 - 15.5 %   Platelets 263 150 - 400 K/uL   nRBC 0.0 0.0 - 0.2 %  Magnesium     Status: None   Collection Time: 07/17/21  4:13 PM  Result Value Ref Range   Magnesium 2.1 1.7 - 2.4 mg/dL  Phosphorus     Status: Abnormal   Collection Time: 07/17/21  4:13 PM  Result Value Ref Range   Phosphorus 8.1 (H) 2.5 - 4.6 mg/dL  Glucose, capillary     Status: Abnormal   Collection Time: 07/17/21  7:32 PM  Result Value  Ref Range   Glucose-Capillary 117 (H) 70 - 99 mg/dL   Recent Results (from the past 240 hour(s))  MRSA Next Gen by PCR, Nasal     Status: None   Collection Time: 07/15/21  4:48 PM   Specimen: Nasal Mucosa; Nasal Swab  Result Value Ref Range Status   MRSA by PCR Next Gen NOT DETECTED NOT DETECTED Final    Comment: (NOTE) The GeneXpert MRSA Assay (FDA approved for NASAL specimens only), is one component of a comprehensive MRSA colonization surveillance program. It is not intended to diagnose MRSA infection nor to guide or monitor treatment for MRSA infections. Test performance is not FDA approved in patients less than 35 years old. Performed at [facility], [place] [address], [place], [place] [address]     Renal Function: Recent Labs    07/13/21 0350 07/14/21 0318 07/15/21 0554 07/16/21 0446 07/16/21 1621 07/17/21 0527 07/17/21 1613  CREATININE 2.34* 2.97* 4.08* 4.90* 5.14* 5.01* 4.93*   Estimated Creatinine Clearance: 13.6 mL/min (A) (by C-G formula based on SCr of 4.93 mg/dL (H)).  Radiologic Imaging: US RENAL  Result Date: 07/17/2021 CLINICAL DATA:  Acute renal failure. History of exploratory laparotomy 02/18/2011. EXAM: RENAL / URINARY TRACT ULTRASOUND COMPLETE COMPARISON:  Abdomen pelvis 08/30/2016. FINDINGS: Limited abdominal  ultrasound due to body habitus and bowel gas. Right Kidney: Renal measurements: 10.1 x 5.6 x 5.8 cm = volume: 173 mL. Echogenicity within normal limits. Likely cystic 2.9 x 1.9 x 2.7 cm right renal lesion along the inferior pole. Hypoechoic 2.9 x 2.4 x 3.2 hypoechoic lesion within the right kidney with associated posterior acoustic shadowing. No definite associated intralesional vascularity. No hydronephrosis visualized. Left Kidney: Renal measurements: 10.7 x 6 x 6.1 cm = volume: 205 mL. Echogenicity within normal limits. No mass or hydronephrosis visualized. Urinary bladder: There is a 4.3 x 2.6 x 3.5 cm hypoechoic lesion along the urinary bladder  lumen. Other: None. IMPRESSION: 1. Limited abdominal ultrasound due to body habitus and bowel gas. 2. A 4.3 x 2.6 x 3.5 cm hypoechoic lesion along the urinary bladder lumen. Finding could represent a blood clot versus mass. Recommend direct visualization. 3. Query solid 2.9 x 2.4 x 2.2 cm right renal lesion. Consider MRI renal protocol for further evaluation. 4. Likely cystic 2.9 x 1.9 x 2.7 cm right renal lesion along the inferior pole. Electronically Signed   By: Iven Finn M.D.   On: 07/17/2021 17:30   DG Chest Port 1 View  Result Date: 07/17/2021 CLINICAL DATA:  Central line placement EXAM: PORTABLE CHEST 1 VIEW COMPARISON:  07/17/2021 at 1130 hours FINDINGS: Interval placement of left subclavian approach central venous catheter with distal tip terminating at the level of the distal SVC. Stable heart size. Aortic atherosclerosis. Low lung volumes. Extensive patchy airspace opacities throughout both lungs. Probable small bilateral pleural effusions. No pneumothorax. IMPRESSION: Interval placement of left subclavian approach central venous catheter with distal tip terminating at the level of the distal SVC. No pneumothorax. Electronically Signed   By: Davina Poke D.O.   On: 07/17/2021 13:04   DG CHEST PORT 1 VIEW  Result Date: 07/17/2021 CLINICAL DATA:  76 year old male with history of failed left internal jugular catheter placement attempt. EXAM: PORTABLE CHEST 1 VIEW COMPARISON:  Chest x-ray 07/17/2021. FINDINGS: Widespread but patchy airspace consolidation and extensive interstitial prominence noted throughout all aspects of the lungs bilaterally, with worsened aeration compared to the prior study. Small bilateral pleural effusions. Pulmonary vasculature is obscured. No pneumothorax. Heart size is mildly enlarged. Mediastinal contours are distorted by patient positioning. IMPRESSION: 1. Worsening severe multilobar bilateral pneumonia with small bilateral parapneumonic pleural effusions. 2.  Mild cardiomegaly. Electronically Signed   By: Vinnie Langton M.D.   On: 07/17/2021 11:41   DG Chest Port 1 View  Result Date: 07/17/2021 CLINICAL DATA:  Pneumonia EXAM: PORTABLE CHEST 1 VIEW COMPARISON:  AP chest 07/15/2021 FINDINGS: There again moderately decreased lung volumes. Cardiac silhouette again appears moderately enlarged. Mediastinal contours are grossly unremarkable. Given the slightly decreased lung volumes compared to prior, no definite interval change in moderate bilateral interstitial thickening and diffuse bilateral heterogeneous airspace opacities. Aeration may be slightly worsened within the right upper lung compared to prior. Probable small bilateral pleural effusions are similar to prior. No pneumothorax. No acute skeletal abnormality. IMPRESSION:: IMPRESSION: 1. Decreased lung volumes. 2. Unchanged to mildly worsened bilateral interstitial thickening and heterogeneous airspace opacities. This again may represent a combination of pulmonary edema and/or multifocal infection. Electronically Signed   By: Yvonne Kendall M.D.   On: 07/17/2021 08:06   DG Abd Portable 1V  Result Date: 07/17/2021 CLINICAL DATA:  Feeding tube placement EXAM: PORTABLE ABDOMEN - 1 VIEW COMPARISON:  CT 08/30/2016. FINDINGS: Single supine view of the upper abdomen. Feeding tube terminates at the distal stomach  and may be entering the pylorus. Exam is otherwise limited by patient body habitus and technique. No gross free intraperitoneal air. Suspect gas-filled small bowel loops within the upper abdomen. The low pelvis and right-side of the abdomen are excluded. IMPRESSION: Feeding tube terminating at the antral/pyloric region. Electronically Signed   By: Abigail Miyamoto M.D.   On: 07/17/2021 15:24    I independently reviewed the above imaging studies.  Procedure note: Under sterile conditions, I retracted his penile foreskin and suprapubic fat pad to identify his penis and meatus.  I then passed a 28 [place] coud  catheter easily through his urethra into his bladder.  There was immediate return of 1450 mL of clear yellow urine.  Impression/Recommendation Scrotal and penile edema 2.  Bladder mass 3.  Right renal mass  -I was able to retract his penile foreskin and suprapubic fat pad in order to identify his meatus.  I then placed a 16 French Foley catheter without difficulty with immediate return of 1470m clear yellow urine. -The Foley catheter to gravity for I/O. -According to the critical care team, his prognosis is poor. -If he recovers, I recommend further evaluation of possible right renal mass and bladder mass with imaging to include CT hematuria protocol and follow-up in the office for cystoscopy.  I will arrange follow-up with [facility]. -Following peripherally.  Please call with questions.  Matt R. Lucero Ide MD 07/17/2021, 10:28 PM  [facility]  Pager: [phone]

## 2021-07-17 NOTE — Procedures (Signed)
Central Venous Catheter Insertion Procedure Note ? ?Idelia Salm [name].  ?[id number]  ?1945/07/12 ? ?Date:07/17/21  ?Time:12:38 PM  ? ?Provider Performing:Pete E Kary Kos  ? ?Procedure: Insertion of Non-tunneled Central Venous Catheter(36556) without US guidance ? ?Indication(s) ?Medication administration and Difficult access ? ?Consent ?Risks of the procedure as well as the alternatives and risks of each were explained to the patient and/or caregiver.  Consent for the procedure was obtained and is signed in the bedside chart ? ?Anesthesia ?Topical only with 1% lidocaine  ? ?Timeout ?Verified patient identification, verified procedure, site/side was marked, verified correct patient position, special equipment/implants available, medications/allergies/relevant history reviewed, required imaging and test results available. ? ?Sterile Technique ?Maximal sterile technique including full sterile barrier drape, hand hygiene, sterile gown, sterile gloves, mask, hair covering, sterile ultrasound probe cover (if used). ? ?Procedure Description ?Area of catheter insertion was cleaned with chlorhexidine and draped in sterile fashion.  Without real-time ultrasound guidance a central venous catheter was placed into the left subclavian vein. Nonpulsatile blood flow and easy flushing noted in all ports.  The catheter was sutured in place and sterile dressing applied. ? ?Complications/Tolerance ?None; patient tolerated the procedure well. ?Chest X-ray is ordered to verify placement for internal jugular or subclavian cannulation.   Chest x-ray is not ordered for femoral cannulation. ? ?EBL ?Minimal ? ?Specimen(s) ?None ? ?Erick Colace ACNP-BC ?[place] ?Pager # [phone] OR # [phone] if no answer ? ? ?

## 2021-07-18 ENCOUNTER — Inpatient Hospital Stay (HOSPITAL_COMMUNITY): Payer: No Typology Code available for payment source

## 2021-07-18 DIAGNOSIS — I2602 Saddle embolus of pulmonary artery with acute cor pulmonale: Secondary | ICD-10-CM | POA: Diagnosis not present

## 2021-07-18 DIAGNOSIS — J9601 Acute respiratory failure with hypoxia: Secondary | ICD-10-CM | POA: Diagnosis not present

## 2021-07-18 LAB — GLUCOSE, CAPILLARY
Glucose-Capillary: 131 mg/dL — ABNORMAL HIGH (ref 70–99)
Glucose-Capillary: 125 mg/dL — ABNORMAL HIGH (ref 70–99)
Glucose-Capillary: 135 mg/dL — ABNORMAL HIGH (ref 70–99)
Glucose-Capillary: 124 mg/dL — ABNORMAL HIGH (ref 70–99)
Glucose-Capillary: 137 mg/dL — ABNORMAL HIGH (ref 70–99)
Glucose-Capillary: 141 mg/dL — ABNORMAL HIGH (ref 70–99)

## 2021-07-18 LAB — CBC
HCT: 27.9 % — ABNORMAL LOW (ref 39.0–52.0)
Hemoglobin: 8.5 g/dL — ABNORMAL LOW (ref 13.0–17.0)
MCH: 27.2 pg (ref 26.0–34.0)
MCHC: 30.5 g/dL (ref 30.0–36.0)
MCV: 89.1 fL (ref 80.0–100.0)
Platelets: 283 10*3/uL (ref 150–400)
RBC: 3.13 MIL/uL — ABNORMAL LOW (ref 4.22–5.81)
RDW: 15.6 % — ABNORMAL HIGH (ref 11.5–15.5)
WBC: 17.2 10*3/uL — ABNORMAL HIGH (ref 4.0–10.5)
nRBC: 0 % (ref 0.0–0.2)

## 2021-07-18 LAB — BASIC METABOLIC PANEL
Anion gap: 13 (ref 5–15)
BUN: 64 mg/dL — ABNORMAL HIGH (ref 8–23)
CO2: 21 mmol/L — ABNORMAL LOW (ref 22–32)
Calcium: 8.2 mg/dL — ABNORMAL LOW (ref 8.9–10.3)
Chloride: 114 mmol/L — ABNORMAL HIGH (ref 98–111)
Creatinine, Ser: 4.54 mg/dL — ABNORMAL HIGH (ref 0.61–1.24)
GFR, Estimated: 13 mL/min — ABNORMAL LOW (ref >60)
Glucose, Bld: 143 mg/dL — ABNORMAL HIGH (ref 70–99)
Potassium: 4.6 mmol/L (ref 3.5–5.1)
Sodium: 148 mmol/L — ABNORMAL HIGH (ref 135–145)

## 2021-07-18 LAB — MAGNESIUM: Magnesium: 2.1 mg/dL (ref 1.7–2.4)

## 2021-07-18 LAB — PHOSPHORUS: Phosphorus: 7.9 mg/dL — ABNORMAL HIGH (ref 2.5–4.6)

## 2021-07-18 LAB — HEPARIN LEVEL (UNFRACTIONATED): Heparin Unfractionated: 0.43 IU/mL (ref 0.30–0.70)

## 2021-07-18 MED ORDER — SODIUM CHLORIDE 0.9 % IR SOLN
3000.0000 mL | Status: DC
Start: 2021-07-18 — End: 2021-07-24
  Administered 2021-07-18 – 2021-07-23 (×20): 3000 mL

## 2021-07-18 MED ORDER — FINASTERIDE 5 MG PO TABS
5.0000 mg | ORAL_TABLET | Freq: Every day | ORAL | Status: DC
  Administered 2021-07-20 – 2021-07-24 (×5): 5 mg via ORAL
  Filled 2021-07-18 (×6): qty 1

## 2021-07-18 MED ORDER — DEXTROSE 5 % IV SOLN
INTRAVENOUS | Status: DC
  Administered 2021-07-22: 75 mL via INTRAVENOUS

## 2021-07-18 MED ORDER — SODIUM CHLORIDE 0.9 % IR SOLN
3000.0000 mL | Status: DC
Start: 2021-07-18 — End: 2021-07-24
  Administered 2021-07-18 – 2021-07-22 (×18): 3000 mL

## 2021-07-18 MED ORDER — PANTOPRAZOLE SODIUM 40 MG IV SOLR
40.0000 mg | INTRAVENOUS | Status: DC
  Administered 2021-07-19 – 2021-07-22 (×4): 40 mg via INTRAVENOUS
  Filled 2021-07-18 (×4): qty 10

## 2021-07-18 NOTE — Progress Notes (Signed)
Subjective: Still confused. Unable to communicate with me. Remains on BiPaP.   Objective: Vital signs in last 24 hours: Temp:  [97 F (36.1 C)-98.2 F (36.8 C)] 98.1 F (36.7 C) (03/12 0800) Pulse Rate:  [58-75] 74 (03/12 1100) Resp:  [14-31] 19 (03/12 1100) BP: (87-142)/(32-118) 133/61 (03/12 1100) SpO2:  [92 %-100 %] 100 % (03/12 1100) FiO2 (%):  [50 %] 50 % (03/12 1017) Weight:  [96.8 kg] 96.8 kg (03/12 0344)  Intake/Output from previous day: 03/11 0701 - 03/12 0700 In: 11530 [I.V.:2430; IV Piggyback:100] Out: 26500 [Urine:26500] Intake/Output this shift: Total I/O In: 6868.5 [I.V.:428.5; Blood:440; Other:6000] Out: 82993 [Urine:14210]  Physical Exam:  General: Alert and oriented CV: RRR Lungs: Clear Abdomen: Soft, ND, NT; morbidly obese Ext: NT, No erythema  Lab Results: Recent Labs    07/17/21 1613 07/18/21 0201 07/18/21 0345  HGB 8.4* 8.5* 6.8*  HCT 26.5* 27.9* 22.2*   BMET Recent Labs    07/18/21 0345 07/19/21 0708  NA 154* 155*  K 3.1* 3.2*  CL 120* 120*  CO2 26 26  GLUCOSE 145* 151*  BUN 43* 41*  CREATININE 2.77* 2.50*  CALCIUM 7.7* 7.8*     Studies/Results: US RENAL  Result Date: 07/17/2021 CLINICAL DATA:  Acute renal failure. History of exploratory laparotomy 02/18/2011. EXAM: RENAL / URINARY TRACT ULTRASOUND COMPLETE COMPARISON:  Abdomen pelvis 08/30/2016. FINDINGS: Limited abdominal ultrasound due to body habitus and bowel gas. Right Kidney: Renal measurements: 10.1 x 5.6 x 5.8 cm = volume: 173 mL. Echogenicity within normal limits. Likely cystic 2.9 x 1.9 x 2.7 cm right renal lesion along the inferior pole. Hypoechoic 2.9 x 2.4 x 3.2 hypoechoic lesion within the right kidney with associated posterior acoustic shadowing. No definite associated intralesional vascularity. No hydronephrosis visualized. Left Kidney: Renal measurements: 10.7 x 6 x 6.1 cm = volume: 205 mL. Echogenicity within normal limits. No mass or hydronephrosis visualized.  Urinary bladder: There is a 4.3 x 2.6 x 3.5 cm hypoechoic lesion along the urinary bladder lumen. Other: None. IMPRESSION: 1. Limited abdominal ultrasound due to body habitus and bowel gas. 2. A 4.3 x 2.6 x 3.5 cm hypoechoic lesion along the urinary bladder lumen. Finding could represent a blood clot versus mass. Recommend direct visualization. 3. Query solid 2.9 x 2.4 x 2.2 cm right renal lesion. Consider MRI renal protocol for further evaluation. 4. Likely cystic 2.9 x 1.9 x 2.7 cm right renal lesion along the inferior pole. Electronically Signed   By: Iven Finn M.D.   On: 07/17/2021 17:30   DG Chest Port 1 View  Result Date: 07/18/2021 CLINICAL DATA:  Acute respiratory distress.  Pulmonary embolism. EXAM: PORTABLE CHEST 1 VIEW COMPARISON:  07/17/2021 FINDINGS: Left subclavian central venous catheter remains in appropriate position. Low lung volumes again noted. Heart size is within normal limits. Moderate diffuse bilateral pulmonary airspace disease shows no significant change. Probable tiny right pleural effusion is also stable. IMPRESSION: Stable diffuse bilateral pulmonary airspace disease and probable tiny right pleural effusion. Electronically Signed   By: Marlaine Hind M.D.   On: 07/18/2021 11:53   DG Chest Port 1 View  Result Date: 07/17/2021 CLINICAL DATA:  Central line placement EXAM: PORTABLE CHEST 1 VIEW COMPARISON:  07/17/2021 at 1130 hours FINDINGS: Interval placement of left subclavian approach central venous catheter with distal tip terminating at the level of the distal SVC. Stable heart size. Aortic atherosclerosis. Low lung volumes. Extensive patchy airspace opacities throughout both lungs. Probable small bilateral pleural effusions. No pneumothorax. IMPRESSION:  Interval placement of left subclavian approach central venous catheter with distal tip terminating at the level of the distal SVC. No pneumothorax. Electronically Signed   By: Davina Poke D.O.   On: 07/17/2021 13:04    DG Abd Portable 1V  Result Date: 07/17/2021 CLINICAL DATA:  Feeding tube placement EXAM: PORTABLE ABDOMEN - 1 VIEW COMPARISON:  CT 08/30/2016. FINDINGS: Single supine view of the upper abdomen. Feeding tube terminates at the distal stomach and may be entering the pylorus. Exam is otherwise limited by patient body habitus and technique. No gross free intraperitoneal air. Suspect gas-filled small bowel loops within the upper abdomen. The low pelvis and right-side of the abdomen are excluded. IMPRESSION: Feeding tube terminating at the antral/pyloric region. Electronically Signed   By: Abigail Miyamoto M.D.   On: 07/17/2021 15:24    Assessment/Plan: Acute urinary retention: Was voiding with likely overflow incontinence. Foley placed 3/10 with initial return of 1.5L clear yellow urine Scrotal and penile edema Bladder mass (seen on CT 2018 and RUS 07/17/21). This measured 4.3x2.6x3.5cm. 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 Unable to rule out malignancy yet. Right renal mass (RUS 07/17/21 with 2.9x2.4x2.2 solid right renal lesion) Gross hematuria: Likely prostatic in origin. Developed only after foley was placed. I suspect foley balloon was pulled into the prostate. I performed bedside u/s 3/11 after irrigating foley and noted decompressed bladder around foley with no mass identified. Admitted with acute respiratory failure with hypoxemia due to saddle pulmonary embolism, submassive with RV strain Hypotension due to precedex Acute metabolic encephalopathy from uremia   -Mannually irrigated 551m normal saline with return of light pink urine, no clots. -Continue CBI.  -Manually irrigate q4 hours and prn as needed for clots or decreased drainage -I discussed with his daughter at bedside that he is not a good surgical candidate and fulguration would likely only exacerbate the bleeding -Difficult situation given that he requires hep gtt for saddle PE -Start finasteride whenever he is able to  take. Pharmacy alerted me that as he pulled out his G-tube he cannot receive right now. -If continues to be persistent, may need to consider prostatic artery embolization if he could tolerate that procedure -Will need CT hematuria protocol once medically able -Will need cystoscopy outpatient -Following   LOS: 12 days   Matt R. Latoia Eyster MD 07/19/2021, 11:53 AM [facility]  Pager: [phone]

## 2021-07-18 NOTE — Progress Notes (Signed)
Patient taken off BiPAP and placed on HFNC at this time. 30L and 50%. Tolerating well. ?

## 2021-07-18 NOTE — Progress Notes (Signed)
?Subjective: ?After about 3L clear yellow urine after initial foley placement, nursing staff noticed dark red hematuria with clots in the foley. Pt remains on HFNC, mittens in place, unable to communicate with me.  ? ?Objective: ?Vital signs in last 24 hours: ?Temp:  [96.8 ?F (36 ?C)-98.2 ?F (36.8 ?C)] 97.6 ?F (36.4 ?C) (03/11 0000) ?Pulse Rate:  [55-92] 57 (03/11 0301) ?Resp:  [0-34] 14 (03/11 0301) ?BP: (66-170)/(46-133) 110/84 (03/11 0301) ?SpO2:  [85 %-99 %] 98 % (03/11 0301) ?FiO2 (%):  [40 %-60 %] 60 % (03/10 1934) ? ?Intake/Output from previous day: ?03/10 0701 - 03/11 0700 ?In: 4654.1 [I.V.:1417.5; NG/GT:135.7; IV Piggyback:100.9] ?Out: 3400 [Urine:3400] ?Intake/Output this shift: ?Total I/O ?In: 3538.3 [I.V.:428.3; Other:3000; NG/GT:110] ?Out: 3150 [Urine:3150] ? ?Physical Exam:  ?General: Alert and oriented ?CV: RRR ?Lungs: Clear ?Abdomen: Soft, ND, NT, large pannus and suprapubic fat pad ?Ext: NT, No erythema ? ?Lab Results: ?Recent Labs  ?  07/17/21 ?7829 07/17/21 ?1613 07/18/21 ?0201  ?HGB 7.8* 8.4* 8.5*  ?HCT 24.6* 26.5* 27.9*  ? ?BMET ?Recent Labs  ?  07/17/21 ?1613 07/18/21 ?0201  ?NA 143 148*  ?K 4.3 4.6  ?CL 110 114*  ?CO2 18* 21*  ?GLUCOSE 97 143*  ?BUN 68* 64*  ?CREATININE 4.93* 4.54*  ?CALCIUM 8.1* 8.2*  ? ? ? ?Studies/Results: ?US RENAL ? ?Result Date: 07/17/2021 ?CLINICAL DATA:  Acute renal failure. History of exploratory laparotomy 02/18/2011. EXAM: RENAL / URINARY TRACT ULTRASOUND COMPLETE COMPARISON:  Abdomen pelvis 08/30/2016. FINDINGS: Limited abdominal ultrasound due to body habitus and bowel gas. Right Kidney: Renal measurements: 10.1 x 5.6 x 5.8 cm = volume: 173 mL. Echogenicity within normal limits. Likely cystic 2.9 x 1.9 x 2.7 cm right renal lesion along the inferior pole. Hypoechoic 2.9 x 2.4 x 3.2 hypoechoic lesion within the right kidney with associated posterior acoustic shadowing. No definite associated intralesional vascularity. No hydronephrosis visualized. Left Kidney:  Renal measurements: 10.7 x 6 x 6.1 cm = volume: 205 mL. Echogenicity within normal limits. No mass or hydronephrosis visualized. Urinary bladder: There is a 4.3 x 2.6 x 3.5 cm hypoechoic lesion along the urinary bladder lumen. Other: None. IMPRESSION: 1. Limited abdominal ultrasound due to body habitus and bowel gas. 2. A 4.3 x 2.6 x 3.5 cm hypoechoic lesion along the urinary bladder lumen. Finding could represent a blood clot versus mass. Recommend direct visualization. 3. Query solid 2.9 x 2.4 x 2.2 cm right renal lesion. Consider MRI renal protocol for further evaluation. 4. Likely cystic 2.9 x 1.9 x 2.7 cm right renal lesion along the inferior pole. Electronically Signed   By: Iven Finn M.D.   On: 07/17/2021 17:30  ? ?DG Chest Port 1 View ? ?Result Date: 07/17/2021 ?CLINICAL DATA:  Central line placement EXAM: PORTABLE CHEST 1 VIEW COMPARISON:  07/17/2021 at 1130 hours FINDINGS: Interval placement of left subclavian approach central venous catheter with distal tip terminating at the level of the distal SVC. Stable heart size. Aortic atherosclerosis. Low lung volumes. Extensive patchy airspace opacities throughout both lungs. Probable small bilateral pleural effusions. No pneumothorax. IMPRESSION: Interval placement of left subclavian approach central venous catheter with distal tip terminating at the level of the distal SVC. No pneumothorax. Electronically Signed   By: Davina Poke D.O.   On: 07/17/2021 13:04  ? ?DG CHEST PORT 1 VIEW ? ?Result Date: 07/17/2021 ?CLINICAL DATA:  76 year old male with history of failed left internal jugular catheter placement attempt. EXAM: PORTABLE CHEST 1 VIEW COMPARISON:  Chest x-ray 07/17/2021. FINDINGS:  Widespread but patchy airspace consolidation and extensive interstitial prominence noted throughout all aspects of the lungs bilaterally, with worsened aeration compared to the prior study. Small bilateral pleural effusions. Pulmonary vasculature is obscured. No  pneumothorax. Heart size is mildly enlarged. Mediastinal contours are distorted by patient positioning. IMPRESSION: 1. Worsening severe multilobar bilateral pneumonia with small bilateral parapneumonic pleural effusions. 2. Mild cardiomegaly. Electronically Signed   By: Vinnie Langton M.D.   On: 07/17/2021 11:41  ? ?DG Chest Port 1 View ? ?Result Date: 07/17/2021 ?CLINICAL DATA:  Pneumonia EXAM: PORTABLE CHEST 1 VIEW COMPARISON:  AP chest 07/15/2021 FINDINGS: There again moderately decreased lung volumes. Cardiac silhouette again appears moderately enlarged. Mediastinal contours are grossly unremarkable. Given the slightly decreased lung volumes compared to prior, no definite interval change in moderate bilateral interstitial thickening and diffuse bilateral heterogeneous airspace opacities. Aeration may be slightly worsened within the right upper lung compared to prior. Probable small bilateral pleural effusions are similar to prior. No pneumothorax. No acute skeletal abnormality. IMPRESSION:: IMPRESSION: 1. Decreased lung volumes. 2. Unchanged to mildly worsened bilateral interstitial thickening and heterogeneous airspace opacities. This again may represent a combination of pulmonary edema and/or multifocal infection. Electronically Signed   By: Yvonne Kendall M.D.   On: 07/17/2021 08:06  ? ?DG Abd Portable 1V ? ?Result Date: 07/17/2021 ?CLINICAL DATA:  Feeding tube placement EXAM: PORTABLE ABDOMEN - 1 VIEW COMPARISON:  CT 08/30/2016. FINDINGS: Single supine view of the upper abdomen. Feeding tube terminates at the distal stomach and may be entering the pylorus. Exam is otherwise limited by patient body habitus and technique. No gross free intraperitoneal air. Suspect gas-filled small bowel loops within the upper abdomen. The low pelvis and right-side of the abdomen are excluded. IMPRESSION: Feeding tube terminating at the antral/pyloric region. Electronically Signed   By: Abigail Miyamoto M.D.   On: 07/17/2021 15:24    ? ?Assessment/Plan: ?Acute urinary retention ?Scrotal and penile edema ?Bladder mass ?Right renal mass ? Gross hematuria ? ?-Foley placement yesterday evening yielded approximately 3 L clear yellow urine.  Following this, he developed gross hematuria with clots in the catheter. ?-I exchange the indwelling catheter with a 20 French three-way Foley catheter.  I was unable to pass a 24 [place] catheter as he had a narrowed fossa navicularis. ?-I irrigated the catheter with return of no clots.  He did have dark pink urine. ?-Initiated continuous bladder irrigation with pink-tinged urine.  Titrate to pink-tinged urine.  Manually irrigate every 4 hours and as needed for clots or decreased drainage. ?-I performed bedside ultrasound demonstrated no significant clot burden within his bladder. ?-Difficult situation as he remains on heparin drip for his bilateral pulmonary embolisms and is unlikely that he will be able to do come off of his anticoagulation. ?-Trend hemoglobin ?-If he gains meaningful recovery, recommend CT hematuria protocol. ?-Following ? ? ? LOS: 11 days  ? ?Matt R. Amneet Cendejas MD ?07/18/2021, 3:06 AM ?[facility]  ?Pager: [phone] ? ? ? ?

## 2021-07-18 NOTE — Progress Notes (Signed)
[facility] ROUNDING NOTE   Subjective:   Interval History: This is a 76 year old gentleman who presented 06/20/2021 to [facility] with large saddle pulmonary embolus.  He was initially found to be in renal failure with a creatinine of 2.2 mg/dL.  This seemed to improve.  He underwent CT angiogram 07/10/2021 with the use of 80 cc omnipaque.  He had had some episodes of hypotension.  His creatinine worsened from 07/10/2021. Urinalysis did show some RBCs and proteinuria although this could be consistent with some Foley trauma.  He is requiring intravenous anticoagulation.  Blood pressure 136/76 pulse 86 temperature is 96 O2 sats 89% high flow nasal cannula  Sodium 148 potassium 4.6 chloride 114 CO2 21 BUN 64 creatinine is 4.5 glucose 143 calcium 8.2 hemoglobin 8.5  Urine output unable to measure due to continuous bladder irrigation.  Does appear to be making some urine and creatinine seems to be improving    Objective:  Vital signs in last 24 hours:  Temp:  [96 F (35.6 C)-98.2 F (36.8 C)] 96 F (35.6 C) (03/11 0800) Pulse Rate:  [52-92] 59 (03/11 0800) Resp:  [12-34] 22 (03/11 0800) BP: (66-153)/(49-128) 84/75 (03/11 0800) SpO2:  [85 %-99 %] 94 % (03/11 0800) FiO2 (%):  [40 %-60 %] 50 % (03/11 0742) Weight:  [101.3 kg] 101.3 kg (03/11 0500)  Weight change:  Filed Weights   07/14/21 1700 07/15/21 0500 07/18/21 0500  Weight: 99.6 kg 99.8 kg 101.3 kg    Intake/Output: I/O last 3 completed shifts: In: 10043.9 [P.O.:650; I.V.:3157.3; Other:6000; NG/GT:135.7; IV Piggyback:100.9] Out: 20250 [Urine:20250]   Intake/Output this shift:  Total I/O In: 79.4 [I.V.:79.4] Out: 1600 [Urine:1600]  CVS- RRR JVP not distended RS- CTA no wheezes or rales ABD- BS present soft non-distended EXT-mild lower extremity swelling   Basic Metabolic Panel: Recent Labs  Lab 07/14/21 0318 07/15/21 0554 07/16/21 0446 07/16/21 1159 07/16/21 1621 07/17/21 0527 07/17/21 1613  07/18/21 0201  NA 144   < > 141 141 142 142 143 148*  K 4.2   < > 4.6 4.6 4.6 4.6 4.3 4.6  CL 113*   < > 110  --  111 111 110 114*  CO2 18*   < > 17*  --  17* 18* 18* 21*  GLUCOSE 104*   < > 95  --  91 101* 97 143*  BUN 48*   < > 60*  --  62* 65* 68* 64*  CREATININE 2.97*   < > 4.90*  --  5.14* 5.01* 4.93* 4.54*  CALCIUM 7.5*   < > 8.0*  --  8.0* 8.0* 8.1* 8.2*  MG 1.7  --   --   --   --   --  2.1 2.1  PHOS 6.6*  --   --   --   --   --  8.1* 7.9*   < > = values in this interval not displayed.     Liver Function Tests: Recent Labs  Lab 07/12/21 0306 07/13/21 0350  AST 14* 14*  ALT 17 15  ALKPHOS 117 112  BILITOT <0.1* 0.4  PROT 5.7* 5.4*  ALBUMIN 2.5* 2.3*    No results for input(s): LIPASE, AMYLASE in the last 168 hours. No results for input(s): AMMONIA in the last 168 hours.  CBC: Recent Labs  Lab 07/12/21 0306 07/12/21 1143 07/13/21 0350 07/14/21 0318 07/15/21 0554 07/16/21 0446 07/16/21 1159 07/17/21 0527 07/17/21 1613 07/18/21 0201  WBC 14.4*   < > 13.4*   < >  14.0* 14.4*  --  12.5* 15.0* 17.2*  NEUTROABS 10.9*  --  10.1*  --   --   --   --   --   --   --   HGB 10.9*   < > 9.5*   < > 8.9* 9.4* 8.5* 7.8* 8.4* 8.5*  HCT 34.2*   < > 31.7*   < > 28.2* 30.3* 25.0* 24.6* 26.5* 27.9*  MCV 87.5   < > 88.5   < > 87.9 87.6  --  87.5 87.2 89.1  PLT 243   < > 236   < > 216 224  --  216 263 283   < > = values in this interval not displayed.     Cardiac Enzymes: Recent Labs  Lab 07/14/21 1300  CKTOTAL 51     BNP: Invalid input(s): POCBNP  CBG: Recent Labs  Lab 07/17/21 1932 07/17/21 2332 07/18/21 0335 07/18/21 0738  GLUCAP 117* 132* 131* 125*     Microbiology: Results for orders placed or performed during the hospital encounter of 06/27/2021  Resp Panel by RT-PCR (Flu A&B, Covid) Nasopharyngeal Swab     Status: None   Collection Time: 06/21/2021  9:28 AM   Specimen: Nasopharyngeal Swab; Nasopharyngeal(NP) swabs in vial transport medium  Result Value  Ref Range Status   SARS Coronavirus 2 by RT PCR NEGATIVE NEGATIVE Final    Comment: (NOTE) SARS-CoV-2 target nucleic acids are NOT DETECTED.  The SARS-CoV-2 RNA is generally detectable in upper respiratory specimens during the acute phase of infection. The lowest concentration of SARS-CoV-2 viral copies this assay can detect is 138 copies/mL. A negative result does not preclude SARS-Cov-2 infection and should not be used as the sole basis for treatment or other patient management decisions. A negative result may occur with  improper specimen collection/handling, submission of specimen other than nasopharyngeal swab, presence of viral mutation(s) within the areas targeted by this assay, and inadequate number of viral copies(<138 copies/mL). A negative result must be combined with clinical observations, patient history, and epidemiological information. The expected result is Negative.  Fact Sheet for Patients:  EntrepreneurPulse.com.au  Fact Sheet for Healthcare Providers:  IncredibleEmployment.be  This test is no t yet approved or cleared by the [place] FDA and  has been authorized for detection and/or diagnosis of SARS-CoV-2 by FDA under an Emergency Use Authorization (EUA). This EUA will remain  in effect (meaning this test can be used) for the duration of the COVID-19 declaration under Section 564(b)(1) of the Act, 21 U.S.C.section 360bbb-3(b)(1), unless the authorization is terminated  or revoked sooner.       Influenza A by PCR NEGATIVE NEGATIVE Final   Influenza B by PCR NEGATIVE NEGATIVE Final    Comment: (NOTE) The Xpert Xpress SARS-CoV-2/FLU/RSV plus assay is intended as an aid in the diagnosis of influenza from Nasopharyngeal swab specimens and should not be used as a sole basis for treatment. Nasal washings and aspirates are unacceptable for Xpert Xpress SARS-CoV-2/FLU/RSV testing.  Fact Sheet for  Patients: EntrepreneurPulse.com.au  Fact Sheet for Healthcare Providers: IncredibleEmployment.be  This test is not yet approved or cleared by the [place] FDA and has been authorized for detection and/or diagnosis of SARS-CoV-2 by FDA under an Emergency Use Authorization (EUA). This EUA will remain in effect (meaning this test can be used) for the duration of the COVID-19 declaration under Section 564(b)(1) of the Act, 21 U.S.C. section 360bbb-3(b)(1), unless the authorization is terminated or revoked.  Performed at [facility]  [facility], [address], [place], [place] [address]   Blood Culture (routine x 2)     Status: None   Collection Time: 06/22/2021  9:31 AM   Specimen: BLOOD  Result Value Ref Range Status   Specimen Description BLOOD BLOOD LEFT ARM  Final   Special Requests   Final    BOTTLES DRAWN AEROBIC AND ANAEROBIC Blood Culture adequate volume   Culture   Final    NO GROWTH 6 DAYS Performed at [facility], [address], [place], [place] [address]    Report Status 07/13/2021 FINAL  Final  Blood Culture (routine x 2)     Status: None   Collection Time: 06/14/2021  9:51 AM   Specimen: BLOOD  Result Value Ref Range Status   Specimen Description BLOOD RIGHT ANTECUBITAL  Final   Special Requests   Final    BOTTLES DRAWN AEROBIC AND ANAEROBIC Blood Culture adequate volume   Culture   Final    NO GROWTH 6 DAYS Performed at [facility], [address], [place], [place] [address]    Report Status 07/13/2021 FINAL  Final  MRSA Next Gen by PCR, Nasal     Status: None   Collection Time: 07/06/2021  1:11 PM   Specimen: Nasal Mucosa; Nasal Swab  Result Value Ref Range Status   MRSA by PCR Next Gen NOT DETECTED NOT DETECTED Final    Comment: (NOTE) The GeneXpert MRSA Assay (FDA approved for NASAL specimens only), is one component of a comprehensive MRSA colonization surveillance program. It is not intended to diagnose MRSA infection nor to  guide or monitor treatment for MRSA infections. Test performance is not FDA approved in patients less than 4 years old. Performed at [facility], [address], [place], [place] [address]   MRSA Next Gen by PCR, Nasal     Status: None   Collection Time: 07/15/21  4:48 PM   Specimen: Nasal Mucosa; Nasal Swab  Result Value Ref Range Status   MRSA by PCR Next Gen NOT DETECTED NOT DETECTED Final    Comment: (NOTE) The GeneXpert MRSA Assay (FDA approved for NASAL specimens only), is one component of a comprehensive MRSA colonization surveillance program. It is not intended to diagnose MRSA infection nor to guide or monitor treatment for MRSA infections. Test performance is not FDA approved in patients less than 52 years old. Performed at [facility], [place] [address] [place], [place] [address]     Coagulation Studies: No results for input(s): LABPROT, INR in the last 72 hours.   Urinalysis: No results for input(s): COLORURINE, LABSPEC, PHURINE, GLUCOSEU, HGBUR, BILIRUBINUR, KETONESUR, PROTEINUR, UROBILINOGEN, NITRITE, LEUKOCYTESUR in the last 72 hours.  Invalid input(s): APPERANCEUR    Imaging: US RENAL  Result Date: 07/17/2021 CLINICAL DATA:  Acute renal failure. History of exploratory laparotomy 02/18/2011. EXAM: RENAL / URINARY TRACT ULTRASOUND COMPLETE COMPARISON:  Abdomen pelvis 08/30/2016. FINDINGS: Limited abdominal ultrasound due to body habitus and bowel gas. Right Kidney: Renal measurements: 10.1 x 5.6 x 5.8 cm = volume: 173 mL. Echogenicity within normal limits. Likely cystic 2.9 x 1.9 x 2.7 cm right renal lesion along the inferior pole. Hypoechoic 2.9 x 2.4 x 3.2 hypoechoic lesion within the right kidney with associated posterior acoustic shadowing. No definite associated intralesional vascularity. No hydronephrosis visualized. Left Kidney: Renal measurements: 10.7 x 6 x 6.1 cm = volume: 205 mL. Echogenicity within normal limits. No mass or hydronephrosis visualized.  Urinary bladder: There is a 4.3 x 2.6 x 3.5 cm  hypoechoic lesion along the urinary bladder lumen. Other: None. IMPRESSION: 1. Limited abdominal ultrasound due to body habitus and bowel gas. 2. A 4.3 x 2.6 x 3.5 cm hypoechoic lesion along the urinary bladder lumen. Finding could represent a blood clot versus mass. Recommend direct visualization. 3. Query solid 2.9 x 2.4 x 2.2 cm right renal lesion. Consider MRI renal protocol for further evaluation. 4. Likely cystic 2.9 x 1.9 x 2.7 cm right renal lesion along the inferior pole. Electronically Signed   By: Iven Finn M.D.   On: 07/17/2021 17:30   DG Chest Port 1 View  Result Date: 07/17/2021 CLINICAL DATA:  Central line placement EXAM: PORTABLE CHEST 1 VIEW COMPARISON:  07/17/2021 at 1130 hours FINDINGS: Interval placement of left subclavian approach central venous catheter with distal tip terminating at the level of the distal SVC. Stable heart size. Aortic atherosclerosis. Low lung volumes. Extensive patchy airspace opacities throughout both lungs. Probable small bilateral pleural effusions. No pneumothorax. IMPRESSION: Interval placement of left subclavian approach central venous catheter with distal tip terminating at the level of the distal SVC. No pneumothorax. Electronically Signed   By: Davina Poke D.O.   On: 07/17/2021 13:04   DG CHEST PORT 1 VIEW  Result Date: 07/17/2021 CLINICAL DATA:  76 year old male with history of failed left internal jugular catheter placement attempt. EXAM: PORTABLE CHEST 1 VIEW COMPARISON:  Chest x-ray 07/17/2021. FINDINGS: Widespread but patchy airspace consolidation and extensive interstitial prominence noted throughout all aspects of the lungs bilaterally, with worsened aeration compared to the prior study. Small bilateral pleural effusions. Pulmonary vasculature is obscured. No pneumothorax. Heart size is mildly enlarged. Mediastinal contours are distorted by patient positioning. IMPRESSION: 1. Worsening severe  multilobar bilateral pneumonia with small bilateral parapneumonic pleural effusions. 2. Mild cardiomegaly. Electronically Signed   By: Vinnie Langton M.D.   On: 07/17/2021 11:41   DG Chest Port 1 View  Result Date: 07/17/2021 CLINICAL DATA:  Pneumonia EXAM: PORTABLE CHEST 1 VIEW COMPARISON:  AP chest 07/15/2021 FINDINGS: There again moderately decreased lung volumes. Cardiac silhouette again appears moderately enlarged. Mediastinal contours are grossly unremarkable. Given the slightly decreased lung volumes compared to prior, no definite interval change in moderate bilateral interstitial thickening and diffuse bilateral heterogeneous airspace opacities. Aeration may be slightly worsened within the right upper lung compared to prior. Probable small bilateral pleural effusions are similar to prior. No pneumothorax. No acute skeletal abnormality. IMPRESSION:: IMPRESSION: 1. Decreased lung volumes. 2. Unchanged to mildly worsened bilateral interstitial thickening and heterogeneous airspace opacities. This again may represent a combination of pulmonary edema and/or multifocal infection. Electronically Signed   By: Yvonne Kendall M.D.   On: 07/17/2021 08:06   DG Abd Portable 1V  Result Date: 07/17/2021 CLINICAL DATA:  Feeding tube placement EXAM: PORTABLE ABDOMEN - 1 VIEW COMPARISON:  CT 08/30/2016. FINDINGS: Single supine view of the upper abdomen. Feeding tube terminates at the distal stomach and may be entering the pylorus. Exam is otherwise limited by patient body habitus and technique. No gross free intraperitoneal air. Suspect gas-filled small bowel loops within the upper abdomen. The low pelvis and right-side of the abdomen are excluded. IMPRESSION: Feeding tube terminating at the antral/pyloric region. Electronically Signed   By: Abigail Miyamoto M.D.   On: 07/17/2021 15:24     Medications:    sodium chloride Stopped (07/12/21 2034)   ceFEPime (MAXIPIME) IV Stopped (07/17/21 1716)   dexmedetomidine  (PRECEDEX) IV infusion 0.4 mcg/kg/hr (07/18/21 0800)   feeding supplement (VITAL 1.5 CAL)  Stopped (07/17/21 2100)   heparin 1,750 Units/hr (07/18/21 0800)   norepinephrine (LEVOPHED) Adult infusion 2 mcg/min (07/18/21 0800)   sodium bicarbonate 150 mEq in D5W infusion 50 mL/hr at 07/18/21 0800   sodium chloride irrigation     sodium chloride irrigation      buprenorphine  1 patch Transdermal Weekly   Chlorhexidine Gluconate Cloth  6 each Topical Q0600   feeding supplement (PROSource TF)  45 mL Per Tube BID   lidocaine  1 application. Urethral Once   mouth rinse  15 mL Mouth Rinse BID   pantoprazole (PROTONIX) IV  40 mg Intravenous Q12H   sodium chloride, acetaminophen **OR** acetaminophen, hydrALAZINE, ipratropium-albuterol, [DISCONTINUED] ondansetron **OR** ondansetron (ZOFRAN) IV  Assessment/ Plan:  Acute kidney injury.  This appears to be temporally related to the use of intravenous contrast and hypotension.  I believe this is the etiology of his worsening renal failure.  He continues to urinate and is nonoliguric.  Renal ultrasound is pending.  Urine microscopy is consistent with Foley trauma.  I doubt this represents an acute glomerulonephritis.  We discussed dialysis.  Discussed with patient's daughter.  Renal function does appear to be slightly improved. ANEMIA-stable at this time. MBD-mild hyperphosphatemia CPK normal range HTN/VOL-volume status slightly hypervolemic.  We will continue to follow and no IV Lasix at this point.  Blood pressures appear to be marginal.  No antihypertensives on hold Pulmonary embolus saddle embolus with hypotension hemodynamic compromise.  Continues to be hypotensive prognosis may be poor. Hyper natremia we will add free water      LOS: [place] '@TODAY''@9'$ :49 AM

## 2021-07-18 NOTE — Progress Notes (Addendum)
ANTICOAGULATION CONSULT NOTE ? ?Pharmacy Consult for heparin ?Indication: pulmonary embolus ? ?Allergies  ?Allergen Reactions  ? Influenza Virus Vacc Split Pf Hives  ? Dye Fdc Red [Red Dye]   ? ? ?Patient Measurements: ?Height: '5\' 5"'$  (165.1 cm) ?Weight: 101.3 kg (223 lb 5.2 oz) ?IBW/kg (Calculated) : 61.5 ?HEPARIN DW (KG): 82.1  ? ?Labs: ?Recent Labs  ?  07/17/21 ?4098 07/17/21 ?1613 07/18/21 ?0201  ?HGB 7.8* 8.4* 8.5*  ?HCT 24.6* 26.5* 27.9*  ?PLT 216 263 283  ?HEPARINUNFRC 0.25* 0.32 0.43  ?CREATININE 5.01* 4.93* 4.54*  ? ? ? ?Estimated Creatinine Clearance: 14.9 mL/min (A) (by C-G formula based on SCr of 4.54 mg/dL (H)). ? ?Assessment: ?63 YOM admitted for SOB, found to have acute PE with evidence of RHS (RV/LV ratio = 1.3) consistent with at least submassive PE. Patient transferred from [facility] to [facility] for possible IR intervention vs. thrombolytic. Patient is s/p systemic tPA on 3/5 and heparin was resumed.  ? ?Heparin level therapeutic at 0.43 units/mL.  Noted new hematuria with clots and Urology irrigating bladder. ? ?Goal of Therapy:  ?Heparin level 0.3-0.5 units/mL with new hematuria ?Monitor platelets by anticoagulation protocol: Yes ?  ?Plan:  ?Continue heparin infusion at 1750 units/hr  ?Daily heparin level and CB ?Monitor for hematuria resolution ? ?Fortino Haag D. Mina Marble, PharmD, BCPS, BCCCP ?07/18/2021, 8:18 AM ? ?

## 2021-07-18 NOTE — Progress Notes (Signed)
? ?NAME:  Jason Holt., MRN:  [id number], DOB:  25-Jan-1946, LOS: 11 ?ADMISSION DATE:  06/16/2021, CONSULTATION DATE:  2/28 ?REFERRING MD:  Wynetta Emery, CHIEF COMPLAINT:  Dyspnea  ? ?History of Present Illness:  ?76 yo male presented to [facility] with dyspnea for 3 days.  EMS called on 2/27 and he had SpO2 in the 80's on room air.  He declined option to come to ER then.  Symptoms got worse and he came to ER.  SpO2 in 70's on room air.  Found to have Rt sided pneumonia.  Started on IV fluids and antibiotics for sepsis, and Bipap with oxygen for respiratory failure.  Had persistent hypoxia and had CT angiogram showed acute saddle PE.  Transferred to [facility] for further management. ? ?Pertinent  Medical History  ?HTN, Depression, GERD ? ?Significant Hospital Events: ?Including procedures, antibiotic start and stop dates in addition to other pertinent events   ?2/28 admission, treatment for pneumonia with ceftriaxone/azithro, elevated troponin, treated with BIPAP ?3/1 TTE> LVEF 55-60%, mild LVH, D shaped septum, evidence of RV overload ?3/3 found to have saddle PE on CT angio, patchy ggo bilaterally; negative lower ext doppler, started on heparin ?3/4 moved to [facility] for persistent hypoxemia, remains on HHF ?3/5 no change in oxygen needs, given full dose TPA ?3/6 weaned O2 needs, tolerating diet. ?3/7 nephro consulted d/t rising cr. They felt mixed picture of Hypotension and Contrast dye injury.  Received Lasix IV. ?3/8 still on 50% heated high flow. Renal fxn worse.  Added cefepime.  For possible hcap, MRSA PCR again negative.  Bicarbonate supplementation started ?3/9: Renal function continues to worsen.  Urine output dropping.  Increasing bicarbonate, adding back gentle IV hydration.  No improvement in oxygen requirements ?3/10 on Precedex and BIPAP. BP boarderline hypotension, bradycardia all 2/2 dex. Increased edema on CXR. Place CVL for co-ox monitoring and CVP, creatinine hitting plateau and UOP picking up  ? ?Interim  History / Subjective:  ?Remains on increased FiO2.  Used Bipap overnight.  Pulled out cortrak. ? ?Objective   ?Blood pressure (!) 150/122, pulse 65, temperature (!) 96 ?F (35.6 ?C), temperature source Axillary, resp. rate 20, height '5\' 5"'$  (1.651 m), weight 101.3 kg, SpO2 94 %. ?CVP:  [7 mmHg-26 mmHg] 7 mmHg  ?FiO2 (%):  [40 %-60 %] 50 %  ? ?Intake/Output Summary (Last 24 hours) at 07/18/2021 1158 ?Last data filed at 07/18/2021 1117 ?Gross per 24 hour  ?Intake 8801.67 ml  ?Output 22750 ml  ?Net -13948.33 ml  ? ?Filed Weights  ? 07/14/21 1700 07/15/21 0500 07/18/21 0500  ?Weight: 99.6 kg 99.8 kg 101.3 kg  ? ? ?Examination: ? ?General - somnolent ?Eyes - pupils reactive ?ENT - no sinus tenderness, no stridor ?Cardiac - regular rate/rhythm, no murmur ?Chest - b/l rhonchi ?Abdomen - soft, non tender, + bowel sounds ?Extremities - no cyanosis, clubbing, or edema ?Skin - no rashes ?Neuro - tremulous ? ?Resolved Hospital Problem list   ?Lactic acidosis, Elevated trop d/t right heart strain  ? ?Assessment & Plan:  ? ?Acute respiratory failure with hypoxemia 2/2 Saddle PE, submassive with RV strain complicated by hcap(NOS) and now element of what looks like pulmonary edema. ?- high flow oxygen to keep SpO2 > 90% ?- Bipap qhs anr prn ?- day 5 of cefepime ?- f/u CXR ? ?Drug induced Hypotension 2/2 precedex ?We had been treating him for hypertension  ?- wean off precedex ?- pressors as needed to keep MAP > 65 ? ?AKI from contrast and  post obstructive with bladder mass. ?- nephrology consulted ?- holding off on dialysis for now ?- monitor urine outpt ? ?Acute urine retention, scrotal/penile edema, bladder mass, Rt renal mass, hematuria. ?- urology consulted and placed foley ? ?Acute metabolic encephalopathy from uremia. ?Chronic pain. ?- RASS goal 0 ?- continue buprenorphine ? ?Anemia of critical illness. ?- f/u CBC ?- transfuse for Hb < 7 ? ?Updated pt's daughter at bedside ? ?Best Practice (right click and "Reselect all SmartList  Selections" daily)  ? ?Diet/type: Regular consistency (see orders) and NPO ?DVT prophylaxis: systemic heparin ?GI prophylaxis: N/A ?Lines: N/A ?Foley:  N/A ?Code Status:  full code ?Last date of multidisciplinary goals of care discussion [several were held on 3/4.  See documentation] ?As of 3/10 remains critically ill w/ what seems like poor prognosis at best. He is debilitated, oxygen requirements still significant. I am concerned that he may improve some but not have the reserves to recover ? ? ?Critical care time: 39 minutes  ?Chesley Mires, MD ?[place] ?Pager - [phone] - [phone] ?07/18/2021, 12:15 PM ? ? ? ? ? ? ?

## 2021-07-18 NOTE — Progress Notes (Addendum)
Blood clots coming from the foley, CCM notified, On call urology paged, regarding this ?

## 2021-07-18 NOTE — Progress Notes (Addendum)
?Subjective: ?Off Bipap, comfortable. Family at bedside. ? ?Objective: ?Vital signs in last 24 hours: ?Temp:  [96 ?F (35.6 ?C)-98.2 ?F (36.8 ?C)] 97.1 ?F (36.2 ?C) (03/11 1200) ?Pulse Rate:  [52-92] 67 (03/11 1200) ?Resp:  [13-34] 22 (03/11 1200) ?BP: (66-153)/(49-128) 101/62 (03/11 1200) ?SpO2:  [85 %-99 %] 94 % (03/11 1200) ?FiO2 (%):  [40 %-60 %] 50 % (03/11 0742) ?Weight:  [101.3 kg] 101.3 kg (03/11 0500) ? ?Intake/Output from previous day: ?03/10 0701 - 03/11 0700 ?In: 8913.5 [P.O.:650; I.V.:2026.9; NG/GT:135.7; IV Piggyback:100.9] ?Out: 66440 [HKVQQ:59563] ?Intake/Output this shift: ?Total I/O ?In: 325.1 [I.V.:325.1] ?Out: 6100 [Urine:6100] ? ?Physical Exam:  ?General: Alert and oriented ?CV: RRR ?Lungs: Clear ?Abdomen: Soft, ND, NT; morbidly obese ?Ext: NT, No erythema ? ?Lab Results: ?Recent Labs  ?  07/17/21 ?8756 07/17/21 ?1613 07/18/21 ?0201  ?HGB 7.8* 8.4* 8.5*  ?HCT 24.6* 26.5* 27.9*  ? ?BMET ?Recent Labs  ?  07/17/21 ?1613 07/18/21 ?0201  ?NA 143 148*  ?K 4.3 4.6  ?CL 110 114*  ?CO2 18* 21*  ?GLUCOSE 97 143*  ?BUN 68* 64*  ?CREATININE 4.93* 4.54*  ?CALCIUM 8.1* 8.2*  ? ? ? ?Studies/Results: ?US RENAL ? ?Result Date: 07/17/2021 ?CLINICAL DATA:  Acute renal failure. History of exploratory laparotomy 02/18/2011. EXAM: RENAL / URINARY TRACT ULTRASOUND COMPLETE COMPARISON:  Abdomen pelvis 08/30/2016. FINDINGS: Limited abdominal ultrasound due to body habitus and bowel gas. Right Kidney: Renal measurements: 10.1 x 5.6 x 5.8 cm = volume: 173 mL. Echogenicity within normal limits. Likely cystic 2.9 x 1.9 x 2.7 cm right renal lesion along the inferior pole. Hypoechoic 2.9 x 2.4 x 3.2 hypoechoic lesion within the right kidney with associated posterior acoustic shadowing. No definite associated intralesional vascularity. No hydronephrosis visualized. Left Kidney: Renal measurements: 10.7 x 6 x 6.1 cm = volume: 205 mL. Echogenicity within normal limits. No mass or hydronephrosis visualized. Urinary bladder: There  is a 4.3 x 2.6 x 3.5 cm hypoechoic lesion along the urinary bladder lumen. Other: None. IMPRESSION: 1. Limited abdominal ultrasound due to body habitus and bowel gas. 2. A 4.3 x 2.6 x 3.5 cm hypoechoic lesion along the urinary bladder lumen. Finding could represent a blood clot versus mass. Recommend direct visualization. 3. Query solid 2.9 x 2.4 x 2.2 cm right renal lesion. Consider MRI renal protocol for further evaluation. 4. Likely cystic 2.9 x 1.9 x 2.7 cm right renal lesion along the inferior pole. Electronically Signed   By: Iven Finn M.D.   On: 07/17/2021 17:30  ? ?DG Chest Port 1 View ? ?Result Date: 07/18/2021 ?CLINICAL DATA:  Acute respiratory distress.  Pulmonary embolism. EXAM: PORTABLE CHEST 1 VIEW COMPARISON:  07/17/2021 FINDINGS: Left subclavian central venous catheter remains in appropriate position. Low lung volumes again noted. Heart size is within normal limits. Moderate diffuse bilateral pulmonary airspace disease shows no significant change. Probable tiny right pleural effusion is also stable. IMPRESSION: Stable diffuse bilateral pulmonary airspace disease and probable tiny right pleural effusion. Electronically Signed   By: Marlaine Hind M.D.   On: 07/18/2021 11:53  ? ?DG Chest Port 1 View ? ?Result Date: 07/17/2021 ?CLINICAL DATA:  Central line placement EXAM: PORTABLE CHEST 1 VIEW COMPARISON:  07/17/2021 at 1130 hours FINDINGS: Interval placement of left subclavian approach central venous catheter with distal tip terminating at the level of the distal SVC. Stable heart size. Aortic atherosclerosis. Low lung volumes. Extensive patchy airspace opacities throughout both lungs. Probable small bilateral pleural effusions. No pneumothorax. IMPRESSION: Interval placement of left  subclavian approach central venous catheter with distal tip terminating at the level of the distal SVC. No pneumothorax. Electronically Signed   By: Davina Poke D.O.   On: 07/17/2021 13:04  ? ?DG CHEST PORT 1  VIEW ? ?Result Date: 07/17/2021 ?CLINICAL DATA:  76 year old male with history of failed left internal jugular catheter placement attempt. EXAM: PORTABLE CHEST 1 VIEW COMPARISON:  Chest x-ray 07/17/2021. FINDINGS: Widespread but patchy airspace consolidation and extensive interstitial prominence noted throughout all aspects of the lungs bilaterally, with worsened aeration compared to the prior study. Small bilateral pleural effusions. Pulmonary vasculature is obscured. No pneumothorax. Heart size is mildly enlarged. Mediastinal contours are distorted by patient positioning. IMPRESSION: 1. Worsening severe multilobar bilateral pneumonia with small bilateral parapneumonic pleural effusions. 2. Mild cardiomegaly. Electronically Signed   By: Vinnie Langton M.D.   On: 07/17/2021 11:41  ? ?DG Chest Port 1 View ? ?Result Date: 07/17/2021 ?CLINICAL DATA:  Pneumonia EXAM: PORTABLE CHEST 1 VIEW COMPARISON:  AP chest 07/15/2021 FINDINGS: There again moderately decreased lung volumes. Cardiac silhouette again appears moderately enlarged. Mediastinal contours are grossly unremarkable. Given the slightly decreased lung volumes compared to prior, no definite interval change in moderate bilateral interstitial thickening and diffuse bilateral heterogeneous airspace opacities. Aeration may be slightly worsened within the right upper lung compared to prior. Probable small bilateral pleural effusions are similar to prior. No pneumothorax. No acute skeletal abnormality. IMPRESSION:: IMPRESSION: 1. Decreased lung volumes. 2. Unchanged to mildly worsened bilateral interstitial thickening and heterogeneous airspace opacities. This again may represent a combination of pulmonary edema and/or multifocal infection. Electronically Signed   By: Yvonne Kendall M.D.   On: 07/17/2021 08:06  ? ?DG Abd Portable 1V ? ?Result Date: 07/17/2021 ?CLINICAL DATA:  Feeding tube placement EXAM: PORTABLE ABDOMEN - 1 VIEW COMPARISON:  CT 08/30/2016. FINDINGS:  Single supine view of the upper abdomen. Feeding tube terminates at the distal stomach and may be entering the pylorus. Exam is otherwise limited by patient body habitus and technique. No gross free intraperitoneal air. Suspect gas-filled small bowel loops within the upper abdomen. The low pelvis and right-side of the abdomen are excluded. IMPRESSION: Feeding tube terminating at the antral/pyloric region. Electronically Signed   By: Abigail Miyamoto M.D.   On: 07/17/2021 15:24   ? ?Assessment/Plan: ?Acute urinary retention: Was voiding with likely overflow incontinence. Foley placed 3/10 with initial return of 1.5L clear yellow urine ?Scrotal and penile edema ?Bladder mass (seen on CT 2018 and RUS 07/17/21). This measured 4.3x2.6x3.5cm. 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 Unable to rule out malignancy yet ?Right renal mass (RUS 07/17/21 with 2.9x2.4x2.2 solid right renal lesion) ? Gross hematuria: Likely prostatic in origin. Developed only after foley was placed. I suspect foley balloon was pulled into the prostate. I performed bedside u/s 3/11 after irrigating foley and noted decompressed bladder around foley with no mass identified. ?Admitted with acute respiratory failure with hypoxemia due to saddle pulmonary embolism, submassive with RV strain ?Hypotension due to precedex ?Acute metabolic encephalopathy from uremia ? ?-Mannually irrigated 519m normal saline with return of light pink urine, no clots. ?-Continue CBI.  ?-Manually irrigate q4 hours and prn as needed for clots or decreased drainage ?-I discussed with family at bedside that he is not a good surgical candidate and fulguration would likely only exacerbate the bleeding ?-Difficult situation given that he requires hep gtt for saddle PE ?-Pharmacy alerted me that unable to start finasteride given that he is on tube feeds ?-If continues to  be persistent, may need to consider prostatic artery embolization if he could tolerate that  procedure ?-Will need CT hematuria protocol once medically able ?-Will need cystoscopy outpatient ? ? ? LOS: 11 days  ? ?Matt R. Talise Sligh MD ?07/18/2021, 12:25 PM ?[facility]  ?Pager: [phone] ? ? ? ?

## 2021-07-19 DIAGNOSIS — J9601 Acute respiratory failure with hypoxia: Secondary | ICD-10-CM | POA: Diagnosis not present

## 2021-07-19 LAB — CBC
HCT: 22.2 % — ABNORMAL LOW (ref 39.0–52.0)
HCT: 26.9 % — ABNORMAL LOW (ref 39.0–52.0)
Hemoglobin: 6.8 g/dL — CL (ref 13.0–17.0)
Hemoglobin: 8.4 g/dL — ABNORMAL LOW (ref 13.0–17.0)
MCH: 27.1 pg (ref 26.0–34.0)
MCH: 28.1 pg (ref 26.0–34.0)
MCHC: 30.6 g/dL (ref 30.0–36.0)
MCHC: 31.2 g/dL (ref 30.0–36.0)
MCV: 88.4 fL (ref 80.0–100.0)
MCV: 90 fL (ref 80.0–100.0)
Platelets: 297 10*3/uL (ref 150–400)
Platelets: 334 10*3/uL (ref 150–400)
RBC: 2.51 MIL/uL — ABNORMAL LOW (ref 4.22–5.81)
RBC: 2.99 MIL/uL — ABNORMAL LOW (ref 4.22–5.81)
RDW: 15.8 % — ABNORMAL HIGH (ref 11.5–15.5)
RDW: 15.6 % — ABNORMAL HIGH (ref 11.5–15.5)
WBC: 14.2 10*3/uL — ABNORMAL HIGH (ref 4.0–10.5)
WBC: 14.6 10*3/uL — ABNORMAL HIGH (ref 4.0–10.5)
nRBC: 0.2 % (ref 0.0–0.2)
nRBC: 0.3 % — ABNORMAL HIGH (ref 0.0–0.2)

## 2021-07-19 LAB — HEPARIN LEVEL (UNFRACTIONATED): Heparin Unfractionated: 0.54 IU/mL (ref 0.30–0.70)

## 2021-07-19 LAB — BASIC METABOLIC PANEL
Anion gap: 8 (ref 5–15)
Anion gap: 9 (ref 5–15)
BUN: 43 mg/dL — ABNORMAL HIGH (ref 8–23)
BUN: 41 mg/dL — ABNORMAL HIGH (ref 8–23)
CO2: 26 mmol/L (ref 22–32)
CO2: 26 mmol/L (ref 22–32)
Calcium: 7.7 mg/dL — ABNORMAL LOW (ref 8.9–10.3)
Calcium: 7.8 mg/dL — ABNORMAL LOW (ref 8.9–10.3)
Chloride: 120 mmol/L — ABNORMAL HIGH (ref 98–111)
Chloride: 120 mmol/L — ABNORMAL HIGH (ref 98–111)
Creatinine, Ser: 2.77 mg/dL — ABNORMAL HIGH (ref 0.61–1.24)
Creatinine, Ser: 2.5 mg/dL — ABNORMAL HIGH (ref 0.61–1.24)
GFR, Estimated: 23 mL/min — ABNORMAL LOW (ref >60)
GFR, Estimated: 26 mL/min — ABNORMAL LOW (ref >60)
Glucose, Bld: 145 mg/dL — ABNORMAL HIGH (ref 70–99)
Glucose, Bld: 151 mg/dL — ABNORMAL HIGH (ref 70–99)
Potassium: 3.1 mmol/L — ABNORMAL LOW (ref 3.5–5.1)
Potassium: 3.2 mmol/L — ABNORMAL LOW (ref 3.5–5.1)
Sodium: 154 mmol/L — ABNORMAL HIGH (ref 135–145)
Sodium: 155 mmol/L — ABNORMAL HIGH (ref 135–145)

## 2021-07-19 LAB — GLUCOSE, CAPILLARY
Glucose-Capillary: 145 mg/dL — ABNORMAL HIGH (ref 70–99)
Glucose-Capillary: 132 mg/dL — ABNORMAL HIGH (ref 70–99)
Glucose-Capillary: 148 mg/dL — ABNORMAL HIGH (ref 70–99)
Glucose-Capillary: 140 mg/dL — ABNORMAL HIGH (ref 70–99)

## 2021-07-19 LAB — HEMOGLOBIN AND HEMATOCRIT, BLOOD
HCT: 25.4 % — ABNORMAL LOW (ref 39.0–52.0)
Hemoglobin: 7.8 g/dL — ABNORMAL LOW (ref 13.0–17.0)

## 2021-07-19 LAB — PHOSPHORUS: Phosphorus: 4.8 mg/dL — ABNORMAL HIGH (ref 2.5–4.6)

## 2021-07-19 LAB — MAGNESIUM
Magnesium: 1.8 mg/dL (ref 1.7–2.4)
Magnesium: 1.7 mg/dL (ref 1.7–2.4)

## 2021-07-19 LAB — PREPARE RBC (CROSSMATCH)

## 2021-07-19 MED ORDER — SODIUM CHLORIDE 0.9% IV SOLUTION: Freq: Once | INTRAVENOUS | Status: DC

## 2021-07-19 MED ORDER — POTASSIUM CHLORIDE 10 MEQ/100ML IV SOLN
10.0000 meq | INTRAVENOUS | Status: AC
  Administered 2021-07-19 (×4): 10 meq via INTRAVENOUS
  Filled 2021-07-19 (×4): qty 100

## 2021-07-19 MED ORDER — MAGNESIUM SULFATE 2 GM/50ML IV SOLN
2.0000 g | Freq: Once | INTRAVENOUS | Status: AC
  Administered 2021-07-19: 2 g via INTRAVENOUS
  Filled 2021-07-19: qty 50

## 2021-07-19 NOTE — Progress Notes (Signed)
eLink Physician-Brief Progress Note ?Patient Name: Jason Holt. ?DOB: Apr 25, 1946 ?MRN: [id number] ? ? ?Date of Service ? 07/19/2021  ?HPI/Events of Note ? Hemoglobin 6.8 called by lab. Dated 3/11 on EMR but clarified and this is being updated to 3/12. No external bleeding notified.   ?eICU Interventions ? 1 unit RBC transfusion, watch for fluid overload ?Type/screen and post transfusion H/H ordered ?RN to verify consent ?Is on heparin so will need close monitoring   ? ? ? ?Intervention Category ?Intermediate Interventions: Diagnostic test evaluation ? ?Cornelious Bartolucci G Adrien Shankar ?07/19/2021, 5:52 AM ?

## 2021-07-19 NOTE — Progress Notes (Signed)
Hemoglobin 6.8, CCM notified ?

## 2021-07-19 NOTE — Progress Notes (Signed)
[facility] ROUNDING NOTE   Subjective:   Interval History: This is a 76 year old gentleman who presented 06/29/2021 to [facility] with large saddle pulmonary embolus.  He was initially found to be in renal failure with a creatinine of 2.2 mg/dL.  This seemed to improve.  He underwent CT angiogram 07/10/2021 with the use of 80 cc omnipaque.  He had had some episodes of hypotension.  His creatinine worsened from 07/10/2021. Urinalysis did show some RBCs and proteinuria although this could be consistent with some Foley trauma.  He is requiring intravenous anticoagulation.  His creatinine appears to be improved with improving hemodynamics.  Blood pressure 128/55 pulse 68 temperature 98.1 O2 sats 97%.  IV norepinephrine  Sodium 155 potassium 3.2 chloride 120 CO2 26 BUN 41 creatinine 2.5 glucose 151 calcium 7.8 magnesium 1.7  Urine output unable to measure due to continuous bladder irrigation.  Does appear to be making some urine and creatinine seems to be improving    Objective:  Vital signs in last 24 hours:  Temp:  [97 F (36.1 C)-98.2 F (36.8 C)] 98.1 F (36.7 C) (03/12 0800) Pulse Rate:  [58-75] 71 (03/12 0900) Resp:  [14-31] 24 (03/12 0900) BP: (87-150)/(32-122) 128/91 (03/12 0900) SpO2:  [92 %-99 %] 98 % (03/12 0900) FiO2 (%):  [50 %] 50 % (03/11 1521) Weight:  [96.8 kg] 96.8 kg (03/12 0344)  Weight change: -4.5 kg Filed Weights   07/15/21 0500 07/18/21 0500 07/19/21 0344  Weight: 99.8 kg 101.3 kg 96.8 kg    Intake/Output: I/O last 3 completed shifts: In: 10327.7 [P.O.:650; I.V.:3467.7; Other:6000; NG/GT:110; IV Piggyback:100] Out: 45600 [Urine:45600]   Intake/Output this shift:  Total I/O In: 243.3 [I.V.:213.3; Blood:30] Out: 10010 [Urine:10010]  CVS- RRR JVP not distended RS- CTA no wheezes or rales ABD- BS present soft non-distended EXT-mild lower extremity swelling   Basic Metabolic Panel: Recent Labs  Lab 07/14/21 0318 07/15/21 0554  07/17/21 0527 07/17/21 1613 07/18/21 0201 07/18/21 0345 07/19/21 0708  NA 144   < > 142 143 148* 154* 155*  K 4.2   < > 4.6 4.3 4.6 3.1* 3.2*  CL 113*   < > 111 110 114* 120* 120*  CO2 18*   < > 18* 18* 21* 26 26  GLUCOSE 104*   < > 101* 97 143* 145* 151*  BUN 48*   < > 65* 68* 64* 43* 41*  CREATININE 2.97*   < > 5.01* 4.93* 4.54* 2.77* 2.50*  CALCIUM 7.5*   < > 8.0* 8.1* 8.2* 7.7* 7.8*  MG 1.7  --   --  2.1 2.1 1.8 1.7  PHOS 6.6*  --   --  8.1* 7.9* 4.8*  --    < > = values in this interval not displayed.     Liver Function Tests: Recent Labs  Lab 07/13/21 0350  AST 14*  ALT 15  ALKPHOS 112  BILITOT 0.4  PROT 5.4*  ALBUMIN 2.3*    No results for input(s): LIPASE, AMYLASE in the last 168 hours. No results for input(s): AMMONIA in the last 168 hours.  CBC: Recent Labs  Lab 07/13/21 0350 07/14/21 0318 07/16/21 0446 07/16/21 1159 07/17/21 0527 07/17/21 1613 07/18/21 0201 07/18/21 0345  WBC 13.4*   < > 14.4*  --  12.5* 15.0* 17.2* 14.2*  NEUTROABS 10.1*  --   --   --   --   --   --   --   HGB 9.5*   < > 9.4*  8.5* 7.8* 8.4* 8.5* 6.8*  HCT 31.7*   < > 30.3* 25.0* 24.6* 26.5* 27.9* 22.2*  MCV 88.5   < > 87.6  --  87.5 87.2 89.1 88.4  PLT 236   < > 224  --  216 263 283 297   < > = values in this interval not displayed.     Cardiac Enzymes: Recent Labs  Lab 07/14/21 1300  CKTOTAL 51     BNP: Invalid input(s): POCBNP  CBG: Recent Labs  Lab 07/18/21 1509 07/18/21 1928 07/18/21 2313 07/19/21 0352 07/19/21 0724  GLUCAP 124* 137* 141* 145* 132*     Microbiology: Results for orders placed or performed during the hospital encounter of 06/10/2021  Resp Panel by RT-PCR (Flu A&B, Covid) Nasopharyngeal Swab     Status: None   Collection Time: 06/30/2021  9:28 AM   Specimen: Nasopharyngeal Swab; Nasopharyngeal(NP) swabs in vial transport medium  Result Value Ref Range Status   SARS Coronavirus 2 by RT PCR NEGATIVE NEGATIVE Final    Comment:  (NOTE) SARS-CoV-2 target nucleic acids are NOT DETECTED.  The SARS-CoV-2 RNA is generally detectable in upper respiratory specimens during the acute phase of infection. The lowest concentration of SARS-CoV-2 viral copies this assay can detect is 138 copies/mL. A negative result does not preclude SARS-Cov-2 infection and should not be used as the sole basis for treatment or other patient management decisions. A negative result may occur with  improper specimen collection/handling, submission of specimen other than nasopharyngeal swab, presence of viral mutation(s) within the areas targeted by this assay, and inadequate number of viral copies(<138 copies/mL). A negative result must be combined with clinical observations, patient history, and epidemiological information. The expected result is Negative.  Fact Sheet for Patients:  EntrepreneurPulse.com.au  Fact Sheet for Healthcare Providers:  IncredibleEmployment.be  This test is no t yet approved or cleared by the [place] FDA and  has been authorized for detection and/or diagnosis of SARS-CoV-2 by FDA under an Emergency Use Authorization (EUA). This EUA will remain  in effect (meaning this test can be used) for the duration of the COVID-19 declaration under Section 564(b)(1) of the Act, 21 U.S.C.section 360bbb-3(b)(1), unless the authorization is terminated  or revoked sooner.       Influenza A by PCR NEGATIVE NEGATIVE Final   Influenza B by PCR NEGATIVE NEGATIVE Final    Comment: (NOTE) The Xpert Xpress SARS-CoV-2/FLU/RSV plus assay is intended as an aid in the diagnosis of influenza from Nasopharyngeal swab specimens and should not be used as a sole basis for treatment. Nasal washings and aspirates are unacceptable for Xpert Xpress SARS-CoV-2/FLU/RSV testing.  Fact Sheet for Patients: EntrepreneurPulse.com.au  Fact Sheet for Healthcare  Providers: IncredibleEmployment.be  This test is not yet approved or cleared by the [place] FDA and has been authorized for detection and/or diagnosis of SARS-CoV-2 by FDA under an Emergency Use Authorization (EUA). This EUA will remain in effect (meaning this test can be used) for the duration of the COVID-19 declaration under Section 564(b)(1) of the Act, 21 U.S.C. section 360bbb-3(b)(1), unless the authorization is terminated or revoked.  Performed at [facility], [address] [place], [place] [address]   Blood Culture (routine x 2)     Status: None   Collection Time: 07/05/2021  9:31 AM   Specimen: BLOOD  Result Value Ref Range Status   Specimen Description BLOOD BLOOD LEFT ARM  Final   Special Requests   Final    BOTTLES DRAWN AEROBIC  AND ANAEROBIC Blood Culture adequate volume   Culture   Final    NO GROWTH 6 DAYS Performed at [facility], [address] [place], [place] [address]    Report Status 07/13/2021 FINAL  Final  Blood Culture (routine x 2)     Status: None   Collection Time: 06/29/2021  9:51 AM   Specimen: BLOOD  Result Value Ref Range Status   Specimen Description BLOOD RIGHT ANTECUBITAL  Final   Special Requests   Final    BOTTLES DRAWN AEROBIC AND ANAEROBIC Blood Culture adequate volume   Culture   Final    NO GROWTH 6 DAYS Performed at [facility], [address], [place], [place] [address]    Report Status 07/13/2021 FINAL  Final  MRSA Next Gen by PCR, Nasal     Status: None   Collection Time: 06/17/2021  1:11 PM   Specimen: Nasal Mucosa; Nasal Swab  Result Value Ref Range Status   MRSA by PCR Next Gen NOT DETECTED NOT DETECTED Final    Comment: (NOTE) The GeneXpert MRSA Assay (FDA approved for NASAL specimens only), is one component of a comprehensive MRSA colonization surveillance program. It is not intended to diagnose MRSA infection nor to guide or monitor treatment for MRSA infections. Test performance is not FDA approved  in patients less than 46 years old. Performed at [facility], [address], [place], [place] [address]   MRSA Next Gen by PCR, Nasal     Status: None   Collection Time: 07/15/21  4:48 PM   Specimen: Nasal Mucosa; Nasal Swab  Result Value Ref Range Status   MRSA by PCR Next Gen NOT DETECTED NOT DETECTED Final    Comment: (NOTE) The GeneXpert MRSA Assay (FDA approved for NASAL specimens only), is one component of a comprehensive MRSA colonization surveillance program. It is not intended to diagnose MRSA infection nor to guide or monitor treatment for MRSA infections. Test performance is not FDA approved in patients less than 69 years old. Performed at [facility], [place] [address] [place], [place] [address]     Coagulation Studies: No results for input(s): LABPROT, INR in the last 72 hours.   Urinalysis: No results for input(s): COLORURINE, LABSPEC, PHURINE, GLUCOSEU, HGBUR, BILIRUBINUR, KETONESUR, PROTEINUR, UROBILINOGEN, NITRITE, LEUKOCYTESUR in the last 72 hours.  Invalid input(s): APPERANCEUR    Imaging: US RENAL  Result Date: 07/17/2021 CLINICAL DATA:  Acute renal failure. History of exploratory laparotomy 02/18/2011. EXAM: RENAL / URINARY TRACT ULTRASOUND COMPLETE COMPARISON:  Abdomen pelvis 08/30/2016. FINDINGS: Limited abdominal ultrasound due to body habitus and bowel gas. Right Kidney: Renal measurements: 10.1 x 5.6 x 5.8 cm = volume: 173 mL. Echogenicity within normal limits. Likely cystic 2.9 x 1.9 x 2.7 cm right renal lesion along the inferior pole. Hypoechoic 2.9 x 2.4 x 3.2 hypoechoic lesion within the right kidney with associated posterior acoustic shadowing. No definite associated intralesional vascularity. No hydronephrosis visualized. Left Kidney: Renal measurements: 10.7 x 6 x 6.1 cm = volume: 205 mL. Echogenicity within normal limits. No mass or hydronephrosis visualized. Urinary bladder: There is a 4.3 x 2.6 x 3.5 cm hypoechoic lesion along the urinary  bladder lumen. Other: None. IMPRESSION: 1. Limited abdominal ultrasound due to body habitus and bowel gas. 2. A 4.3 x 2.6 x 3.5 cm hypoechoic lesion along the urinary bladder lumen. Finding could represent a blood clot versus mass. Recommend direct visualization. 3. Query solid 2.9 x 2.4 x 2.2 cm right renal lesion. Consider MRI  renal protocol for further evaluation. 4. Likely cystic 2.9 x 1.9 x 2.7 cm right renal lesion along the inferior pole. Electronically Signed   By: Iven Finn M.D.   On: 07/17/2021 17:30   DG Chest Port 1 View  Result Date: 07/18/2021 CLINICAL DATA:  Acute respiratory distress.  Pulmonary embolism. EXAM: PORTABLE CHEST 1 VIEW COMPARISON:  07/17/2021 FINDINGS: Left subclavian central venous catheter remains in appropriate position. Low lung volumes again noted. Heart size is within normal limits. Moderate diffuse bilateral pulmonary airspace disease shows no significant change. Probable tiny right pleural effusion is also stable. IMPRESSION: Stable diffuse bilateral pulmonary airspace disease and probable tiny right pleural effusion. Electronically Signed   By: Marlaine Hind M.D.   On: 07/18/2021 11:53   DG Chest Port 1 View  Result Date: 07/17/2021 CLINICAL DATA:  Central line placement EXAM: PORTABLE CHEST 1 VIEW COMPARISON:  07/17/2021 at 1130 hours FINDINGS: Interval placement of left subclavian approach central venous catheter with distal tip terminating at the level of the distal SVC. Stable heart size. Aortic atherosclerosis. Low lung volumes. Extensive patchy airspace opacities throughout both lungs. Probable small bilateral pleural effusions. No pneumothorax. IMPRESSION: Interval placement of left subclavian approach central venous catheter with distal tip terminating at the level of the distal SVC. No pneumothorax. Electronically Signed   By: Davina Poke D.O.   On: 07/17/2021 13:04   DG CHEST PORT 1 VIEW  Result Date: 07/17/2021 CLINICAL DATA:  76 year old male  with history of failed left internal jugular catheter placement attempt. EXAM: PORTABLE CHEST 1 VIEW COMPARISON:  Chest x-ray 07/17/2021. FINDINGS: Widespread but patchy airspace consolidation and extensive interstitial prominence noted throughout all aspects of the lungs bilaterally, with worsened aeration compared to the prior study. Small bilateral pleural effusions. Pulmonary vasculature is obscured. No pneumothorax. Heart size is mildly enlarged. Mediastinal contours are distorted by patient positioning. IMPRESSION: 1. Worsening severe multilobar bilateral pneumonia with small bilateral parapneumonic pleural effusions. 2. Mild cardiomegaly. Electronically Signed   By: Vinnie Langton M.D.   On: 07/17/2021 11:41   DG Abd Portable 1V  Result Date: 07/17/2021 CLINICAL DATA:  Feeding tube placement EXAM: PORTABLE ABDOMEN - 1 VIEW COMPARISON:  CT 08/30/2016. FINDINGS: Single supine view of the upper abdomen. Feeding tube terminates at the distal stomach and may be entering the pylorus. Exam is otherwise limited by patient body habitus and technique. No gross free intraperitoneal air. Suspect gas-filled small bowel loops within the upper abdomen. The low pelvis and right-side of the abdomen are excluded. IMPRESSION: Feeding tube terminating at the antral/pyloric region. Electronically Signed   By: Abigail Miyamoto M.D.   On: 07/17/2021 15:24     Medications:    sodium chloride Stopped (07/12/21 2034)   ceFEPime (MAXIPIME) IV Stopped (07/18/21 1718)   dexmedetomidine (PRECEDEX) IV infusion 0.6 mcg/kg/hr (07/19/21 0900)   dextrose 75 mL/hr at 07/19/21 0900   feeding supplement (VITAL 1.5 CAL) Stopped (07/17/21 2100)   heparin 1,750 Units/hr (07/19/21 0900)   norepinephrine (LEVOPHED) Adult infusion 2 mcg/min (07/19/21 0900)   sodium chloride irrigation     sodium chloride irrigation      sodium chloride   Intravenous Once   buprenorphine  1 patch Transdermal Weekly   Chlorhexidine Gluconate Cloth  6  each Topical Q0600   feeding supplement (PROSource TF)  45 mL Per Tube BID   [START ON 07/20/2021] finasteride  5 mg Oral Daily   lidocaine  1 application. Urethral Once   mouth rinse  15  mL Mouth Rinse BID   pantoprazole (PROTONIX) IV  40 mg Intravenous Q24H   sodium chloride, acetaminophen **OR** acetaminophen, hydrALAZINE, ipratropium-albuterol, [DISCONTINUED] ondansetron **OR** ondansetron (ZOFRAN) IV  Assessment/ Plan:  Acute kidney injury.  This appears to be temporally related to the use of intravenous contrast and hypotension.  I believe this is the etiology of his worsening renal failure.  He continues to urinate and is nonoliguric.  Renal ultrasound is pending.  Urine microscopy is consistent with Foley trauma.  I doubt this represents an acute glomerulonephritis.  We discussed dialysis.  Discussed with patient's daughter.  Renal function does appear to be slightly improved. ANEMIA-stable at this time. MBD-mild hyperphosphatemia CPK normal range HTN/VOL-volume status slightly hypervolemic.  We will continue to follow and no IV Lasix at this point.  Blood pressures appear to be marginal.  No antihypertensives on hold Pulmonary embolus saddle embolus with hypotension hemodynamic compromise.  Continues to be hypotensive prognosis may be poor. Hypernatremia we will increase the rate of free water repletion. Hypokalemia will replete      LOS: [place] '@TODAY''@9'$ :41 AM

## 2021-07-19 NOTE — Progress Notes (Addendum)
ANTICOAGULATION CONSULT NOTE ? ?Pharmacy Consult for heparin ?Indication: pulmonary embolus ? ?Allergies  ?Allergen Reactions  ? Influenza Virus Vacc Split Pf Hives  ? Dye Fdc Red [Red Dye]   ? ? ?Patient Measurements: ?Height: '5\' 5"'$  (165.1 cm) ?Weight: 96.8 kg (213 lb 6.5 oz) ?IBW/kg (Calculated) : 61.5 ?HEPARIN DW (KG): 82.1  ? ?Labs: ?Recent Labs  ?  07/17/21 ?1613 07/18/21 ?0201 07/18/21 ?0345 07/19/21 ?0345 07/19/21 ?0708  ?HGB 8.4* 8.5* 6.8*  --   --   ?HCT 26.5* 27.9* 22.2*  --   --   ?PLT 263 283 297  --   --   ?HEPARINUNFRC 0.32 0.43  --  0.54  --   ?CREATININE 4.93* 4.54* 2.77*  --  2.50*  ? ? ? ?Estimated Creatinine Clearance: 26.5 mL/min (A) (by C-G formula based on SCr of 2.5 mg/dL (H)). ? ? ?Assessment: ?61 YOM admitted for SOB, found to have acute PE with evidence of RHS (RV/LV ratio = 1.3) consistent with at least submassive PE. Patient transferred from [facility] to [facility] for possible IR intervention vs. thrombolytic. Patient is s/p systemic tPA on 3/5 and heparin was resumed.  ? ?Heparin level slightly high at 0.54 units/mL.  New hematuria with clots on 3/11 and Urology irrigating bladder.  Hematuria improving and no further clots.  Hemoglobin trended down and patient is being transfused. ? ?Goal of Therapy:  ?Heparin level 0.3-0.5 units/mL with new hematuria ?Monitor platelets by anticoagulation protocol: Yes ?  ?Plan:  ?Reduce heparin infusion slightly to 1700 units/hr  ?Daily heparin level and CB ?Monitor for hematuria resolution ? ?Ayushi Pla D. Mina Marble, PharmD, BCPS, BCCCP ?07/19/2021, 10:09 AM ? ? ?

## 2021-07-19 NOTE — Progress Notes (Signed)
? ?NAME:  Jason Holt., MRN:  [id number], DOB:  March 21, 1946, LOS: 12 ?ADMISSION DATE:  06/24/2021, CONSULTATION DATE:  2/28 ?REFERRING MD:  Wynetta Emery, CHIEF COMPLAINT:  Dyspnea  ? ?History of Present Illness:  ?76 yo male presented to [facility] with dyspnea for 3 days.  EMS called on 2/27 and he had SpO2 in the 80's on room air.  He declined option to come to ER then.  Symptoms got worse and he came to ER.  SpO2 in 70's on room air.  Found to have Rt sided pneumonia.  Started on IV fluids and antibiotics for sepsis, and Bipap with oxygen for respiratory failure.  Had persistent hypoxia and had CT angiogram showed acute saddle PE.  Transferred to [facility] for further management. ? ?Pertinent  Medical History  ?HTN, Depression, GERD ? ?Significant Hospital Events: ?Including procedures, antibiotic start and stop dates in addition to other pertinent events   ?2/28 admission, treatment for pneumonia with ceftriaxone/azithro, elevated troponin, treated with BIPAP ?3/1 TTE> LVEF 55-60%, mild LVH, D shaped septum, evidence of RV overload ?3/3 found to have saddle PE on CT angio, patchy ggo bilaterally; negative lower ext doppler, started on heparin ?3/4 moved to [facility] for persistent hypoxemia, remains on HHF ?3/5 no change in oxygen needs, given full dose TPA ?3/6 weaned O2 needs, tolerating diet. ?3/7 nephro consulted d/t rising cr. They felt mixed picture of Hypotension and Contrast dye injury.  Received Lasix IV. ?3/8 still on 50% heated high flow. Renal fxn worse.  Added cefepime.  For possible hcap, MRSA PCR again negative.  Bicarbonate supplementation started ?3/9: Renal function continues to worsen.  Urine output dropping.  Increasing bicarbonate, adding back gentle IV hydration.  No improvement in oxygen requirements ?3/10 on Precedex and BIPAP. BP boarderline hypotension, bradycardia all 2/2 dex. Increased edema on CXR. Place CVL for co-ox monitoring and CVP, creatinine hitting plateau and UOP picking up  ? ?Interim  History / Subjective:  ?On Bipap overnight.  Remains on precedex, pressors. ? ?Objective   ?Blood pressure (!) 128/91, pulse 71, temperature 98.1 ?F (36.7 ?C), temperature source Axillary, resp. rate (!) 24, height '5\' 5"'$  (1.651 m), weight 96.8 kg, SpO2 98 %. ?CVP:  [7 mmHg-10 mmHg] 7 mmHg  ?FiO2 (%):  [50 %] 50 %  ? ?Intake/Output Summary (Last 24 hours) at 07/19/2021 0946 ?Last data filed at 07/19/2021 0930 ?Gross per 24 hour  ?Intake 2693.99 ml  ?Output 34910 ml  ?Net -32216.01 ml  ? ?Filed Weights  ? 07/15/21 0500 07/18/21 0500 07/19/21 0344  ?Weight: 99.8 kg 101.3 kg 96.8 kg  ? ? ?Examination: ? ?General - somnolent ?Eyes - pupils reactive ?ENT - Bipap mask on ?Cardiac - regular rate/rhythm, no murmur ?Chest - b/l rhonchi ?Abdomen - soft, non tender, + bowel sounds ?Extremities - 1+ edema ?Skin - areas of ecchymosis ?Neuro - intermittently agitated ? ?Resolved Hospital Problem list   ?Lactic acidosis, Elevated trop d/t right heart strain  ? ?Assessment & Plan:  ? ?Acute respiratory failure with hypoxemia 2/2 Saddle PE, submassive with RV strain complicated by hcap(NOS) and now element of what looks like pulmonary edema. ?- goal SpO2 > 92% ?- Bipap qhs and prn ?- completed ABx 3/11 ?- f/u CXR  ? ?Drug induced Hypotension 2/2 precedex. ?- pressors as needed to keep MAP > 65 ? ?AKI from contrast and post obstructive with bladder mass. ?- nephrology consulted ?- D5W increased to 150 ml/hr ?- holding off on dialysis for now ?- monitor urine  outpt ? ?Acute urine retention, scrotal/penile edema, bladder mass, Rt renal mass, hematuria. ?- urology consulted and placed foley ?- bladder irrigation per urology ? ?Acute metabolic encephalopathy from uremia. ?Chronic pain. ?- RASS goal 0 ?- continue buprenorphine ? ?Anemia of critical illness. ?- f/u CBC ?- transfuse for Hb < 7 ? ?Best Practice (right click and "Reselect all SmartList Selections" daily)  ? ?Diet/type: Regular consistency (see orders) and NPO ?DVT prophylaxis:  systemic heparin ?GI prophylaxis: N/A ?Lines: N/A ?Foley:  N/A ?Code Status:  full code ?Last date of multidisciplinary goals of care discussion [several were held on 3/4.  See documentation] ?As of 3/10 remains critically ill w/ what seems like poor prognosis at best. He is debilitated, oxygen requirements still significant. I am concerned that he may improve some but not have the reserves to recover ? ?Labs:  ? ?CMP Latest Ref Rng & Units 07/19/2021 07/18/2021 07/18/2021  ?Glucose 70 - 99 mg/dL 151(H) 145(H) 143(H)  ?BUN 8 - 23 mg/dL 41(H) 43(H) 64(H)  ?Creatinine 0.61 - 1.24 mg/dL 2.50(H) 2.77(H) 4.54(H)  ?Sodium 135 - 145 mmol/L 155(H) 154(H) 148(H)  ?Potassium 3.5 - 5.1 mmol/L 3.2(L) 3.1(L) 4.6  ?Chloride 98 - 111 mmol/L 120(H) 120(H) 114(H)  ?CO2 22 - 32 mmol/L 26 26 21(L)  ?Calcium 8.9 - 10.3 mg/dL 7.8(L) 7.7(L) 8.2(L)  ?Total Protein 6.5 - 8.1 g/dL - - -  ?Total Bilirubin 0.3 - 1.2 mg/dL - - -  ?Alkaline Phos 38 - 126 U/L - - -  ?AST 15 - 41 U/L - - -  ?ALT 0 - 44 U/L - - -  ? ? ?CBC Latest Ref Rng & Units 07/18/2021 07/18/2021 07/17/2021  ?WBC 4.0 - 10.5 K/uL 14.2(H) 17.2(H) 15.0(H)  ?Hemoglobin 13.0 - 17.0 g/dL 6.8(LL) 8.5(L) 8.4(L)  ?Hematocrit 39.0 - 52.0 % 22.2(L) 27.9(L) 26.5(L)  ?Platelets 150 - 400 K/uL 297 283 263  ? ? ?ABG ?   ?Component Value Date/Time  ? PHART 7.268 (L) 07/16/2021 1159  ? PCO2ART 44.0 07/16/2021 1159  ? PO2ART 65 (L) 07/16/2021 1159  ? HCO3 20.0 07/16/2021 1159  ? TCO2 21 (L) 07/16/2021 1159  ? ACIDBASEDEF 6.0 (H) 07/16/2021 1159  ? O2SAT 78.3 07/17/2021 1257  ? ? ?CBG (last 3)  ?Recent Labs  ?  07/18/21 ?2313 07/19/21 ?0301 07/19/21 ?0724  ?GLUCAP 141* 145* 132*  ? ? ?Critical care time: 35 minutes  ?Chesley Mires, MD ?[place] ?Pager - [phone] ?07/19/2021, 9:46 AM ? ? ? ? ? ? ?

## 2021-07-20 ENCOUNTER — Inpatient Hospital Stay (HOSPITAL_COMMUNITY): Payer: No Typology Code available for payment source

## 2021-07-20 DIAGNOSIS — I2602 Saddle embolus of pulmonary artery with acute cor pulmonale: Secondary | ICD-10-CM | POA: Diagnosis not present

## 2021-07-20 DIAGNOSIS — J189 Pneumonia, unspecified organism: Secondary | ICD-10-CM | POA: Diagnosis not present

## 2021-07-20 DIAGNOSIS — J9601 Acute respiratory failure with hypoxia: Secondary | ICD-10-CM | POA: Diagnosis not present

## 2021-07-20 DIAGNOSIS — N179 Acute kidney failure, unspecified: Secondary | ICD-10-CM | POA: Diagnosis not present

## 2021-07-20 DIAGNOSIS — J810 Acute pulmonary edema: Secondary | ICD-10-CM

## 2021-07-20 DIAGNOSIS — G9341 Metabolic encephalopathy: Secondary | ICD-10-CM | POA: Diagnosis not present

## 2021-07-20 DIAGNOSIS — I952 Hypotension due to drugs: Secondary | ICD-10-CM

## 2021-07-20 LAB — BASIC METABOLIC PANEL
Anion gap: 9 (ref 5–15)
Anion gap: 5 (ref 5–15)
BUN: 22 mg/dL (ref 8–23)
BUN: 18 mg/dL (ref 8–23)
CO2: 30 mmol/L (ref 22–32)
CO2: 28 mmol/L (ref 22–32)
Calcium: 7.5 mg/dL — ABNORMAL LOW (ref 8.9–10.3)
Calcium: 7.4 mg/dL — ABNORMAL LOW (ref 8.9–10.3)
Chloride: 117 mmol/L — ABNORMAL HIGH (ref 98–111)
Chloride: 119 mmol/L — ABNORMAL HIGH (ref 98–111)
Creatinine, Ser: 1.54 mg/dL — ABNORMAL HIGH (ref 0.61–1.24)
Creatinine, Ser: 1.22 mg/dL (ref 0.61–1.24)
GFR, Estimated: 46 mL/min — ABNORMAL LOW (ref >60)
GFR, Estimated: >60 mL/min (ref >60)
Glucose, Bld: 156 mg/dL — ABNORMAL HIGH (ref 70–99)
Glucose, Bld: 171 mg/dL — ABNORMAL HIGH (ref 70–99)
Potassium: 2.8 mmol/L — ABNORMAL LOW (ref 3.5–5.1)
Potassium: 4.3 mmol/L (ref 3.5–5.1)
Sodium: 156 mmol/L — ABNORMAL HIGH (ref 135–145)
Sodium: 152 mmol/L — ABNORMAL HIGH (ref 135–145)

## 2021-07-20 LAB — CBC
HCT: 24.9 % — ABNORMAL LOW (ref 39.0–52.0)
HCT: 24.5 % — ABNORMAL LOW (ref 39.0–52.0)
Hemoglobin: 7.9 g/dL — ABNORMAL LOW (ref 13.0–17.0)
Hemoglobin: 7.3 g/dL — ABNORMAL LOW (ref 13.0–17.0)
MCH: 28.3 pg (ref 26.0–34.0)
MCH: 27.5 pg (ref 26.0–34.0)
MCHC: 31.7 g/dL (ref 30.0–36.0)
MCHC: 29.8 g/dL — ABNORMAL LOW (ref 30.0–36.0)
MCV: 89.2 fL (ref 80.0–100.0)
MCV: 92.5 fL (ref 80.0–100.0)
Platelets: 293 10*3/uL (ref 150–400)
Platelets: 299 10*3/uL (ref 150–400)
RBC: 2.79 MIL/uL — ABNORMAL LOW (ref 4.22–5.81)
RBC: 2.65 MIL/uL — ABNORMAL LOW (ref 4.22–5.81)
RDW: 15.5 % (ref 11.5–15.5)
RDW: 16 % — ABNORMAL HIGH (ref 11.5–15.5)
WBC: 11.8 10*3/uL — ABNORMAL HIGH (ref 4.0–10.5)
WBC: 13.1 10*3/uL — ABNORMAL HIGH (ref 4.0–10.5)
nRBC: 0.4 % — ABNORMAL HIGH (ref 0.0–0.2)
nRBC: 0.3 % — ABNORMAL HIGH (ref 0.0–0.2)

## 2021-07-20 LAB — GLUCOSE, CAPILLARY
Glucose-Capillary: 153 mg/dL — ABNORMAL HIGH (ref 70–99)
Glucose-Capillary: 154 mg/dL — ABNORMAL HIGH (ref 70–99)
Glucose-Capillary: 155 mg/dL — ABNORMAL HIGH (ref 70–99)
Glucose-Capillary: 158 mg/dL — ABNORMAL HIGH (ref 70–99)
Glucose-Capillary: 166 mg/dL — ABNORMAL HIGH (ref 70–99)

## 2021-07-20 LAB — MAGNESIUM: Magnesium: 1.6 mg/dL — ABNORMAL LOW (ref 1.7–2.4)

## 2021-07-20 LAB — HEPARIN LEVEL (UNFRACTIONATED)
Heparin Unfractionated: 0.7 IU/mL (ref 0.30–0.70)
Heparin Unfractionated: 0.72 IU/mL — ABNORMAL HIGH (ref 0.30–0.70)

## 2021-07-20 MED ORDER — FREE WATER
200.0000 mL | Status: DC
  Administered 2021-07-20: 200 mL

## 2021-07-20 MED ORDER — MAGNESIUM SULFATE 4 GM/100ML IV SOLN
4.0000 g | Freq: Once | INTRAVENOUS | Status: AC
  Administered 2021-07-20: 4 g via INTRAVENOUS
  Filled 2021-07-20: qty 100

## 2021-07-20 MED ORDER — QUETIAPINE FUMARATE 25 MG PO TABS
50.0000 mg | ORAL_TABLET | Freq: Every day | ORAL | Status: DC
  Administered 2021-07-20 – 2021-07-23 (×4): 50 mg
  Filled 2021-07-20 (×4): qty 2

## 2021-07-20 MED ORDER — POTASSIUM CHLORIDE 20 MEQ PO PACK
40.0000 meq | PACK | Freq: Once | ORAL | Status: AC
  Administered 2021-07-20: 40 meq via ORAL
  Filled 2021-07-20: qty 2

## 2021-07-20 MED ORDER — POTASSIUM CHLORIDE CRYS ER 20 MEQ PO TBCR: 40.0000 meq | EXTENDED_RELEASE_TABLET | Freq: Once | ORAL | Status: DC

## 2021-07-20 MED ORDER — ACETAMINOPHEN 650 MG RE SUPP
650.0000 mg | Freq: Four times a day (QID) | RECTAL | Status: DC | PRN
Start: 2021-07-20 — End: 2021-07-24

## 2021-07-20 MED ORDER — POTASSIUM CHLORIDE 10 MEQ/50ML IV SOLN
10.0000 meq | INTRAVENOUS | Status: AC
  Administered 2021-07-20 (×5): 10 meq via INTRAVENOUS
  Filled 2021-07-20 (×5): qty 50

## 2021-07-20 MED ORDER — LACTULOSE 10 GM/15ML PO SOLN
20.0000 g | Freq: Three times a day (TID) | ORAL | Status: DC
  Administered 2021-07-20: 20 g
  Filled 2021-07-20: qty 30

## 2021-07-20 MED ORDER — OXYCODONE HCL 5 MG PO TABS
5.0000 mg | ORAL_TABLET | ORAL | Status: DC | PRN
  Administered 2021-07-20 – 2021-07-21 (×3): 5 mg via ORAL
  Filled 2021-07-20 (×3): qty 1

## 2021-07-20 MED ORDER — FREE WATER
400.0000 mL | Status: DC
  Administered 2021-07-20 – 2021-07-23 (×16): 400 mL

## 2021-07-20 MED ORDER — POTASSIUM CHLORIDE 10 MEQ/50ML IV SOLN
10.0000 meq | INTRAVENOUS | Status: AC
  Administered 2021-07-20 (×4): 10 meq via INTRAVENOUS
  Filled 2021-07-20 (×4): qty 50

## 2021-07-20 MED ORDER — ACETAMINOPHEN 325 MG PO TABS
650.0000 mg | ORAL_TABLET | Freq: Four times a day (QID) | ORAL | Status: DC | PRN
Start: 2021-07-20 — End: 2021-07-24
  Administered 2021-07-20 – 2021-07-23 (×4): 650 mg
  Filled 2021-07-20 (×4): qty 2

## 2021-07-20 NOTE — Progress Notes (Signed)
PT Cancellation Note ? ?Patient Details ?Name: Jason Holt. ?MRN: [id number] ?DOB: 21-Feb-1946 ? ? ?Cancelled Treatment:    Reason Eval/Treat Not Completed: Medical issues which prohibited therapy; high O2 requirement and desats with any movement, now off Levo and BP dropping.  Will attempt again another day. ? ? ?Reginia Naas ?07/20/2021, 12:05 PM ?Magda Kiel, PT ?Acute Rehabilitation Services ?YOOJZ:[phone] ?Office:[phone] ?07/20/2021 ? ?

## 2021-07-20 NOTE — Progress Notes (Signed)
ANTICOAGULATION CONSULT NOTE ? ?Pharmacy Consult for heparin ?Indication: pulmonary embolus ? ?Allergies  ?Allergen Reactions  ? Influenza Virus Vacc Split Pf Hives  ? Dye Fdc Red [Red Dye]   ? ? ?Patient Measurements: ?Height: '5\' 5"'$  (165.1 cm) ?Weight: 96.8 kg (213 lb 6.5 oz) ?IBW/kg (Calculated) : 61.5 ?HEPARIN DW (KG): 82.9  ? ?Labs: ?Recent Labs  ?  07/18/21 ?0201 07/18/21 ?0345 07/19/21 ?0345 07/19/21 ?0708 07/19/21 ?1217 07/19/21 ?1809 07/20/21 ?6767 07/20/21 ?0601  ?HGB 8.5* 6.8*  --   --  7.8* 8.4*  --  7.9*  ?HCT 27.9* 22.2*  --   --  25.4* 26.9*  --  24.9*  ?PLT 283 297  --   --   --  334  --  293  ?HEPARINUNFRC 0.43  --  0.54  --   --   --  0.70  --   ?CREATININE 4.54* 2.77*  --  2.50*  --   --  1.54*  --   ? ? ? ?Estimated Creatinine Clearance: 43 mL/min (A) (by C-G formula based on SCr of 1.54 mg/dL (H)). ? ? ?Assessment: ?7 YOM admitted for SOB, found to have acute PE with evidence of RHS (RV/LV ratio = 1.3) consistent with at least submassive PE. Patient transferred from [facility] to [facility] for possible IR intervention vs. thrombolytic. Patient is s/p systemic tPA on 3/5 and heparin was resumed.  ? ?Heparin level remains high and trended up to 0.7 units/mL after slight rate increase yesterday. New hematuria with clots noted on 3/11, and Urology irrigating bladder. Hematuria improving, noo further clots per RN. Hemoglobin trended down, s/p transfusion 3/12, stable today. Plt WNL. No issues with infusion per discussion with RN. ? ?Goal of Therapy:  ?Heparin level 0.3-0.5 units/mL with new hematuria ?Monitor platelets by anticoagulation protocol: Yes ?  ?Plan:  ?Reduce heparin infusion to 1600 units/hr ?Check 6hr heparin level ?Monitor daily CBC, s/sx bleeding ?Monitor for hematuria resolution ? ? ?Arturo Morton, PharmD, BCPS ?Please check AMION for all [facility] contact numbers ?Clinical Pharmacist ?07/20/2021 8:08 AM ?

## 2021-07-20 NOTE — Progress Notes (Signed)
eLink Physician-Brief Progress Note ?Patient Name: Jason Holt. ?DOB: 1945-10-09 ?MRN: [id number] ? ? ?Date of Service ? 07/20/2021  ?HPI/Events of Note ? Patient complaining of some back pain that's not relieved by Tylenol.  ?eICU Interventions ? Oxycodone 5 mg via feeding tube Q 4 hours  PRN ordered.  ? ? ? ?  ? ?Frederik Pear ?07/20/2021, 8:41 PM ?

## 2021-07-20 NOTE — Progress Notes (Signed)
? ?NAME:  Jason Holt., MRN:  [id number], DOB:  1946-02-25, LOS: 13 ?ADMISSION DATE:  07/03/2021, CONSULTATION DATE:  2/28 ?REFERRING MD:  Wynetta Emery, CHIEF COMPLAINT:  Dyspnea  ? ?History of Present Illness:  ?76 yo male presented to [facility] with dyspnea for 3 days.  EMS called on 2/27 and he had SpO2 in the 80's on room air.  He declined option to come to ER then.  Symptoms got worse and he came to ER.  SpO2 in 70's on room air.  Found to have Rt sided pneumonia.  Started on IV fluids and antibiotics for sepsis, and Bipap with oxygen for respiratory failure.  Had persistent hypoxia and had CT angiogram showed acute saddle PE.  Transferred to [facility] for further management. ? ?Pertinent  Medical History  ?HTN, Depression, GERD ? ?Significant Hospital Events: ?Including procedures, antibiotic start and stop dates in addition to other pertinent events   ?2/28 admission, treatment for pneumonia with ceftriaxone/azithro, elevated troponin, treated with BIPAP ?3/1 TTE> LVEF 55-60%, mild LVH, D shaped septum, evidence of RV overload ?3/3 found to have saddle PE on CT angio, patchy ggo bilaterally; negative lower ext doppler, started on heparin ?3/4 moved to [facility] for persistent hypoxemia, remains on HHF ?3/5 no change in oxygen needs, given full dose TPA ?3/6 weaned O2 needs, tolerating diet. ?3/7 nephro consulted d/t rising cr. They felt mixed picture of Hypotension and Contrast dye injury.  Received Lasix IV. ?3/8 still on 50% heated high flow. Renal fxn worse.  Added cefepime.  For possible hcap, MRSA PCR again negative.  Bicarbonate supplementation started ?3/9: Renal function continues to worsen.  Urine output dropping.  Increasing bicarbonate, adding back gentle IV hydration.  No improvement in oxygen requirements ?3/10 on Precedex and BIPAP. BP boarderline hypotension, bradycardia all 2/2 dex. Increased edema on CXR. Place CVL for co-ox monitoring and CVP, creatinine hitting plateau and UOP picking up  ?3/13 Less  confused per nursing, continued  high oxygen demand ? ?Interim History / Subjective:  ?On Bipap overnight.  Remains on precedex, pressors. ?Cor Track placed 3/13 ?Mag repleted 3/12 ?Heparin per pharmacy remains therapeutic ?CXR with worsening aeration to RUL and right base, Bilateral interstitial and airspace opacities  ?Remains on HHF at  ?Creatinine to 1.54 overnight ( From 2.50) Continue bladder irrigations so I&O is not accurate ? ?Labs reviewed  ?WBC 11.8/ HGB 7.9/ platelets 293 ?K 2.8/ Na 156/ Cl 117/CO2 30/ Glucose 156/Creatinine 1.54 from 2.50 / BUN 22 from 41 ?Afebrile ? ?Objective   ?Blood pressure (!) 120/52, pulse 62, temperature 98 ?F (36.7 ?C), temperature source Axillary, resp. rate 17, height '5\' 5"'$  (1.651 m), weight 96.8 kg, SpO2 100 %. ?CVP:  [77 mmHg] 77 mmHg  ?FiO2 (%):  [45 %-60 %] 60 %  ? ?Intake/Output Summary (Last 24 hours) at 07/20/2021 0956 ?Last data filed at 07/20/2021 0930 ?Gross per 24 hour  ?Intake 65212.41 ml  ?Output 61850 ml  ?Net 3362.41 ml  ? ?Filed Weights  ? 07/18/21 0500 07/19/21 0344 07/20/21 0800  ?Weight: 101.3 kg 96.8 kg 96.8 kg  ? ? ?Examination: ? ?General - Awake and agitated, MAE x 4, remains on HHF in NAD  ?Eyes - PERRLA ?ENT - Remains on HHF oxygen at 60% ( 30 L)  ?Cardiac - S1, S2, regular rate/rhythm, no murmur, rub or gallop ?Chest -  Bilateral chest excursion, Coarse throughout, diminished per bases. Rare wheeze noted ?Abdomen - soft, non tender, ND, + bowel sounds, Body mass index is  35.51 kg/m?.  ?Extremities - No obvious abnormalities, trace generalized edema ?Skin - areas of ecchymosis, warm, dry, intact ?Neuro - intermittently agitated, MAE x 4, Alert to self only ? ?Resolved Hospital Problem list   ?Lactic acidosis, Elevated trop d/t right heart strain  ? ?Assessment & Plan:  ? ?Acute respiratory failure with hypoxemia 2/2 Saddle PE, submassive with RV strain complicated by hcap(NOS) and now element of what looks like pulmonary edema. ?- goal SpO2 > 92% ?-  Bipap qhs and prn ?- completed ABx 3/11 ?- f/u CXR  ?- Consider sputum Cx  ?- Trend WBC and fever curve ? ?Drug induced Hypotension 2/2 precedex. ?Currently off pressors 3/13 ?- pressors as needed to keep MAP > 65 ? ?AKI from contrast and post obstructive with bladder mass. ?- nephrology consulted ?- D5W increased to 150 ml/hr>> will decrease to 50 cc per hour as creatine improving and add free water per tube ?- holding off on dialysis for now ?- monitor urine output>> I&O is not accurate because of bladder irrigations ? ?Hypernatremia ?- Will decrease D5W and add free water per tube ( CBG's are > 150) ?- Trend BMET ? ?Hypokalemia ?K rapidly decreasing ?Plan ?Re-check Mag and replete ?Repletion orders placed ?BMET daily ?BMET at 1700 to re-eval ? ? ?Acute urine retention, scrotal/penile edema, bladder mass, Rt renal mass, hematuria. ?- urology consulted and placed foley ?- bladder irrigation per urology ? ?Acute metabolic encephalopathy from uremia. ?Chronic pain. ?- RASS goal 0 ?- continue buprenorphine patch ?- Precedex ?- Will add Seroquel daily ? ?Anemia of critical illness. ?- f/u CBC ?- transfuse for Hb < 7 ? ?Best Practice (right click and "Reselect all SmartList Selections" daily)  ? ?Diet/type: Regular consistency (see orders) and NPO ?DVT prophylaxis: systemic heparin ?GI prophylaxis: N/A ?Lines: N/A ?Foley:  N/A ?Code Status:  full code ?Last date of multidisciplinary goals of care discussion [several were held on 3/4.  See documentation] ?As of 3/10 remains critically ill w/ what seems like poor prognosis at best. He is debilitated, oxygen requirements still significant. I am concerned that he may improve some but not have the reserves to recover ? ?Labs:  ? ?CMP Latest Ref Rng & Units 07/20/2021 07/19/2021 07/18/2021  ?Glucose 70 - 99 mg/dL 156(H) 151(H) 145(H)  ?BUN 8 - 23 mg/dL 22 41(H) 43(H)  ?Creatinine 0.61 - 1.24 mg/dL 1.54(H) 2.50(H) 2.77(H)  ?Sodium 135 - 145 mmol/L 156(H) 155(H) 154(H)  ?Potassium  3.5 - 5.1 mmol/L 2.8(L) 3.2(L) 3.1(L)  ?Chloride 98 - 111 mmol/L 117(H) 120(H) 120(H)  ?CO2 22 - 32 mmol/L '30 26 26  '$ ?Calcium 8.9 - 10.3 mg/dL 7.5(L) 7.8(L) 7.7(L)  ?Total Protein 6.5 - 8.1 g/dL - - -  ?Total Bilirubin 0.3 - 1.2 mg/dL - - -  ?Alkaline Phos 38 - 126 U/L - - -  ?AST 15 - 41 U/L - - -  ?ALT 0 - 44 U/L - - -  ? ? ?CBC Latest Ref Rng & Units 07/20/2021 07/19/2021 07/19/2021  ?WBC 4.0 - 10.5 K/uL 11.8(H) 14.6(H) -  ?Hemoglobin 13.0 - 17.0 g/dL 7.9(L) 8.4(L) 7.8(L)  ?Hematocrit 39.0 - 52.0 % 24.9(L) 26.9(L) 25.4(L)  ?Platelets 150 - 400 K/uL 293 334 -  ? ? ?ABG ?   ?Component Value Date/Time  ? PHART 7.268 (L) 07/16/2021 1159  ? PCO2ART 44.0 07/16/2021 1159  ? PO2ART 65 (L) 07/16/2021 1159  ? HCO3 20.0 07/16/2021 1159  ? TCO2 21 (L) 07/16/2021 1159  ? ACIDBASEDEF 6.0 (H) 07/16/2021  1159  ? O2SAT 78.3 07/17/2021 1257  ? ? ?CBG (last 3)  ?Recent Labs  ?  07/19/21 ?1127 07/19/21 ?1527 07/20/21 ?5726  ?GLUCAP 148* 140* 154*  ? ? ?Critical care time: 40 minutes  ?Magdalen Spatz, MSN, AGACNP-BC ?[facility] ?See Amion for personal pager ?PCCM on call pager [phone]  ?[place] ?07/20/2021, 9:56 AM ? ? ? ? ? ? ?

## 2021-07-20 NOTE — Progress Notes (Signed)
?  Subjective: ?Less confused this morning.  Still in mittens.  Wearing BiPAP.  Tolerating Foley.  Foley light pink-tinged. ? ?Objective: ?Vital signs in last 24 hours: ?Temp:  [97.3 ?F (36.3 ?C)-98.1 ?F (36.7 ?C)] 98 ?F (36.7 ?C) (03/13 0400) ?Pulse Rate:  [56-82] 81 (03/13 0743) ?Resp:  [15-32] 32 (03/13 0743) ?BP: (92-151)/(49-108) 147/102 (03/13 0700) ?SpO2:  [91 %-100 %] 91 % (03/13 0743) ?FiO2 (%):  [45 %-60 %] 60 % (03/13 0743) ? ?Intake/Output from previous day: ?03/12 0701 - 03/13 0700 ?In: 58992.5 [I.V.:4102.4; Blood:440; IV [id number]:017] ?Out: [phone] [ZESPQ:[id number]] ?Intake/Output this shift: ?Total I/O ?In: 6000 [Other:6000] ?Out: 1600 [Urine:1600] ? ?Physical Exam:  ?General: Alert and oriented ?CV: RRR ?Lungs: Clear ?Abdomen: Soft, ND, nontender, obese ?Ext: NT, No erythema ? ?Lab Results: ?Recent Labs  ?  07/19/21 ?1217 07/19/21 ?1809 07/20/21 ?0601  ?HGB 7.8* 8.4* 7.9*  ?HCT 25.4* 26.9* 24.9*  ? ?BMET ?Recent Labs  ?  07/19/21 ?0708 07/20/21 ?6226  ?NA 155* 156*  ?K 3.2* 2.8*  ?CL 120* 117*  ?CO2 26 30  ?GLUCOSE 151* 156*  ?BUN 41* 22  ?CREATININE 2.50* 1.54*  ?CALCIUM 7.8* 7.5*  ? ? ? ?Studies/Results: ?DG Chest Port 1 View ? ?Result Date: 07/20/2021 ?CLINICAL DATA:  Respiratory failure. EXAM: PORTABLE CHEST 1 VIEW COMPARISON:  07/18/21 FINDINGS: There is a left subclavian central venous catheter with tip in the projection of the distal SVC. Heart size and mediastinal contours are stable. Bilateral interstitial and airspace opacities are again noted. Compared with the previous exam there is been interval worsening aeration to the right upper lobe and right base. IMPRESSION: 1. Worsening aeration to the right upper lobe and right base. 2. Stable appearance of bilateral interstitial and airspace opacities. Electronically Signed   By: Kerby Moors M.D.   On: 07/20/2021 07:52   ? ?Assessment/Plan: ?1. Acute urinary retention: Was voiding with likely overflow incontinence. Foley placed 3/10 with initial  return of 1.5L clear yellow urine ?2. Scrotal and penile edema ?3. Bladder mass (seen on CT 2018 and RUS 07/17/21). This measured 4.3x2.6x3.5cm. 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 Unable to rule out malignancy yet. ?4. Right renal mass (RUS 07/17/21 with 2.9x2.4x2.2 solid right renal lesion) ?5. Gross hematuria: Likely prostatic in origin. Developed only after foley was placed. I suspect foley balloon was pulled into the prostate. I performed bedside u/s 3/11 after irrigating foley and noted decompressed bladder around foley with no mass identified. ?6. Admitted with acute respiratory failure with hypoxemia due to saddle pulmonary embolism, submassive with RV strain ?7. Hypotension due to precedex ?8. Acute metabolic encephalopathy from uremia ?  ?-Mannually irrigated 5104m normal saline with return of light pink urine, no clots, stable. ?-Continue CBI.  ?-Manually irrigate q4 hours and prn as needed for clots or decreased drainage ?-He is receiving feeding tube again today.  Once that is placed, start finasteride. ?-Hopefully bleeding should resolve.  If persistent, may need to consider fulguration or prostatic artery embolization. ?-I would like to obtain CT hematuria protocol once appropriate.  His creatinine has improved to 1.5.  Perhaps we can obtain this tomorrow if other teams agree. ?-Hemoglobin at 7.9.  He did receive 1 unit packed red blood cells over the weekend. ? ? LOS: 13 days  ? ?Matt R. Stanislav Gervase MD ?07/20/2021, 7:57 AM ?[facility]  ?Pager: [phone] ? ? ? ?

## 2021-07-20 NOTE — Progress Notes (Signed)
? [facility] ?Progress Note  ? ?Subjective:   Yelling out with some confusion this AM.  I/Os reflect CBI.  Cr 1.54 from 2.5.  Na remains 156, K 2.8 was repleted. Palliative care c/s GOC today.  ? ?Objective ?Vitals:  ? 07/20/21 1000 07/20/21 1100 07/20/21 1131 07/20/21 1200  ?BP: 139/75 118/66 131/75 (!) 97/52  ?Pulse: 75 69 75 67  ?Resp: 18 20 (!) 33 16  ?Temp:      ?TempSrc:      ?SpO2: 96% 100% 95% 100%  ?Weight:      ?Height:      ? ?Physical Exam ?General: at 45 degrees in bed yelling out ?Heart: RRR, no rub ?Lungs: clear ant ?Abdomen: soft, obese, reducible hernias, no bladder TTP ?Extremities: trace edema ?GU: CBI with cherry kool aid colored urine in bag ? ?Additional Objective ?Labs: ?Basic Metabolic Panel: ?Recent Labs  ?Lab 07/17/21 ?1613 07/18/21 ?0201 07/18/21 ?0345 07/19/21 ?0708 07/20/21 ?5993  ?NA 143 148* 154* 155* 156*  ?K 4.3 4.6 3.1* 3.2* 2.8*  ?CL 110 114* 120* 120* 117*  ?CO2 18* 21* '26 26 30  '$ ?GLUCOSE 97 143* 145* 151* 156*  ?BUN 68* 64* 43* 41* 22  ?CREATININE 4.93* 4.54* 2.77* 2.50* 1.54*  ?CALCIUM 8.1* 8.2* 7.7* 7.8* 7.5*  ?PHOS 8.1* 7.9* 4.8*  --   --   ? ?Liver Function Tests: ?No results for input(s): AST, ALT, ALKPHOS, BILITOT, PROT, ALBUMIN in the last 168 hours. ?No results for input(s): LIPASE, AMYLASE in the last 168 hours. ?CBC: ?Recent Labs  ?Lab 07/17/21 ?1613 07/18/21 ?0201 07/18/21 ?0345 07/19/21 ?1217 07/19/21 ?1809 07/20/21 ?0601  ?WBC 15.0* 17.2* 14.2*  --  14.6* 11.8*  ?HGB 8.4* 8.5* 6.8* 7.8* 8.4* 7.9*  ?HCT 26.5* 27.9* 22.2* 25.4* 26.9* 24.9*  ?MCV 87.2 89.1 88.4  --  90.0 89.2  ?PLT 263 283 297  --  334 293  ? ?Blood Culture ?   ?Component Value Date/Time  ? SDES BLOOD RIGHT ANTECUBITAL 07/01/2021 0951  ? SPECREQUEST  07/04/2021 0951  ?  BOTTLES DRAWN AEROBIC AND ANAEROBIC Blood Culture adequate volume  ? CULT  06/26/2021 0951  ?  NO GROWTH 6 DAYS ?Performed at [facility], [address] [place], [place] [address] ?  ? REPTSTATUS 07/13/2021 FINAL  06/28/2021 0951  ? ? ?Cardiac Enzymes: ?Recent Labs  ?Lab 07/14/21 ?1300  ?CKTOTAL 51  ? ?CBG: ?Recent Labs  ?Lab 07/19/21 ?0724 07/19/21 ?1127 07/19/21 ?1527 07/20/21 ?7939 07/20/21 ?1111  ?GLUCAP 132* 148* 140* 154* 155*  ? ?Iron Studies: No results for input(s): IRON, TIBC, TRANSFERRIN, FERRITIN in the last 72 hours. ?'@lablastinr3'$ @ ?Studies/Results: ?DG Chest Port 1 View ? ?Result Date: 07/20/2021 ?CLINICAL DATA:  Respiratory failure. EXAM: PORTABLE CHEST 1 VIEW COMPARISON:  07/18/21 FINDINGS: There is a left subclavian central venous catheter with tip in the projection of the distal SVC. Heart size and mediastinal contours are stable. Bilateral interstitial and airspace opacities are again noted. Compared with the previous exam there is been interval worsening aeration to the right upper lobe and right base. IMPRESSION: 1. Worsening aeration to the right upper lobe and right base. 2. Stable appearance of bilateral interstitial and airspace opacities. Electronically Signed   By: Kerby Moors M.D.   On: 07/20/2021 07:52  ? ?DG Abd Portable 1V ? ?Result Date: 07/20/2021 ?CLINICAL DATA:  Tube placement EXAM: PORTABLE ABDOMEN - 1 VIEW COMPARISON:  None. FINDINGS: Nonspecific paucity of bowel gas. Opacities in bilateral lung bases, better characterized on same day  chest radiograph. Enteric tube courses below the diaphragm with the tip in the Peri pyloric region. Degenerative changes of the spine. IMPRESSION: Enteric tube courses below the diaphragm with the tip in the peripyloric region. Electronically Signed   By: Margaretha Sheffield M.D.   On: 07/20/2021 10:00   ?Medications: ? sodium chloride 10 mL/hr at 07/19/21 1121  ? dexmedetomidine (PRECEDEX) IV infusion 1 mcg/kg/hr (07/20/21 1200)  ? dextrose 50 mL/hr at 07/20/21 1200  ? feeding supplement (VITAL 1.5 CAL) 1,000 mL (07/20/21 1047)  ? heparin 1,600 Units/hr (07/20/21 1200)  ? magnesium sulfate bolus IVPB 50 mL/hr at 07/20/21 1200  ? potassium chloride 10 mEq  (07/20/21 1240)  ? sodium chloride irrigation    ? sodium chloride irrigation    ? ? sodium chloride   Intravenous Once  ? buprenorphine  1 patch Transdermal Weekly  ? Chlorhexidine Gluconate Cloth  6 each Topical Q0600  ? feeding supplement (PROSource TF)  45 mL Per Tube BID  ? finasteride  5 mg Oral Daily  ? free water  200 mL Per Tube Q4H  ? lidocaine  1 application. Urethral Once  ? mouth rinse  15 mL Mouth Rinse BID  ? pantoprazole (PROTONIX) IV  40 mg Intravenous Q24H  ? QUEtiapine  50 mg Per Tube QHS  ? ? ? ?Assessment/Plan: ?**hypoxic respiratory failure:  PNA then saddle PE. S/p TPA.  Remains on HFNC awake, bipap while sleeping.  ? ?**AKI, severe:  last documented Cr 0.8 in 05/2019.  On presentation 2.2 2/28, severe AKI following PE/contrast/RV compromise, also was having urinary retention--> Cr peak 5 on 3/10.  Renal US 3/3 no obstruction (?R mass, see below), though developed retention later.  UA trace protein o/w bland.  Renal function is improving daily with Cr 1.5 today.  Per below may need iodinated contrast load today -- he's volume expanded and although there is a risk for AKI I think since renal function has improved so much it's reasonable to do the scan now if needed. ? ?**Hypernatremia:  persistent/worse - increase D5W from 50 to 75/hr and increase FWF 200 Q4 to 400 Q4,  Recheck sodium at 4pm.   ? ?**Hypokalemia: repleted. ? ?**Anemia:  Hb 7s, transfuse PRN. ? ?**Hematuria, bladder mass, R renal mass:  urology is following - CBI currently, finasteride; if bleeding continues ? Embolization.  Further imaging for mass planned. At this point he looks euvolemic and has IVF running - I think if it's necessary to do a contrasted CT to define mass that would be acceptable - certainly risk for AKI but ok to do if necessary.   ? ?Palliative care following - limited code, reconvening with family/pt 3/17. Remains tenuous. ? ?Jannifer Hick MD ?07/20/2021, 1:01 PM  ?[facility] ?Pager: [phone] ? ? ?

## 2021-07-20 NOTE — Progress Notes (Signed)
[facility] ADULT ICU REPLACEMENT PROTOCOL ? ? ?The patient does apply for the [facility] Adult ICU Electrolyte Replacment Protocol based on the criteria listed below:  ? ?1.Exclusion criteria: TCTS patients, ECMO patients, and Dialysis patients ?2. Is GFR >/= 30 ml/min? Yes.    ?Patient's GFR today is 46 ?3. Is SCr </= 2? Yes.   ?Patient's SCr is 1.54 mg/dL ?4. Did SCr increase >/= 0.5 in 24 hours? No. ?5.Pt's weight >40kg  Yes.   ?6. Abnormal electrolyte(s): K  ?7. Electrolytes replaced per protocol ?8.  Call MD STAT for K+ </= 2.5, Phos </= 1, or Mag </= 1 ?Physician:  Lucile Shutters ? ?Jason Holt Jason Holt 07/20/2021 6:31 AM  ?

## 2021-07-20 NOTE — Procedures (Signed)
Cortrak  Tube Type:  Cortrak - 43 inches Tube Location:  Left nare Initial Placement:  Stomach Secured by: Bridle Technique Used to Measure Tube Placement:  Marking at nare/corner of mouth Cortrak Secured At:  65 cm  Cortrak Tube Team Note:  Consult received to place a Cortrak feeding tube.   X-ray is required, abdominal x-ray has been ordered by the Cortrak team. Please confirm tube placement before using the Cortrak tube.   If the tube becomes dislodged please keep the tube and contact the Cortrak team at [email or web address] (password [email or web address]) for replacement.  If after hours and replacement cannot be delayed, place a NG tube and confirm placement with an abdominal x-ray.    Dontae Minerva MS, RD, LDN Please refer to AMION for RD and/or RD on-call/weekend/after hours pager   

## 2021-07-20 NOTE — Progress Notes (Signed)
?                                                   ?Daily Progress Note  ? ?Patient Name: Jason Holt.       Date: 07/20/2021 ?DOB: Jun 10, 1945  Age: 76 y.o. MRN#: [id number] ?Attending Physician: Jacky Kindle, MD ?Primary Care Physician: [facility] ?Admit Date: 06/12/2021 ? ?Reason for Consultation/Follow-up: Establishing goals of care ? ?Subjective: ?Medical records reviewed. Updates received from RN. Patient is more alert today, reporting back pain which is chronic and asking for pain medication. He tells me he is overall feeling "so-so."  ? ?I then called patient's daughter [name] to provide updates and support. We discussed his overall condition and debriefed family's goals of care conversation last week. She reiterates to me that family is committed to honoring patient's wishes "to get better." She is happy that her father seems to be improving from a renal and respiratory standpoint. However, she also understands that anything can happen at any time and patient's overall prognosis is tenuous. If he is able to be medically stabilized, family remains interested in [facility]. Encouraged and emphasized importance of continued conversation, offering to arrange another family meeting this week. [name] is open to this if needed.  ? ?I then called patient's daughter Anderson [place] as well, offered support and updates. She tells me she has just spoken with her sister and is agreeable to continuing the care plan for now with a tentative family meeting again on Friday. I emphasized the importance of hoping and praying for the best, while also planning for the worst. She understands patient is in critical condition and while he has recovered well from a serious illness in the past, he is at high risk to decline and/or poorly tolerate rehabilitation.  ? ?Questions  and concerns addressed. Family encouraged to call with additional needs. PMT will continue to support holistically. ? ?Length of Stay: 13 ? ?Current Medications: ?Scheduled Meds:  ?? sodium chloride   Intravenous Once  ?? buprenorphine  1 patch Transdermal Weekly  ?? Chlorhexidine Gluconate Cloth  6 each Topical Q0600  ?? feeding supplement (PROSource TF)  45 mL Per Tube BID  ?? finasteride  5 mg Oral Daily  ?? lidocaine  1 application. Urethral Once  ?? mouth rinse  15 mL Mouth Rinse BID  ?? pantoprazole (PROTONIX) IV  40 mg Intravenous Q24H  ?? potassium chloride  40 mEq Oral Once  ?? QUEtiapine  50 mg Per Tube QHS  ? ? ?Continuous Infusions: ?? sodium chloride 10 mL/hr at 07/19/21 1121  ?? dexmedetomidine (PRECEDEX) IV infusion 0.9 mcg/kg/hr (07/20/21 0900)  ?? dextrose 150 mL/hr at 07/20/21 0900  ?? feeding supplement (VITAL 1.5 CAL) Stopped (07/17/21 2100)  ?? heparin 1,600 Units/hr (07/20/21 0905)  ?? norepinephrine (LEVOPHED) Adult infusion Stopped (07/20/21 0422)  ?? potassium chloride 10 mEq (07/20/21 0916)  ?? sodium chloride irrigation    ?? sodium chloride irrigation    ? ? ?PRN Meds: ?sodium chloride, acetaminophen **OR** acetaminophen, hydrALAZINE, ipratropium-albuterol, [DISCONTINUED] ondansetron **OR** ondansetron (ZOFRAN) IV ? ?Physical Exam ?Vitals and nursing note reviewed.  ?Constitutional:   ?   General: He is not in acute distress. ?   Appearance: He is obese. He is ill-appearing.  ?   Interventions: Nasal cannula in place.  ?   Comments: 30L  ?  Cardiovascular:  ?   Rate and Rhythm: Normal rate.  ?Pulmonary:  ?   Effort: Pulmonary effort is normal.  ?Skin: ?   General: Skin is warm and dry.  ?Neurological:  ?   Mental Status: He is alert and easily aroused. He is disoriented.  ?         ? ?Vital Signs: BP (!) 120/52   Pulse 62   Temp 98 ?F (36.7 ?C) (Axillary)   Resp 17   Ht '5\' 5"'$  (1.651 m)   Wt 96.8 kg   SpO2 100%   BMI 35.51 kg/m?  ?SpO2: SpO2: 100 % ?O2 Device: O2 Device: High Flow  Nasal Cannula ?O2 Flow Rate: O2 Flow Rate (L/min): 30 L/min ? ?Intake/output summary:  ?Intake/Output Summary (Last 24 hours) at 07/20/2021 1009 ?Last data filed at 07/20/2021 0930 ?Gross per 24 hour  ?Intake 61694.06 ml  ?Output 60450 ml  ?Net 1244.06 ml  ? ? ?LBM: Last BM Date : 07/19/21 ?Baseline Weight: Weight: 100.7 kg ?Most recent weight: Weight: 96.8 kg ? ?     ?Palliative Assessment/Data: PPS 20% ? ? ? ? ? ?Patient Active Problem List  ? Diagnosis Date Noted  ?? Anemia 07/17/2021  ?? Hypotension due to drugs 07/17/2021  ?? Acute kidney failure ([place])   ?? Goals of care, counseling/discussion   ?? Palliative care by specialist   ?? Acute metabolic encephalopathy 70/96/2836  ?? 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 Atelectasis 07/15/2021  ?? Normal anion gap metabolic acidosis 62/94/7654  ?? Tremor 07/15/2021  ?? Chronic pain 07/15/2021  ?? Opioid dependence ([place]) 07/15/2021  ?? Acute pulmonary embolism ([place]) 07/10/2021  ?? Leukocytosis 07/09/2021  ?? Acute hypoxemic respiratory failure ([place]) 06/25/2021  ?? AKI (acute kidney injury) ([place]) 06/13/2021  ?? Pressure injury of skin 07/02/2021  ?? AMS (altered mental status) 06/08/2019  ?? Depression 06/08/2019  ?? Essential hypertension 06/08/2019  ?? GERD (gastroesophageal reflux disease) 08/15/2013  ? ? ?Palliative Care Assessment & Plan  ? ?HPI: ?76 y.o. male  with past medical history of chronic low back pain, hypertension, GERD, and chronic knee pain admitted on 07/06/2021 for low oxygen.  He was initially diagnosed with pneumonia and sepsis and also required BiPAP for several days.  He was transferred to [facility] March 4 and found to have a saddle PE.  On March 7 he was found to have rising creatinine and kidney function has continued to worsen.  On March 9 his mental status became more altered.  PMT consulted to discuss goals of care. ? ?Assessment: ?Acute hypoxic respiratory failure ?Saddle PE ?Pneumonia ?Acute metabolic encephalopathy, improving ?AKI ?Goals  of care conversation ? ?Recommendations/Plan: ?Continue PARTIAL CODE STATUS - NO CPR ?Continue current care, family remains hopeful for improvement ?Tentative family meeting for Friday 3/17, awaiting confirmation from patient's daughters ?Family confirms interest in [facility] if patient stabilizes further ?Psychosocial and emotional support provided ?Ongoing discussions and support from PMT ? ? ? ?MDM: High ? ?Dorthy Cooler, PA-C ?Palliative Medicine Team ?Team phone # [phone] ? ?Thank you for allowing the Palliative Medicine Team to assist in the care of this patient. Please utilize secure chat with additional questions, if there is no response within 30 minutes please call the above phone number. ? ?Palliative Medicine Team providers are available by phone from 7am to 7pm daily and can be reached through the team cell phone.  ?Should this patient require assistance outside of these hours, please call the patient's attending physician.  ?

## 2021-07-20 NOTE — Progress Notes (Addendum)
Nutrition Follow-up ? ?DOCUMENTATION CODES:  ? ?Obesity unspecified ? ?INTERVENTION:  ?Resume TF via Cortrak NGT with Vital 1.5 cal formula at goal rate of 55 ml/h (1320 ml per day) ?  ?Prosource TF 45 ml BID per tube ?  ?Tube feeding to provide 2060 kcals, 111 gm protein, 1003 ml free water daily. ? ?NUTRITION DIAGNOSIS:  ? ?Inadequate oral intake related to inability to eat as evidenced by NPO status. ? ?Ongoing ? ?GOAL:  ? ?Patient will meet greater than or equal to 90% of their needs ? ?Addressing needs via enteral nutrition ? ?MONITOR:  ? ?TF tolerance, Labs, Skin, I & O's, Weight trends ? ?REASON FOR ASSESSMENT:  ? ?Consult (Cortrak NGT placement) ?  ? ?ASSESSMENT:  ? ?76 year old male with PMH of hypertension and GERD admitted for respiratory failure initially treated for pneumonia and then found to have bilateral pulmonary emboli. He was given TPA on 3/5 with subjective improvement in his breathing but he remains on HFNC/BiPAP. ? ?Palliative care following. Plans for possible [facility]. Family meeting planned for Friday.  ? ?Cortrak initially placed 3/10. Per documentation, pt self removed on 3/11. Cortrak replaced today. Xray confirms placement in peripyloric region. Tube feeds resumed. Per Nephrologist, renal function appears to be improved. Increase free water flushes to aid in hypernatremia. ? ?Medications: IV protonix, precedex, D5 at 50 ml/hr, Mg sulfate ? ?Labs: sodium 156, potassium 2.8 (replacing), Cr 1.54, Mg 1.6 (replacing), CBG's 140-155 x 24 hours ? ?Diet Order:   ?Diet Order   ? ?       ?  Diet NPO time specified Except for: Sips with Meds  Diet effective now       ?  ? ?  ?  ? ?  ? ? ?EDUCATION NEEDS:  ? ?Not appropriate for education at this time ? ?Skin:  Skin Assessment: Skin Integrity Issues: ?Skin Integrity Issues:: Stage II ?Stage II: buttocks ? ?Last BM:  3/12 ? ?Height:  ? ?Ht Readings from Last 1 Encounters:  ?07/20/21 '5\' 5"'$  (1.651 m)  ? ? ?Weight:  ? ?Wt Readings from Last 1  Encounters:  ?07/20/21 96.8 kg  ?07/15/21: 99.8 kg ?Admit weight 94.2 kg ?I/O's since admission -27.5L ? ?Ideal Body Weight:  61.8 kg ? ?BMI:  Body mass index is 35.51 kg/m?. ? ?Estimated Nutritional Needs:  ? ?Kcal:  2000-2200 ? ?Protein:  110-120 grams ? ?Fluid:  >/= 2 L/day ? ?Clayborne Dana, RDN, LDN ?Clinical Nutrition ?

## 2021-07-20 NOTE — Progress Notes (Addendum)
ANTICOAGULATION CONSULT NOTE ? ?Pharmacy Consult for heparin ?Indication: pulmonary embolus ? ?Allergies  ?Allergen Reactions  ? Influenza Virus Vacc Split Pf Hives  ? Dye Fdc Red [Red Dye]   ? ? ?Patient Measurements: ?Height: '5\' 5"'$  (165.1 cm) ?Weight: 96.8 kg (213 lb 6.5 oz) ?IBW/kg (Calculated) : 61.5 ?HEPARIN DW (KG): 82.9  ? ?Labs: ?Recent Labs  ?  07/18/21 ?0345 07/19/21 ?0345 07/19/21 ?0708 07/19/21 ?1217 07/19/21 ?1809 07/20/21 ?0347 07/20/21 ?0601 07/20/21 ?1624  ?HGB 6.8*  --   --    < > 8.4*  --  7.9* 7.3*  ?HCT 22.2*  --   --    < > 26.9*  --  24.9* 24.5*  ?PLT 297  --   --   --  334  --  293 299  ?HEPARINUNFRC  --  0.54  --   --   --  0.70  --  0.72*  ?CREATININE 2.77*  --  2.50*  --   --  1.54*  --   --   ? < > = values in this interval not displayed.  ? ? ? ?Estimated Creatinine Clearance: 43 mL/min (A) (by C-G formula based on SCr of 1.54 mg/dL (H)). ? ? ?Assessment: ?71 YOM admitted for SOB, found to have acute PE with evidence of RHS (RV/LV ratio = 1.3) consistent with at least submassive PE. Patient transferred from [facility] to [facility] for possible IR intervention vs. thrombolytic. Patient is s/p systemic tPA on 3/5 and heparin was resumed.  ? ?New hematuria with clots noted on 3/11, and Urology irrigating bladder. Hematuria improving, noo further clots per RN. Hemoglobin trended down, s/p transfusion 3/12, stable today. Plt WNL. No issues with infusion per discussion with RN. ? ?Heparin level continuing to trend up to 0.72 units/mL even with rate decrease.  ? ? ?Goal of Therapy:  ?Heparin level 0.3-0.5 units/mL with new hematuria ?Monitor platelets by anticoagulation protocol: Yes ?  ?Plan:  ?Reduce heparin infusion to 1500 units/hr ?Check heparin level in 8 hours ?Monitor daily CBC, s/sx bleeding ?Monitor for hematuria resolution ? ?Thank you for allowing pharmacy to be a part of this patient?s care. ? ?Donnald Garre, PharmD ?Clinical Pharmacist ? ?Please check AMION for all [place] numbers ?After  10:00 PM, call [place] [phone] ? ?

## 2021-07-21 ENCOUNTER — Inpatient Hospital Stay (HOSPITAL_COMMUNITY): Payer: No Typology Code available for payment source

## 2021-07-21 DIAGNOSIS — I2602 Saddle embolus of pulmonary artery with acute cor pulmonale: Secondary | ICD-10-CM | POA: Diagnosis not present

## 2021-07-21 DIAGNOSIS — G9341 Metabolic encephalopathy: Secondary | ICD-10-CM | POA: Diagnosis not present

## 2021-07-21 DIAGNOSIS — N179 Acute kidney failure, unspecified: Secondary | ICD-10-CM | POA: Diagnosis not present

## 2021-07-21 DIAGNOSIS — J9601 Acute respiratory failure with hypoxia: Secondary | ICD-10-CM | POA: Diagnosis not present

## 2021-07-21 LAB — POCT I-STAT 7, (LYTES, BLD GAS, ICA,H+H)
Acid-Base Excess: 10 mmol/L — ABNORMAL HIGH (ref 0.0–2.0)
Acid-Base Excess: 4 mmol/L — ABNORMAL HIGH (ref 0.0–2.0)
Bicarbonate: 35.9 mmol/L — ABNORMAL HIGH (ref 20.0–28.0)
Bicarbonate: 30.1 mmol/L — ABNORMAL HIGH (ref 20.0–28.0)
Calcium, Ion: 1.13 mmol/L — ABNORMAL LOW (ref 1.15–1.40)
Calcium, Ion: 1.13 mmol/L — ABNORMAL LOW (ref 1.15–1.40)
HCT: 23 % — ABNORMAL LOW (ref 39.0–52.0)
HCT: 23 % — ABNORMAL LOW (ref 39.0–52.0)
Hemoglobin: 7.8 g/dL — ABNORMAL LOW (ref 13.0–17.0)
Hemoglobin: 7.8 g/dL — ABNORMAL LOW (ref 13.0–17.0)
O2 Saturation: 100 %
O2 Saturation: 95 %
Patient temperature: 99
Patient temperature: 99.3
Potassium: 4.2 mmol/L (ref 3.5–5.1)
Potassium: 3.4 mmol/L — ABNORMAL LOW (ref 3.5–5.1)
Sodium: 151 mmol/L — ABNORMAL HIGH (ref 135–145)
Sodium: 151 mmol/L — ABNORMAL HIGH (ref 135–145)
TCO2: 38 mmol/L — ABNORMAL HIGH (ref 22–32)
TCO2: 32 mmol/L (ref 22–32)
pCO2 arterial: 56.4 mmHg — ABNORMAL HIGH (ref 32–48)
pCO2 arterial: 51.6 mmHg — ABNORMAL HIGH (ref 32–48)
pH, Arterial: 7.412 (ref 7.35–7.45)
pH, Arterial: 7.375 (ref 7.35–7.45)
pO2, Arterial: 483 mmHg — ABNORMAL HIGH (ref 83–108)
pO2, Arterial: 82 mmHg — ABNORMAL LOW (ref 83–108)

## 2021-07-21 LAB — HEPARIN LEVEL (UNFRACTIONATED)
Heparin Unfractionated: 0.12 IU/mL — ABNORMAL LOW (ref 0.30–0.70)
Heparin Unfractionated: 0.45 IU/mL (ref 0.30–0.70)
Heparin Unfractionated: 0.52 IU/mL (ref 0.30–0.70)

## 2021-07-21 LAB — CBC
HCT: 24.6 % — ABNORMAL LOW (ref 39.0–52.0)
HCT: 25.1 % — ABNORMAL LOW (ref 39.0–52.0)
Hemoglobin: 7.5 g/dL — ABNORMAL LOW (ref 13.0–17.0)
Hemoglobin: 7.7 g/dL — ABNORMAL LOW (ref 13.0–17.0)
MCH: 28.1 pg (ref 26.0–34.0)
MCH: 28.3 pg (ref 26.0–34.0)
MCHC: 30.5 g/dL (ref 30.0–36.0)
MCHC: 30.7 g/dL (ref 30.0–36.0)
MCV: 92.1 fL (ref 80.0–100.0)
MCV: 92.3 fL (ref 80.0–100.0)
Platelets: 320 10*3/uL (ref 150–400)
Platelets: 326 10*3/uL (ref 150–400)
RBC: 2.67 MIL/uL — ABNORMAL LOW (ref 4.22–5.81)
RBC: 2.72 MIL/uL — ABNORMAL LOW (ref 4.22–5.81)
RDW: 15.9 % — ABNORMAL HIGH (ref 11.5–15.5)
RDW: 15.8 % — ABNORMAL HIGH (ref 11.5–15.5)
WBC: 15.2 10*3/uL — ABNORMAL HIGH (ref 4.0–10.5)
WBC: 14.4 10*3/uL — ABNORMAL HIGH (ref 4.0–10.5)
nRBC: 0.3 % — ABNORMAL HIGH (ref 0.0–0.2)
nRBC: 0.1 % (ref 0.0–0.2)

## 2021-07-21 LAB — BASIC METABOLIC PANEL
Anion gap: 5 (ref 5–15)
BUN: 13 mg/dL (ref 8–23)
CO2: 31 mmol/L (ref 22–32)
Calcium: 7.2 mg/dL — ABNORMAL LOW (ref 8.9–10.3)
Chloride: 112 mmol/L — ABNORMAL HIGH (ref 98–111)
Creatinine, Ser: 0.97 mg/dL (ref 0.61–1.24)
GFR, Estimated: >60 mL/min (ref >60)
Glucose, Bld: 200 mg/dL — ABNORMAL HIGH (ref 70–99)
Potassium: 3.4 mmol/L — ABNORMAL LOW (ref 3.5–5.1)
Sodium: 148 mmol/L — ABNORMAL HIGH (ref 135–145)

## 2021-07-21 LAB — GLUCOSE, CAPILLARY
Glucose-Capillary: 134 mg/dL — ABNORMAL HIGH (ref 70–99)
Glucose-Capillary: 150 mg/dL — ABNORMAL HIGH (ref 70–99)
Glucose-Capillary: 161 mg/dL — ABNORMAL HIGH (ref 70–99)
Glucose-Capillary: 163 mg/dL — ABNORMAL HIGH (ref 70–99)
Glucose-Capillary: 163 mg/dL — ABNORMAL HIGH (ref 70–99)
Glucose-Capillary: 186 mg/dL — ABNORMAL HIGH (ref 70–99)

## 2021-07-21 LAB — RENAL FUNCTION PANEL
Albumin: 1.6 g/dL — ABNORMAL LOW (ref 3.5–5.0)
Anion gap: 5 (ref 5–15)
BUN: 14 mg/dL (ref 8–23)
CO2: 29 mmol/L (ref 22–32)
Calcium: 7.4 mg/dL — ABNORMAL LOW (ref 8.9–10.3)
Chloride: 116 mmol/L — ABNORMAL HIGH (ref 98–111)
Creatinine, Ser: 0.96 mg/dL (ref 0.61–1.24)
GFR, Estimated: >60 mL/min (ref >60)
Glucose, Bld: 172 mg/dL — ABNORMAL HIGH (ref 70–99)
Phosphorus: 2.4 mg/dL — ABNORMAL LOW (ref 2.5–4.6)
Potassium: 3.5 mmol/L (ref 3.5–5.1)
Sodium: 150 mmol/L — ABNORMAL HIGH (ref 135–145)

## 2021-07-21 LAB — MAGNESIUM: Magnesium: 1.6 mg/dL — ABNORMAL LOW (ref 1.7–2.4)

## 2021-07-21 MED ORDER — FUROSEMIDE 10 MG/ML IJ SOLN
40.0000 mg | Freq: Once | INTRAMUSCULAR | Status: AC
  Administered 2021-07-21: 40 mg via INTRAVENOUS
  Filled 2021-07-21: qty 4

## 2021-07-21 MED ORDER — ETOMIDATE 2 MG/ML IV SOLN
INTRAVENOUS | Status: AC
  Filled 2021-07-21: qty 20

## 2021-07-21 MED ORDER — POTASSIUM & SODIUM PHOSPHATES 280-160-250 MG PO PACK: 1.0000 | PACK | Freq: Four times a day (QID) | ORAL | Status: DC

## 2021-07-21 MED ORDER — ORAL CARE MOUTH RINSE
15.0000 mL | OROMUCOSAL | Status: DC
  Administered 2021-07-21 – 2021-07-24 (×31): 15 mL via OROMUCOSAL

## 2021-07-21 MED ORDER — NOREPINEPHRINE 4 MG/250ML-% IV SOLN
0.0000 ug/min | INTRAVENOUS | Status: DC
  Administered 2021-07-21: 2 ug/min via INTRAVENOUS
  Filled 2021-07-21: qty 250

## 2021-07-21 MED ORDER — IOHEXOL 300 MG/ML  SOLN
125.0000 mL | Freq: Once | INTRAMUSCULAR | Status: AC | PRN
  Administered 2021-07-21: 125 mL via INTRAVENOUS

## 2021-07-21 MED ORDER — FENTANYL BOLUS VIA INFUSION
25.0000 ug | INTRAVENOUS | Status: DC | PRN
  Administered 2021-07-21 – 2021-07-23 (×3): 50 ug via INTRAVENOUS
  Administered 2021-07-23: 100 ug via INTRAVENOUS
  Administered 2021-07-23 (×2): 75 ug via INTRAVENOUS
  Administered 2021-07-23: 50 ug via INTRAVENOUS
  Administered 2021-07-23: 75 ug via INTRAVENOUS
  Administered 2021-07-24 (×2): 50 ug via INTRAVENOUS
  Administered 2021-07-24 (×2): 100 ug via INTRAVENOUS
  Filled 2021-07-21: qty 100

## 2021-07-21 MED ORDER — FENTANYL CITRATE PF 50 MCG/ML IJ SOSY: 25.0000 ug | PREFILLED_SYRINGE | Freq: Once | INTRAMUSCULAR | Status: DC

## 2021-07-21 MED ORDER — POLYETHYLENE GLYCOL 3350 17 G PO PACK
17.0000 g | PACK | Freq: Every day | ORAL | Status: DC
  Administered 2021-07-23 – 2021-07-24 (×2): 17 g
  Filled 2021-07-21 (×2): qty 1

## 2021-07-21 MED ORDER — ETOMIDATE 2 MG/ML IV SOLN
20.0000 mg | Freq: Once | INTRAVENOUS | Status: AC
  Administered 2021-07-21: 20 mg via INTRAVENOUS

## 2021-07-21 MED ORDER — GERHARDT'S BUTT CREAM
TOPICAL_CREAM | CUTANEOUS | Status: DC | PRN
  Filled 2021-07-21: qty 1

## 2021-07-21 MED ORDER — POTASSIUM CHLORIDE 20 MEQ PO PACK
40.0000 meq | PACK | Freq: Once | ORAL | Status: AC
  Administered 2021-07-21: 40 meq
  Filled 2021-07-21: qty 2

## 2021-07-21 MED ORDER — DOCUSATE SODIUM 50 MG/5ML PO LIQD
100.0000 mg | Freq: Two times a day (BID) | ORAL | Status: DC
  Administered 2021-07-22 – 2021-07-24 (×2): 100 mg
  Filled 2021-07-21 (×3): qty 10

## 2021-07-21 MED ORDER — PHENYLEPHRINE 40 MCG/ML (10ML) SYRINGE FOR IV PUSH (FOR BLOOD PRESSURE SUPPORT)
PREFILLED_SYRINGE | INTRAVENOUS | Status: AC
Start: 2021-07-21 — End: 2021-07-22
  Filled 2021-07-21: qty 10

## 2021-07-21 MED ORDER — FENTANYL 2500MCG IN NS 250ML (10MCG/ML) PREMIX INFUSION
25.0000 ug/h | INTRAVENOUS | Status: DC
  Administered 2021-07-21: 25 ug/h via INTRAVENOUS
  Administered 2021-07-22 – 2021-07-23 (×2): 200 ug/h via INTRAVENOUS
  Administered 2021-07-23: 150 ug/h via INTRAVENOUS
  Administered 2021-07-24: 175 ug/h via INTRAVENOUS
  Administered 2021-07-24: 200 ug/h via INTRAVENOUS
  Filled 2021-07-21 (×6): qty 250

## 2021-07-21 MED ORDER — FENTANYL CITRATE PF 50 MCG/ML IJ SOSY
PREFILLED_SYRINGE | INTRAMUSCULAR | Status: AC
  Filled 2021-07-21: qty 2

## 2021-07-21 MED ORDER — POTASSIUM CHLORIDE 20 MEQ PO PACK
40.0000 meq | PACK | ORAL | Status: AC
  Administered 2021-07-21 (×2): 40 meq
  Filled 2021-07-21 (×2): qty 2

## 2021-07-21 MED ORDER — ROCURONIUM BROMIDE 50 MG/5ML IV SOLN
100.0000 mg | Freq: Once | INTRAVENOUS | Status: AC
  Administered 2021-07-21: 100 mg via INTRAVENOUS
  Filled 2021-07-21: qty 10

## 2021-07-21 MED ORDER — MIDAZOLAM HCL 2 MG/2ML IJ SOLN
INTRAMUSCULAR | Status: AC
  Filled 2021-07-21: qty 2

## 2021-07-21 MED ORDER — ATROPINE SULFATE 1 MG/10ML IJ SOSY
PREFILLED_SYRINGE | INTRAMUSCULAR | Status: AC
  Filled 2021-07-21: qty 10

## 2021-07-21 MED ORDER — SODIUM CHLORIDE 0.9 % IV SOLN
3.0000 g | Freq: Four times a day (QID) | INTRAVENOUS | Status: DC
  Administered 2021-07-21 – 2021-07-24 (×13): 3 g via INTRAVENOUS
  Filled 2021-07-21 (×14): qty 8

## 2021-07-21 MED ORDER — POTASSIUM & SODIUM PHOSPHATES 280-160-250 MG PO PACK
1.0000 | PACK | Freq: Four times a day (QID) | ORAL | Status: AC
  Administered 2021-07-21: 1
  Filled 2021-07-21: qty 1

## 2021-07-21 MED ORDER — ROCURONIUM BROMIDE 10 MG/ML (PF) SYRINGE
PREFILLED_SYRINGE | INTRAVENOUS | Status: AC
  Filled 2021-07-21: qty 10

## 2021-07-21 MED ORDER — LOPERAMIDE HCL 1 MG/7.5ML PO SUSP
2.0000 mg | ORAL | Status: DC | PRN
  Administered 2021-07-21: 2 mg
  Filled 2021-07-21: qty 15

## 2021-07-21 MED ORDER — MAGNESIUM SULFATE 4 GM/100ML IV SOLN
4.0000 g | Freq: Once | INTRAVENOUS | Status: AC
  Administered 2021-07-21: 4 g via INTRAVENOUS
  Filled 2021-07-21: qty 100

## 2021-07-21 NOTE — Progress Notes (Addendum)
Pharmacy Antibiotic Note ? ?Jason Holt. is a 76 y.o. male admitted on 06/20/2021 with pneumonia.  Pharmacy has been consulted for Unasyn dosing. SCr improving to 0.96. ? ?Plan: ?Unasyn 3g IV q6h x 7 days per CCM ?Monitor clinical progress, c/s, renal function ?F/u de-escalation plan/LOT ? ? ?Height: '5\' 5"'$  (165.1 cm) ?Weight: 96.8 kg (213 lb 6.5 oz) ?IBW/kg (Calculated) : 61.5 ? ?Temp (24hrs), Avg:97.8 ?F (36.6 ?C), Min:97.2 ?F (36.2 ?C), Max:99.1 ?F (37.3 ?C) ? ?Recent Labs  ?Lab 07/18/21 ?0345 07/19/21 ?0708 07/19/21 ?1809 07/20/21 ?2395 07/20/21 ?0601 07/20/21 ?1624 07/21/21 ?3202  ?WBC 14.2*  --  14.6*  --  11.8* 13.1* 15.2*  ?CREATININE 2.77* 2.50*  --  1.54*  --  1.22 0.96  ?  ?Estimated Creatinine Clearance: 68.9 mL/min (by C-G formula based on SCr of 0.96 mg/dL).   ? ?Allergies  ?Allergen Reactions  ? Influenza Virus Vacc Split Pf Hives  ? Dye Fdc Red [Red Dye]   ? ? ?Arturo Morton, PharmD, BCPS ?Please check AMION for all [place] contact numbers ?Clinical Pharmacist ?07/21/2021 10:40 AM ? ?

## 2021-07-21 NOTE — Progress Notes (Signed)
?  Interdisciplinary Goals of Care Family Meeting ? ? ?Date carried out:: 07/21/2021 ? ?Location of the meeting: Bedside ? ?Member's involved: Nurse Practitioner, Bedside Registered Nurse, Family Member or next of kin, and Palliative care team member ? ?Durable Power of Tour manager: Patient's two daughters are present.  ? ?Discussion: We discussed goals of care for [name]. They understand he is nearing the need for intubation and he is critically ill. They also understand even with intubation he may not recover. Risks and benefits have been explained. They wish to proceed with intubation if necessary, which it will likely be. He has beeun unable to spend meaningful time off BiPAP and is clinically worsening from a pulmonary standpoint.  ? ?Code status: Limited Code or DNR with short term and Mechanical ventilation ? ?Disposition: Continue current acute care ? ? ?Time spent for the meeting: 35 minutes ? ?Jason Holt ?07/21/2021, 1:38 PM ? ?

## 2021-07-21 NOTE — Progress Notes (Signed)
ANTICOAGULATION CONSULT NOTE ? ?Pharmacy Consult for heparin ?Indication: pulmonary embolus ? ?Allergies  ?Allergen Reactions  ? Influenza Virus Vacc Split Pf Hives  ? Dye Fdc Red [Red Dye]   ? ? ?Patient Measurements: ?Height: '5\' 5"'$  (165.1 cm) ?Weight: 96.8 kg (213 lb 6.5 oz) ?IBW/kg (Calculated) : 61.5 ?HEPARIN DW (KG): 82.9  ? ?Labs: ?Recent Labs  ?  07/20/21 ?3976 07/20/21 ?0601 07/20/21 ?1624 07/21/21 ?0100 07/21/21 ?7341 07/21/21 ?0943  ?HGB  --  7.9* 7.3*  --  7.5*  --   ?HCT  --  24.9* 24.5*  --  24.6*  --   ?PLT  --  293 299  --  320  --   ?HEPARINUNFRC 0.70  --  0.72* 0.12* 0.45 0.52  ?CREATININE 1.54*  --  1.22  --  0.96  --   ? ? ? ?Estimated Creatinine Clearance: 68.9 mL/min (by C-G formula based on SCr of 0.96 mg/dL). ? ? ?Assessment: ?18 YOM admitted for SOB, found to have acute PE with evidence of RHS (RV/LV ratio = 1.3) consistent with at least submassive PE. Patient transferred from [facility] to [facility] for possible IR intervention vs. thrombolytic. Patient is s/p systemic tPA on 3/5 and heparin was resumed.  ? ?New hematuria with clots noted on 3/11, and Urology irrigating bladder. Hematuria improving, no further clots per RN. Hemoglobin trended down, s/p transfusion 3/12 - stable today, plt wnl. ? ?Confirmatory heparin level very slightly supratherapeutic for lower goal 0.3-0.5 on heparin at 1550 units/hr. Level was previously subtherapeutic on 1500 units/hr. Will leave for now since still in normal therapeutic range and would rather patient be therapeutic with acute PE vs. Low. Hematuria improved again today, resolving per Urology. ? ?Goal of Therapy:  ?Heparin level 0.3-0.5 units/mL with new hematuria ?Monitor platelets by anticoagulation protocol: Yes ?  ?Plan:  ?Continue heparin at 1550 units/hr for now ?Recheck heparin level with AM labs, daily CBC ?Watch Hgb closely in setting of some hematuria (improving) ? ? ?Arturo Morton, PharmD, BCPS ?Please check AMION for all [place] contact  numbers ?Clinical Pharmacist ?07/21/2021 10:44 AM ?

## 2021-07-21 NOTE — Progress Notes (Signed)
?Subjective: ?Appears agitated and confused this AM. Urine much better, clear with only slight pink tinge on slow drip. ? ?Objective: ?Vital signs in last 24 hours: ?Temp:  [97.2 ?F (36.2 ?C)-97.6 ?F (36.4 ?C)] 97.5 ?F (36.4 ?C) (03/14 0400) ?Pulse Rate:  [62-88] 81 (03/14 0730) ?Resp:  [16-33] 30 (03/14 0730) ?BP: (96-148)/(45-98) 115/98 (03/14 0730) ?SpO2:  [88 %-100 %] 100 % (03/14 0730) ?FiO2 (%):  [45 %-80 %] 55 % (03/14 0400) ? ?Intake/Output from previous day: ?03/13 0701 - 03/14 0700 ?In: 45170.6 [I.V.:2763.5; NG/GT:2856.9; IV Piggyback:550.2] ?Out: 08657 [QIONG:29528] ?Intake/Output this shift: ?No intake/output data recorded. ? ?Physical Exam:  ?General: Alert and oriented ?CV: RRR ?Lungs: Clear ?Abdomen: Soft, ND, NT ?Ext: NT, No erythema ? ?Lab Results: ?Recent Labs  ?  07/20/21 ?0601 07/20/21 ?1624 07/21/21 ?4132  ?HGB 7.9* 7.3* 7.5*  ?HCT 24.9* 24.5* 24.6*  ? ?BMET ?Recent Labs  ?  07/20/21 ?1624 07/21/21 ?4401  ?NA 152* 150*  ?K 4.3 3.5  ?CL 119* 116*  ?CO2 28 29  ?GLUCOSE 171* 172*  ?BUN 18 14  ?CREATININE 1.22 0.96  ?CALCIUM 7.4* 7.4*  ? ? ? ?Studies/Results: ?DG Chest Port 1 View ? ?Result Date: 07/20/2021 ?CLINICAL DATA:  Respiratory failure. EXAM: PORTABLE CHEST 1 VIEW COMPARISON:  07/18/21 FINDINGS: There is a left subclavian central venous catheter with tip in the projection of the distal SVC. Heart size and mediastinal contours are stable. Bilateral interstitial and airspace opacities are again noted. Compared with the previous exam there is been interval worsening aeration to the right upper lobe and right base. IMPRESSION: 1. Worsening aeration to the right upper lobe and right base. 2. Stable appearance of bilateral interstitial and airspace opacities. Electronically Signed   By: Kerby Moors M.D.   On: 07/20/2021 07:52  ? ?DG Abd Portable 1V ? ?Result Date: 07/20/2021 ?CLINICAL DATA:  Tube placement EXAM: PORTABLE ABDOMEN - 1 VIEW COMPARISON:  None. FINDINGS: Nonspecific paucity of bowel  gas. Opacities in bilateral lung bases, better characterized on same day chest radiograph. Enteric tube courses below the diaphragm with the tip in the Peri pyloric region. Degenerative changes of the spine. IMPRESSION: Enteric tube courses below the diaphragm with the tip in the peripyloric region. Electronically Signed   By: Margaretha Sheffield M.D.   On: 07/20/2021 10:00   ? ?Assessment/Plan: ?1.         Acute urinary retention: Was voiding with likely overflow incontinence. Foley placed 3/10 with initial return of 1.5L clear yellow urine ?2.         Scrotal and penile edema ?3.         Bladder mass (seen on CT 2018 and RUS 07/17/21). This measured 4.3x2.6x3.5cm. 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 Unable to rule out malignancy yet. ?4.         Right renal mass (RUS 07/17/21 with 2.9x2.4x2.2 solid right renal lesion) ?5.         Gross hematuria: Likely prostatic in origin. Developed only after foley was placed. I suspect foley balloon was pulled into the prostate. I performed bedside u/s 3/11 after irrigating foley and noted decompressed bladder around foley with no mass identified. ?6.         Admitted with acute respiratory failure with hypoxemia due to saddle pulmonary embolism, submassive with RV strain ?7.         Hypotension due to precedex ?8.         Acute metabolic encephalopathy from uremia ?  ?-Urine  much improved, only slight pink tinge on slow drip ?-Hgb stable over the past several days ?Continue CBI ?-Manually irrigate q4 hours and prn as needed for clots or decreased drainage ?-Continue finasteride ?-Cr has normalized. Recommend CT hematuria protocol if ok by primary. ? ? LOS: 14 days  ? ?Matt R. Loreal Schuessler MD ?07/21/2021, 8:16 AM ?[facility]  ?Pager: [phone] ? ? ? ?

## 2021-07-21 NOTE — Procedures (Signed)
Intubation Procedure Note ? ?Idelia Salm [name].  ?[id number]  ?1946-02-24 ? ?Date:07/21/21  ?Time:3:36 PM  ? ?Provider Performing:Eusevio Schriver  ? ? ?Procedure: Intubation (32549) ? ?Indication(s) ?Respiratory Failure ? ?Consent ?Risks of the procedure as well as the alternatives and risks of each were explained to the patient and/or caregiver.  Consent for the procedure was obtained and is signed in the bedside chart ? ? ?Anesthesia ?Etomidate and Rocuronium ? ? ?Time Out ?Verified patient identification, verified procedure, site/side was marked, verified correct patient position, special equipment/implants available, medications/allergies/relevant history reviewed, required imaging and test results available. ? ? ?Sterile Technique ?Usual hand hygeine, masks, and gloves were used ? ? ?Procedure Description ?Patient positioned in bed supine.  Sedation given as noted above.  Patient was intubated with endotracheal tube using  DL .  View was Grade 1 full glottis .  Number of attempts was 1.  Colorimetric CO2 detector was consistent with tracheal placement. ? ? ?Complications/Tolerance ?None; patient tolerated the procedure well. ?Chest X-ray is ordered to verify placement. ? ? ?EBL ?Minimal ? ? ?Specimen(s) ?None ? ?

## 2021-07-21 NOTE — Progress Notes (Signed)
?                                                   ?Daily Progress Note  ? ?Patient Name: Jason Holt.       Date: 07/21/2021 ?DOB: [birth year]  Age: 76 y.o. MRN#: [id number] ?Attending Physician: Jacky Kindle, MD ?Primary Care Physician: [facility] ?Admit Date: 07/06/2021 ? ?Reason for Consultation/Follow-up: Establishing goals of care ? ?Subjective: ?Medical records reviewed. Updates received from RN. Patient assessed at the bedside. He reports feeling exhausted. His daughters Jason Holt and Jason Holt are present; his grandson and grandson's wife later join at the bedside for goals of care conversation with this PA and Georgann Housekeeper NP/ PCCM.  ? ?Created space and opportunity for patient and family to share their thoughts and and feelings regarding Jason Holt current illness. This is an incredibly difficult situation for all, as Jason Holt shares he wants "more time, just two more weeks to get better." He is not able to describe what getting better would look like other than living longer. I encouraged patient and family to consider his definition of a meaningful quality of life moving forward. He would never want to go to a nursing home. We discussed that aggressive interventions such as mechanical ventilation will provide benefits that are uncertain at best. Counseled family that in the best case scenario, if Jason Holt were to come off the ventilator this would certainly lead to SNF and a prolonged recovery with high risk for complications including recurrent infections, pressure injuries, and ongoing delirium. Emphasized the importance of weighing the risks and benefits of treatment options to make an informed decision, ensuring that Jason Holt preferences are prioritized. He verbalizes that he would like to proceed with a trial of artificial ventilation,  stating "if not you'll just let me die." Emotional support and therapeutic listening was provided, clarifying that there is an option to continue current supportive care without escalation.  ? ?Patient's family is also interested in a CT scan of the chest to gain more information about his lungs. They are not ready for a comfort-focused approach at this time.   ? ?Questions and concerns addressed. Family encouraged to call with additional needs. PMT will continue to support holistically. ? ?Length of Stay: 14 ? ?Current Medications: ?Scheduled Meds:  ?? sodium chloride   Intravenous Once  ?? buprenorphine  1 patch Transdermal Weekly  ?? Chlorhexidine Gluconate Cloth  6 each Topical Q0600  ?? feeding supplement (PROSource TF)  45 mL Per Tube BID  ?? finasteride  5 mg Oral Daily  ?? free water  400 mL Per Tube Q4H  ?? lidocaine  1 application. Urethral Once  ?? mouth rinse  15 mL Mouth Rinse BID  ?? pantoprazole (PROTONIX) IV  40 mg Intravenous Q24H  ?? potassium & sodium phosphates  1 packet Per Tube Q6H  ?? potassium chloride  40 mEq Per Tube Q4H  ?? QUEtiapine  50 mg Per Tube QHS  ? ? ?Continuous Infusions: ?? sodium chloride 10 mL/hr at 07/19/21 1121  ?? ampicillin-sulbactam (UNASYN) IV Stopped (07/21/21 1153)  ?? dexmedetomidine (PRECEDEX) IV infusion 1 mcg/kg/hr (07/21/21 1200)  ?? dextrose 75 mL/hr at 07/21/21 1200  ?? feeding supplement (VITAL 1.5 CAL) 1,000 mL (07/21/21 0925)  ?? heparin 1,550 Units/hr (07/21/21 1200)  ?? sodium chloride irrigation    ??  sodium chloride irrigation    ? ? ?PRN Meds: ?sodium chloride, acetaminophen **OR** acetaminophen, Gerhardt's butt cream, hydrALAZINE, ipratropium-albuterol, loperamide HCl, [DISCONTINUED] ondansetron **OR** ondansetron (ZOFRAN) IV, oxyCODONE ? ?Physical Exam ?Vitals and nursing note reviewed.  ?Constitutional:   ?   General: He is not in acute distress. ?   Appearance: He is obese. He is ill-appearing.  ?   Interventions: Nasal cannula in place.  ?    Comments: 30L  ?Cardiovascular:  ?   Rate and Rhythm: Normal rate.  ?Pulmonary:  ?   Effort: Pulmonary effort is normal.  ?Skin: ?   General: Skin is warm and dry.  ?Neurological:  ?   Mental Status: He is alert and easily aroused. He is disoriented.  ?         ? ?Vital Signs: BP 109/61 (BP Location: Left Arm)   Pulse 94   Temp 99.1 ?F (37.3 ?C) (Axillary)   Resp (!) 29   Ht '5\' 5"'$  (1.651 m)   Wt 96.8 kg   SpO2 97%   BMI 35.51 kg/m?  ?SpO2: SpO2: 97 % ?O2 Device: O2 Device: Bi-PAP ?O2 Flow Rate: O2 Flow Rate (L/min): 25 L/min ? ?Intake/output summary:  ?Intake/Output Summary (Last 24 hours) at 07/21/2021 1330 ?Last data filed at 07/21/2021 1200 ?Gross per 24 hour  ?Intake 30625.16 ml  ?Output 25650 ml  ?Net 4975.16 ml  ? ? ?LBM: Last BM Date : 07/21/21 ?Baseline Weight: Weight: 100.7 kg ?Most recent weight: Weight: 96.8 kg ? ?     ?Palliative Assessment/Data: PPS 20% ? ? ? ? ? ?Patient Active Problem List  ? Diagnosis Date Noted  ?? Community acquired pneumonia   ?? Acute pulmonary edema (HCC)   ?? Anemia 07/17/2021  ?? Hypotension due to drugs 07/17/2021  ?? Acute kidney failure ([place])   ?? Goals of care, counseling/discussion   ?? Palliative care by specialist   ?? Acute metabolic encephalopathy 56/38/7564  ?? 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 Atelectasis 07/15/2021  ?? Normal anion gap metabolic acidosis 33/29/5188  ?? Tremor 07/15/2021  ?? Chronic pain 07/15/2021  ?? Opioid dependence ([place]) 07/15/2021  ?? Acute pulmonary embolism ([place]) 07/10/2021  ?? Leukocytosis 07/09/2021  ?? Acute hypoxemic respiratory failure ([place]) 06/16/2021  ?? AKI (acute kidney injury) ([place]) 06/29/2021  ?? Pressure injury of skin 06/25/2021  ?? AMS (altered mental status) 06/08/2019  ?? Depression 06/08/2019  ?? Essential hypertension 06/08/2019  ?? GERD (gastroesophageal reflux disease) 08/15/2013  ? ? ?Palliative Care Assessment & Plan  ? ?HPI: ?76 y.o. male  with past medical history of chronic low back pain, hypertension,  GERD, and chronic knee pain admitted on 06/23/2021 for low oxygen.  He was initially diagnosed with pneumonia and sepsis and also required BiPAP for several days.  He was transferred to [facility] March 4 and found to have a saddle PE.  On March 7 he was found to have rising creatinine and kidney function has continued to worsen.  On March 9 his mental status became more altered.  PMT consulted to discuss goals of care. ? ?Assessment: ?Acute hypoxic respiratory failure ?Saddle PE ?Pneumonia ?Acute metabolic encephalopathy, improving ?AKI ?Goals of care conversation ? ?Recommendations/Plan: ?Partial code - no CPR ?Proceed with intubation and trial of mechanical ventilation, additional aggressive interventions and diagnostics as indicated with goal of improvement if possible  ?Family wishes to defer scheduling another family meeting for the time being, agreeable to follow up later in the week ?Psychosocial and emotional support provided ?PMT  will continue to monitor ? ? ?Total time: ?I spent 70 minutes in the care of the patient today in the above activities and documenting the encounter. ? ?MDM: High ? ? ?Dorthy Cooler, PA-C ?Palliative Medicine Team ?Team phone # [phone] ? ?Thank you for allowing the Palliative Medicine Team to assist in the care of this patient. Please utilize secure chat with additional questions, if there is no response within 30 minutes please call the above phone number. ? ?Palliative Medicine Team providers are available by phone from 7am to 7pm daily and can be reached through the team cell phone.  ?Should this patient require assistance outside of these hours, please call the patient's attending physician.  ?

## 2021-07-21 NOTE — Progress Notes (Signed)
eLink Physician-Brief Progress Note ?Patient Name: Jason Holt. ?DOB: 07/26/1945 ?MRN: [id number] ? ? ?Date of Service ? 07/21/2021  ?HPI/Events of Note ? Hypotension.  ?eICU Interventions ? Norepinephrine gtt ordered.  ? ? ? ?  ? ?Frederik Pear ?07/21/2021, 7:31 PM ?

## 2021-07-21 NOTE — Progress Notes (Signed)
OT Cancellation Note ? ?Patient Details ?Name: Jason Holt. ?MRN: [id number] ?DOB: 1945/05/19 ? ? ?Cancelled Treatment:    Reason Eval/Treat Not Completed: Medical issues which prohibited therapy (RN requested therapy hold, pt on BiPAP, goals of care meeting with family today at 2:30. OT treatment to f/u as appropriate) ? ?Jason Holt ?07/21/2021, 11:04 AM ?

## 2021-07-21 NOTE — Progress Notes (Signed)
After discussion with patient's family at bedside, decision was made to proceed with endotracheal intubation while keeping him DNR/limited code ?Continue with IV diuretics and IV antibiotics with hope to take him off ventilator in the next few days ? ?Patient was intubated and placed on mechanical ventilator, during intubation he became transiently bradycardic to 30s but within 10-second it came up to 70s with a stable blood pressure ? ? ?Additional critical care time: 17 minutes ? ?Performed by: Jacky Kindle ?  ?Critical care time was exclusive of separately billable procedures and treating other patients. ?  ?Critical care was necessary to treat or prevent imminent or life-threatening deterioration. ?  ?Critical care was time spent personally by me on the following activities: development of treatment plan with patient and/or surrogate as well as nursing, discussions with consultants, evaluation of patient's response to treatment, examination of patient, obtaining history from patient or surrogate, ordering and performing treatments and interventions, ordering and review of laboratory studies, ordering and review of radiographic studies, pulse oximetry and re-evaluation of patient's condition. ?  ?Jacky Kindle MD ?[facility] ?See Amion for pager ?If no response to pager, please call [phone] until 7pm ?After 7pm, Please call E-link [phone] ? ?

## 2021-07-21 NOTE — Progress Notes (Addendum)
? ?NAME:  Jason Holt., MRN:  [id number], DOB:  May 23, 1945, LOS: 14 ?ADMISSION DATE:  06/20/2021, CONSULTATION DATE:  2/28 ?REFERRING MD:  Wynetta Emery, CHIEF COMPLAINT:  Dyspnea  ? ?History of Present Illness:  ?76 yo male presented to [facility] with dyspnea for 3 days.  EMS called on 2/27 and he had SpO2 in the 80's on room air.  He declined option to come to ER then.  Symptoms got worse and he came to ER.  SpO2 in 70's on room air.  Found to have Rt sided pneumonia.  Started on IV fluids and antibiotics for sepsis, and Bipap with oxygen for respiratory failure.  Had persistent hypoxia and had CT angiogram showed acute saddle PE.  Transferred to [facility] for further management. ? ?Pertinent  Medical History  ?HTN, Depression, GERD ? ?Significant Hospital Events: ?Including procedures, antibiotic start and stop dates in addition to other pertinent events   ?2/28 admission, treatment for pneumonia with ceftriaxone/azithro, elevated troponin, treated with BIPAP ?3/1 TTE> LVEF 55-60%, mild LVH, D shaped septum, evidence of RV overload ?3/3 found to have saddle PE on CT angio, patchy ggo bilaterally; negative lower ext doppler, started on heparin ?3/4 moved to [facility] for persistent hypoxemia, remains on HHF ?3/5 no change in oxygen needs, given full dose TPA ?3/6 weaned O2 needs, tolerating diet. ?3/7 nephro consulted d/t rising cr. They felt mixed picture of Hypotension and Contrast dye injury.  Received Lasix IV. ?3/8 still on 50% heated high flow. Renal fxn worse.  Added cefepime.  For possible hcap, MRSA PCR again negative.  Bicarbonate supplementation started ?3/9: Renal function continues to worsen.  Urine output dropping.  Increasing bicarbonate, adding back gentle IV hydration.  No improvement in oxygen requirements ?3/10 on Precedex and BIPAP. BP boarderline hypotension, bradycardia all 2/2 dex. Increased edema on CXR. Place CVL for co-ox monitoring and CVP, creatinine hitting plateau and UOP picking up  ?3/13 Less  confused per nursing, continued  high oxygen demand ? ?Interim History / Subjective:  ?Bipap overnight. Now on high flow. C/o lower back pain. No respiratory distress. ? ?Objective   ?Blood pressure 130/64, pulse 96, temperature 99.1 ?F (37.3 ?C), temperature source Axillary, resp. rate (!) 21, height '5\' 5"'$  (1.651 m), weight 96.8 kg, SpO2 98 %. ?   ?FiO2 (%):  [45 %-80 %] 80 %  ? ?Intake/Output Summary (Last 24 hours) at 07/21/2021 0910 ?Last data filed at 07/21/2021 0800 ?Gross per 24 hour  ?Intake 39040.6 ml  ?Output 36100 ml  ?Net 2940.6 ml  ? ? ?Filed Weights  ? 07/18/21 0500 07/19/21 0344 07/20/21 0800  ?Weight: 101.3 kg 96.8 kg 96.8 kg  ? ? ?Examination: ?General:  obese elderly male ?Neuro:  Awake, alert, confused.  ?HEENT:  [place]/AT, PERRL, unable to appreciate jvd ?Cardiovascular:  RRR, no MRG ?Lungs:  Diminished ?Abdomen:  Soft, non-distended, non-tender  ?Musculoskeletal:  No acute deformity, no edema ?Skin:  Intact, MM dry. ? ?Na 150, K 3.5, Creat 0.96, Calcium 7.4 (corrects 9.3), phos 2.4, WBC 15.2, hgb 7.5 ? ?Resolved Hospital Problem list   ?Lactic acidosis, Elevated trop d/t right heart strain  ?Hypotension: levo DC 3/13 ? ?Assessment & Plan:  ? ?Acute respiratory failure with hypoxemia: initially admitted for pneumonia. Found to have submassive saddle PE. Required fibrinolytics. Course then complicated by HCAP. CXR now concerning for ARDS vs aspiration on BiPAP. ?- goal SpO2 > 92% ?- Heated high flow during waking hours. BiPAP QHS. Conitnue ?- Need to broaden antibiotics. CXR worse  in the setting of likely aspiration.  ?- Cultures negative ?- add unasyn ?- trial lasix ?- Continue heparin per pharmacy ?- Consider CT chest if he goes down for hematuria protocol.  ? ?AKI from contrast and post obstructive with bladder mass. ?Hypernatremia ?Hypokalemia ?- nephrology following ?- D5W 104m/hr ?- FWF 400cc q 4 ?- I&O reflecting CBI ? ?Acute urine retention, scrotal/penile edema, bladder mass, Rt renal mass,  hematuria. ?- urology following ?- CBI ongoing ?- Finasteride ?- May need fulguration or prostatic artery embolization.  ?- Urology recommending CT hematuria protocol. Nephrology OK with dye load if needed. I agree. The limiting factor may be his ability to lie flat with his significant degree of dyspnea and body habitus.  ? ?Acute metabolic encephalopathy from uremia. ?Chronic pain. ?- RASS goal 0 ?- continue buprenorphine patch ?- Precedex ?- Seroquel  ? ?Anemia of critical illness. ?- f/u CBC ?- transfuse for Hb < 7 ? ?Family updated via phone. Coming in later today to discuss [place]? ?Best Practice (right click and "Reselect all SmartList Selections" daily)  ? ?Diet/type: Regular consistency (see orders) and NPO ?DVT prophylaxis: systemic heparin ?GI prophylaxis: N/A ?Lines: N/A ?Foley:  N/A ?Code Status:  full code ?Last date of multidisciplinary goals of care discussion [several were held on 3/4.  See documentation] ?As of 3/10 remains critically ill w/ what seems like poor prognosis at best. He is debilitated, oxygen requirements still significant. I am concerned that he may improve some but not have the reserves to recover ? ?Labs:  ? ?CMP Latest Ref Rng & Units 07/21/2021 07/20/2021 07/20/2021  ?Glucose 70 - 99 mg/dL 172(H) 171(H) 156(H)  ?BUN 8 - 23 mg/dL '14 18 22  '$ ?Creatinine 0.61 - 1.24 mg/dL 0.96 1.22 1.54(H)  ?Sodium 135 - 145 mmol/L 150(H) 152(H) 156(H)  ?Potassium 3.5 - 5.1 mmol/L 3.5 4.3 2.8(L)  ?Chloride 98 - 111 mmol/L 116(H) 119(H) 117(H)  ?CO2 22 - 32 mmol/L '29 28 30  '$ ?Calcium 8.9 - 10.3 mg/dL 7.4(L) 7.4(L) 7.5(L)  ?Total Protein 6.5 - 8.1 g/dL - - -  ?Total Bilirubin 0.3 - 1.2 mg/dL - - -  ?Alkaline Phos 38 - 126 U/L - - -  ?AST 15 - 41 U/L - - -  ?ALT 0 - 44 U/L - - -  ? ? ?CBC Latest Ref Rng & Units 07/21/2021 07/20/2021 07/20/2021  ?WBC 4.0 - 10.5 K/uL 15.2(H) 13.1(H) 11.8(H)  ?Hemoglobin 13.0 - 17.0 g/dL 7.5(L) 7.3(L) 7.9(L)  ?Hematocrit 39.0 - 52.0 % 24.6(L) 24.5(L) 24.9(L)  ?Platelets 150 - 400  K/uL 320 299 293  ? ? ?ABG ?   ?Component Value Date/Time  ? PHART 7.268 (L) 07/16/2021 1159  ? PCO2ART 44.0 07/16/2021 1159  ? PO2ART 65 (L) 07/16/2021 1159  ? HCO3 20.0 07/16/2021 1159  ? TCO2 21 (L) 07/16/2021 1159  ? ACIDBASEDEF 6.0 (H) 07/16/2021 1159  ? O2SAT 78.3 07/17/2021 1257  ? ? ?CBG (last 3)  ?Recent Labs  ?  07/20/21 ?2344 07/21/21 ?0062603/14/23 ?09485 ?GLUCAP 166* 161* 134*  ? ? ? ?Critical care time: 48 minutes  ? ? ?PGeorgann Housekeeper AGACNP-BC ?[facility] ? ?See Amion for personal pager ?PCCM on call pager ([phone] 7pm. ?Please call Elink 7p-7a. [phone]? ?07/21/2021 9:11 AM ? ? ? ? ? ? ? ? ? ?

## 2021-07-21 NOTE — Progress Notes (Signed)
eLink Physician-Brief Progress Note ?Patient Name: Jason Holt. ?DOB: 1946-05-03 ?MRN: [id number] ? ? ?Date of Service ? 07/21/2021  ?HPI/Events of Note ? K+ 3.4  ?eICU Interventions ? KCL replaced per E-Link electrolyte replacement protocol.  ? ? ? ?  ? ?Frederik Pear ?07/21/2021, 8:02 PM ?

## 2021-07-21 NOTE — Progress Notes (Signed)
Patient transported from CT scan to 4N23. ?

## 2021-07-21 NOTE — Progress Notes (Signed)
ANTICOAGULATION CONSULT NOTE ? ?Pharmacy Consult for heparin ?Indication: pulmonary embolus ? ?Allergies  ?Allergen Reactions  ? Influenza Virus Vacc Split Pf Hives  ? Dye Fdc Red [Red Dye]   ? ? ?Patient Measurements: ?Height: '5\' 5"'$  (165.1 cm) ?Weight: 96.8 kg (213 lb 6.5 oz) ?IBW/kg (Calculated) : 61.5 ?HEPARIN DW (KG): 82.9  ? ?Labs: ?Recent Labs  ?  07/19/21 ?0708 07/19/21 ?1217 07/19/21 ?1809 07/20/21 ?1610 07/20/21 ?0601 07/20/21 ?1624 07/21/21 ?0100  ?HGB  --    < > 8.4*  --  7.9* 7.3*  --   ?HCT  --    < > 26.9*  --  24.9* 24.5*  --   ?PLT  --   --  334  --  293 299  --   ?HEPARINUNFRC  --   --   --  0.70  --  0.72* 0.12*  ?CREATININE 2.50*  --   --  1.54*  --  1.22  --   ? < > = values in this interval not displayed.  ? ? ? ?Estimated Creatinine Clearance: 54.2 mL/min (by C-G formula based on SCr of 1.22 mg/dL). ? ? ?Assessment: ?17 YOM admitted for SOB, found to have acute PE with evidence of RHS (RV/LV ratio = 1.3) consistent with at least submassive PE. Patient transferred from [facility] to [facility] for possible IR intervention vs. thrombolytic. Patient is s/p systemic tPA on 3/5 and heparin was resumed.  ? ?New hematuria with clots noted on 3/11, and Urology irrigating bladder. Hematuria improving, noo further clots per RN. Hemoglobin trended down, s/p transfusion 3/12, stable today. Plt WNL. No issues with infusion per discussion with RN. ? ?Heparin level continuing to trend up to 0.72 units/mL even with rate decrease.  ? ?3/14 AM update:  ?Heparin level low ? ?Goal of Therapy:  ?Heparin level 0.3-0.5 units/mL with new hematuria ?Monitor platelets by anticoagulation protocol: Yes ?  ?Plan:  ?Inc heparin to 1550 units/hr ?1000 heparin level ?Watch Hgb closely in setting of some hematuria ? ?Narda Bonds, PharmD, BCPS ?Clinical Pharmacist ?Phone: [phone] ? ? ?

## 2021-07-21 NOTE — Progress Notes (Signed)
PT Cancellation Note ? ?Patient Details ?Name: Jason Holt. ?MRN: [id number] ?DOB: [birth year] ? ? ?Cancelled Treatment:    Reason Eval/Treat Not Completed: Medical issues which prohibited therapy (RN requested therapy hold, pt on BiPAP, goals of care meeting with family today at 2:30.) PT will follow as appropriate.  ? ?Leighton Roach, PT  ?Acute Rehab Services ? Pager [phone] ?Office [phone] ? ? ? ?[place] ?07/21/2021, 1:08 PM ?

## 2021-07-21 NOTE — Progress Notes (Signed)
Patient transported to CT scan . 

## 2021-07-21 NOTE — Progress Notes (Signed)
?[facility] ?Progress Note  ? ?Subjective:   calmer today but cont on high flow O2.  CXR yesterday worse aeration of R upper and lower lobes.  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  CBI still running.  RFP showing Cr now normal.  ? ?Objective ?Vitals:  ? 07/21/21 0847 07/21/21 0900 07/21/21 1000 07/21/21 1010  ?BP:  (!) 138/57 (!) 156/69 (!) 156/69  ?Pulse: 96 89 88 87  ?Resp: (!) 21 (!) 29 (!) 27 (!) 33  ?Temp:      ?TempSrc:      ?SpO2: 98% 100% 92% 97%  ?Weight:      ?Height:      ? ?Physical Exam ?General: on bipap, sleepy ?Heart: RRR, no rub ?Lungs: clear ant ?Abdomen: soft, obese, reducible hernias, no bladder TTP ?Extremities: trace edema ?GU: CBI with blood tinged colored urine in bag ? ?Additional Objective ?Labs: ?Basic Metabolic Panel: ?Recent Labs  ?Lab 07/18/21 ?0201 07/18/21 ?0345 07/19/21 ?0708 07/20/21 ?2542 07/20/21 ?1624 07/21/21 ?7062  ?NA 148* 154*   < > 156* 152* 150*  ?K 4.6 3.1*   < > 2.8* 4.3 3.5  ?CL 114* 120*   < > 117* 119* 116*  ?CO2 21* 26   < > '30 28 29  '$ ?GLUCOSE 143* 145*   < > 156* 171* 172*  ?BUN 64* 43*   < > '22 18 14  '$ ?CREATININE 4.54* 2.77*   < > 1.54* 1.22 0.96  ?CALCIUM 8.2* 7.7*   < > 7.5* 7.4* 7.4*  ?PHOS 7.9* 4.8*  --   --   --  2.4*  ? < > = values in this interval not displayed.  ? ? ?Liver Function Tests: ?Recent Labs  ?Lab 07/21/21 ?3762  ?ALBUMIN 1.6*  ? ?No results for input(s): LIPASE, AMYLASE in the last 168 hours. ?CBC: ?Recent Labs  ?Lab 07/18/21 ?0345 07/19/21 ?1217 07/19/21 ?1809 07/20/21 ?0601 07/20/21 ?1624 07/21/21 ?8315  ?WBC 14.2*  --  14.6* 11.8* 13.1* 15.2*  ?HGB 6.8*   < > 8.4* 7.9* 7.3* 7.5*  ?HCT 22.2*   < > 26.9* 24.9* 24.5* 24.6*  ?MCV 88.4  --  90.0 89.2 92.5 92.1  ?PLT 297  --  334 293 299 320  ? < > = values in this interval not displayed.  ? ? ?Blood Culture ?   ?Component Value Date/Time  ? SDES BLOOD  RIGHT ANTECUBITAL 06/20/2021 0951  ? SPECREQUEST  07/01/2021 0951  ?  BOTTLES DRAWN AEROBIC AND ANAEROBIC Blood Culture adequate volume  ? CULT  06/11/2021 0951  ?  NO GROWTH 6 DAYS ?Performed at [facility], [address] [place], [place] [address] ?  ? REPTSTATUS 07/13/2021 FINAL 07/04/2021 0951  ? ? ?Cardiac Enzymes: ?Recent Labs  ?Lab 07/14/21 ?1300  ?CKTOTAL 51  ? ? ?CBG: ?Recent Labs  ?Lab 07/20/21 ?1546 07/20/21 ?1934 07/20/21 ?2344 07/21/21 ?0737 07/21/21 ?1062  ?GLUCAP 158* 153* 166* 161* 134*  ? ? ?Iron Studies: No results for input(s): IRON, TIBC, TRANSFERRIN, FERRITIN in the last 72 hours. ?'@lablastinr3'$ @ ?Studies/Results: ?DG Chest Port 1 View ? ?Result Date: 07/20/2021 ?CLINICAL DATA:  Respiratory failure. EXAM: PORTABLE CHEST 1 VIEW COMPARISON:  07/18/21 FINDINGS: There is a left subclavian central venous catheter with tip in the projection of the distal SVC. Heart size and mediastinal contours are stable. Bilateral interstitial and airspace opacities are  again noted. Compared with the previous exam there is been interval worsening aeration to the right upper lobe and right base. IMPRESSION: 1. Worsening aeration to the right upper lobe and right base. 2. Stable appearance of bilateral interstitial and airspace opacities. Electronically Signed   By: Kerby Moors M.D.   On: 07/20/2021 07:52  ? ?DG Abd Portable 1V ? ?Result Date: 07/20/2021 ?CLINICAL DATA:  Tube placement EXAM: PORTABLE ABDOMEN - 1 VIEW COMPARISON:  None. FINDINGS: Nonspecific paucity of bowel gas. Opacities in bilateral lung bases, better characterized on same day chest radiograph. Enteric tube courses below the diaphragm with the tip in the Peri pyloric region. Degenerative changes of the spine. IMPRESSION: Enteric tube courses below the diaphragm with the tip in the peripyloric region. Electronically Signed   By: Margaretha Sheffield M.D.   On: 07/20/2021 10:00   ?Medications: ? sodium chloride 10 mL/hr at 07/19/21 1121  ?  dexmedetomidine (PRECEDEX) IV infusion 1 mcg/kg/hr (07/21/21 1000)  ? dextrose 75 mL/hr at 07/21/21 1000  ? feeding supplement (VITAL 1.5 CAL) 1,000 mL (07/21/21 0925)  ? heparin 1,550 Units/hr (07/21/21 1000)  ? sodium chloride irrigation    ? sodium chloride irrigation    ? ? sodium chloride   Intravenous Once  ? buprenorphine  1 patch Transdermal Weekly  ? Chlorhexidine Gluconate Cloth  6 each Topical Q0600  ? feeding supplement (PROSource TF)  45 mL Per Tube BID  ? finasteride  5 mg Oral Daily  ? free water  400 mL Per Tube Q4H  ? furosemide  40 mg Intravenous Once  ? lidocaine  1 application. Urethral Once  ? mouth rinse  15 mL Mouth Rinse BID  ? pantoprazole (PROTONIX) IV  40 mg Intravenous Q24H  ? potassium & sodium phosphates  1 packet Per Tube Q6H  ? potassium chloride  40 mEq Per Tube Q4H  ? QUEtiapine  50 mg Per Tube QHS  ? ? ? ?Assessment/Plan: ?**hypoxic respiratory failure:  PNA then saddle PE. S/p TPA.  Remains on HFNC awake, bipap while sleeping.  ? ?**AKI, severe:  last documented Cr 0.8 in 05/2019.  On presentation 2.2 2/28, severe AKI following PE/contrast/RV compromise, also was having urinary retention--> Cr peak 5 on 3/10.  Renal US 3/3 no obstruction (?R mass, see below), though developed retention later.  UA trace protein o/w bland.  Renal function is improving daily and is now normal.  Per below may need iodinated contrast load today -- he's volume expanded and although there is a risk for AKI I think since renal function has normalized it's reasonable to do the scan now if needed. ? ?**Hypernatremia:  slowly improving on D5W 75/hr and FWF 200 Q4 to 400 Q4.  Primary can follow and adjust.  ? ?**Hypokalemia: resolved after repletion. ? ?**Anemia:  Hb 7s, transfuse PRN. ? ?**Hematuria, bladder mass, R renal mass:  urology is following - CBI currently, finasteride/  Seems to be improving now.  Further imaging for mass planned. At this point he looks euvolemic and has IVF running - I think if  it's necessary to do a contrasted CT to define mass that would be acceptable - certainly risk for AKI but ok to do if necessary.   ? ?Nephrology will sign off at this time given normalization of renal function.   ?Grandson expressing reasonable concern for trajectory - plans to engage with family and care team.  ?Call us back if we can further assist.  ? ?Jannifer Hick MD ?07/21/2021,  10:32 AM  ?[facility] ?Pager: [phone] ? ? ?

## 2021-07-22 ENCOUNTER — Inpatient Hospital Stay (HOSPITAL_COMMUNITY): Payer: No Typology Code available for payment source

## 2021-07-22 DIAGNOSIS — J90 Pleural effusion, not elsewhere classified: Secondary | ICD-10-CM | POA: Diagnosis not present

## 2021-07-22 DIAGNOSIS — N179 Acute kidney failure, unspecified: Secondary | ICD-10-CM | POA: Diagnosis not present

## 2021-07-22 DIAGNOSIS — I2602 Saddle embolus of pulmonary artery with acute cor pulmonale: Secondary | ICD-10-CM | POA: Diagnosis not present

## 2021-07-22 DIAGNOSIS — J9601 Acute respiratory failure with hypoxia: Secondary | ICD-10-CM | POA: Diagnosis not present

## 2021-07-22 DIAGNOSIS — G9341 Metabolic encephalopathy: Secondary | ICD-10-CM | POA: Diagnosis not present

## 2021-07-22 LAB — BASIC METABOLIC PANEL
Anion gap: 8 (ref 5–15)
BUN: 11 mg/dL (ref 8–23)
CO2: 30 mmol/L (ref 22–32)
Calcium: 7.2 mg/dL — ABNORMAL LOW (ref 8.9–10.3)
Chloride: 109 mmol/L (ref 98–111)
Creatinine, Ser: 0.88 mg/dL (ref 0.61–1.24)
GFR, Estimated: >60 mL/min (ref >60)
Glucose, Bld: 161 mg/dL — ABNORMAL HIGH (ref 70–99)
Potassium: 3.4 mmol/L — ABNORMAL LOW (ref 3.5–5.1)
Sodium: 147 mmol/L — ABNORMAL HIGH (ref 135–145)

## 2021-07-22 LAB — POCT I-STAT 7, (LYTES, BLD GAS, ICA,H+H)
Acid-Base Excess: 6 mmol/L — ABNORMAL HIGH (ref 0.0–2.0)
Bicarbonate: 30.8 mmol/L — ABNORMAL HIGH (ref 20.0–28.0)
Calcium, Ion: 1.1 mmol/L — ABNORMAL LOW (ref 1.15–1.40)
HCT: 28 % — ABNORMAL LOW (ref 39.0–52.0)
Hemoglobin: 9.5 g/dL — ABNORMAL LOW (ref 13.0–17.0)
O2 Saturation: 97 %
Patient temperature: 99.9
Potassium: 3.5 mmol/L (ref 3.5–5.1)
Sodium: 148 mmol/L — ABNORMAL HIGH (ref 135–145)
TCO2: 32 mmol/L (ref 22–32)
pCO2 arterial: 47.8 mmHg (ref 32–48)
pH, Arterial: 7.42 (ref 7.35–7.45)
pO2, Arterial: 89 mmHg (ref 83–108)

## 2021-07-22 LAB — CBC
HCT: 24.7 % — ABNORMAL LOW (ref 39.0–52.0)
Hemoglobin: 7.5 g/dL — ABNORMAL LOW (ref 13.0–17.0)
MCH: 28.4 pg (ref 26.0–34.0)
MCHC: 30.4 g/dL (ref 30.0–36.0)
MCV: 93.6 fL (ref 80.0–100.0)
Platelets: 344 10*3/uL (ref 150–400)
RBC: 2.64 MIL/uL — ABNORMAL LOW (ref 4.22–5.81)
RDW: 16.4 % — ABNORMAL HIGH (ref 11.5–15.5)
WBC: 14.5 10*3/uL — ABNORMAL HIGH (ref 4.0–10.5)
nRBC: 0.2 % (ref 0.0–0.2)

## 2021-07-22 LAB — HEMOGLOBIN AND HEMATOCRIT, BLOOD
HCT: 24.9 % — ABNORMAL LOW (ref 39.0–52.0)
HCT: 26.2 % — ABNORMAL LOW (ref 39.0–52.0)
Hemoglobin: 7.5 g/dL — ABNORMAL LOW (ref 13.0–17.0)
Hemoglobin: 8 g/dL — ABNORMAL LOW (ref 13.0–17.0)

## 2021-07-22 LAB — HEPARIN LEVEL (UNFRACTIONATED): Heparin Unfractionated: 0.54 IU/mL (ref 0.30–0.70)

## 2021-07-22 LAB — GLUCOSE, CAPILLARY
Glucose-Capillary: 138 mg/dL — ABNORMAL HIGH (ref 70–99)
Glucose-Capillary: 152 mg/dL — ABNORMAL HIGH (ref 70–99)
Glucose-Capillary: 152 mg/dL — ABNORMAL HIGH (ref 70–99)
Glucose-Capillary: 152 mg/dL — ABNORMAL HIGH (ref 70–99)
Glucose-Capillary: 156 mg/dL — ABNORMAL HIGH (ref 70–99)
Glucose-Capillary: 188 mg/dL — ABNORMAL HIGH (ref 70–99)

## 2021-07-22 LAB — PREPARE RBC (CROSSMATCH)

## 2021-07-22 LAB — PHOSPHORUS: Phosphorus: 2.2 mg/dL — ABNORMAL LOW (ref 2.5–4.6)

## 2021-07-22 LAB — MAGNESIUM: Magnesium: 2 mg/dL (ref 1.7–2.4)

## 2021-07-22 MED ORDER — TIZANIDINE HCL 4 MG PO TABS
4.0000 mg | ORAL_TABLET | Freq: Three times a day (TID) | ORAL | Status: DC | PRN
  Administered 2021-07-22 – 2021-07-23 (×2): 4 mg
  Filled 2021-07-22 (×3): qty 1

## 2021-07-22 MED ORDER — POTASSIUM PHOSPHATES 15 MMOLE/5ML IV SOLN
30.0000 mmol | Freq: Once | INTRAVENOUS | Status: AC
  Administered 2021-07-22: 30 mmol via INTRAVENOUS
  Filled 2021-07-22: qty 10

## 2021-07-22 MED ORDER — FUROSEMIDE 10 MG/ML IJ SOLN
40.0000 mg | Freq: Once | INTRAMUSCULAR | Status: AC
  Administered 2021-07-22: 40 mg via INTRAVENOUS
  Filled 2021-07-22: qty 4

## 2021-07-22 MED ORDER — SODIUM CHLORIDE 0.9% IV SOLUTION: Freq: Once | INTRAVENOUS | Status: AC

## 2021-07-22 MED ORDER — PANTOPRAZOLE 2 MG/ML SUSPENSION
40.0000 mg | Freq: Every day | ORAL | Status: DC
  Administered 2021-07-23 – 2021-07-24 (×2): 40 mg
  Filled 2021-07-22 (×2): qty 20

## 2021-07-22 MED ORDER — LIDOCAINE 5 % EX PTCH
2.0000 | MEDICATED_PATCH | CUTANEOUS | Status: DC
  Administered 2021-07-22 – 2021-07-24 (×3): 2 via TRANSDERMAL
  Filled 2021-07-22 (×3): qty 2

## 2021-07-22 MED ORDER — PANTOPRAZOLE 2 MG/ML SUSPENSION: 40.0000 mg | Freq: Every day | ORAL | Status: DC

## 2021-07-22 NOTE — Progress Notes (Addendum)
ANTICOAGULATION CONSULT NOTE ? ?Pharmacy Consult for heparin ?Indication: pulmonary embolus ? ?Allergies  ?Allergen Reactions  ? Influenza Virus Vacc Split Pf Hives  ? Dye Fdc Red [Red Dye]   ? ? ?Patient Measurements: ?Height: '5\' 5"'$  (165.1 cm) ?Weight: 90.1 kg (198 lb 10.2 oz) ?IBW/kg (Calculated) : 61.5 ?HEPARIN DW (KG): 82.9  ? ?Labs: ?Recent Labs  ?  07/21/21 ?4332 07/21/21 ?9518 07/21/21 ?1534 07/21/21 ?1548 07/21/21 ?1727 07/21/21 ?1748 07/22/21 ?8416 07/22/21 ?6063  ?HGB 7.5*  --    < > 7.7* 7.8*  --  9.5* 7.5*  ?HCT 24.6*  --    < > 25.1* 23.0*  --  28.0* 24.7*  ?PLT 320  --   --  326  --   --   --  344  ?HEPARINUNFRC 0.45 0.52  --   --   --   --   --  0.54  ?CREATININE 0.96  --   --   --   --  0.97  --  0.88  ? < > = values in this interval not displayed.  ? ? ? ?Estimated Creatinine Clearance: 72.5 mL/min (by C-G formula based on SCr of 0.88 mg/dL). ? ? ?Assessment: ?76 YOM admitted for SOB, found to have acute PE with evidence of RHS (RV/LV ratio = 1.3) consistent with at least submassive PE. Patient transferred from [facility] to [facility] for possible IR intervention vs. thrombolytic. Patient is s/p systemic tPA on 3/5 and heparin was resumed.  ? ?New hematuria with clots noted on 3/11, and Urology irrigating bladder. Hematuria improving, no further clots per RN. Hemoglobin trended down, s/p transfusion 3/12 - stable today, plt wnl. ? ?Heparin level remains very slightly supratherapeutic for lower goal 0.3-0.5 on heparin at 1550 units/hr. Level was previously subtherapeutic on 1500 units/hr. Will leave for now since still in normal therapeutic range and would rather patient be therapeutic with acute PE vs. low. Will f/u trend tomorrow and possibly reduce rate as indicated. Hematuria improved on CBI. ? ?Goal of Therapy:  ?Heparin level 0.3-0.5 units/mL with new hematuria ?Monitor platelets by anticoagulation protocol: Yes ?  ?Plan:  ?Continue heparin at 1550 units/hr for now ?Recheck heparin level with AM labs, daily  CBC ?Watch Hgb closely in setting of hematuria (improving) ? ? ?Arturo Morton, PharmD, BCPS ?Please check AMION for all [place] contact numbers ?Clinical Pharmacist ?07/22/2021 11:37 AM ? ?ADDENDUM - thoracentesis performed with blood-tinged fluid drained from pleural space. Will reduce heparin drip rate slightly to 1500 units/hr for lower goal 0.3-0.5 and f/u repeat level with AM labs. ? ? ?Arturo Morton, PharmD, BCPS ?Please check AMION for all [place] contact numbers ?Clinical Pharmacist ?07/22/2021 12:51 PM ?

## 2021-07-22 NOTE — Procedures (Signed)
Bedside chest ultrasound: ? ?Showing moderate amount of right-sided pleural effusion with atelectasis ? ? ? ?

## 2021-07-22 NOTE — Procedures (Signed)
Thoracentesis  Procedure Note ? ?Jason Holt Jason Holt.  ?[id number]  ?05/07/1946 ? ?Date:07/22/21  ?Time:12:13 PM  ? ?Provider Performing:Kyrillos Adams  ? ?Procedure: Thoracentesis with imaging guidance (21194) ? ?Indication(s) ?Pleural Effusion ? ?Consent ?Risks of the procedure as well as the alternatives and risks of each were explained to the patient and/or caregiver.  Consent for the procedure was obtained and is signed in the bedside chart ? ?Anesthesia ?Local 1% lidocaine ?Systemic fentanyl gtt  ? ?Time Out ?Verified patient identification, verified procedure, site/side was marked, verified correct patient position, special equipment/implants available, medications/allergies/relevant history reviewed, required imaging and test results available. ? ? ?Sterile Technique ?Maximal sterile technique including full sterile barrier drape, hand hygiene, sterile gown, sterile gloves, mask, hair covering, sterile ultrasound probe cover (if used). ? ?Procedure Description ?Ultrasound was used to identify appropriate pleural anatomy for placement and overlying skin marked.  Area of drainage cleaned and draped in sterile fashion. Lidocaine was used to anesthetize the skin and subcutaneous tissue.  600 cc's of blood-tinged appearing fluid was drained from the right pleural space. Catheter then removed and bandaid applied to site. ? ? ?Complications/Tolerance ?None; patient tolerated the procedure well. ?Chest X-ray is ordered to confirm no post-procedural complication. ? ? ?EBL ?Minimal ? ? ?Specimen(s) ?Pleural fluid ? ? ? ? ? ? ? ? ? ? ?

## 2021-07-22 NOTE — Progress Notes (Signed)
PT Cancellation Note ? ?Patient Details ?Name: Jason Holt. ?MRN: [id number] ?DOB: August 02, 1945 ? ? ?Cancelled Treatment:    Reason Eval/Treat Not Completed: Medical issues which prohibited therapy. Pt intubated and sedate, currently receiving blood products. PT will attempt to follow up tomorrow if pt is able to wean sedation. ? ? ?Zenaida Niece ?07/22/2021, 3:36 PM ?

## 2021-07-22 NOTE — Progress Notes (Signed)
OT Cancellation Note ? ?Patient Details ?Name: Jason Holt. ?MRN: [id number] ?DOB: 07-11-1945 ? ? ?Cancelled Treatment:    Reason Eval/Treat Not Completed: Medical issues which prohibited therapy.  Patient intubated, sedated and receiving blood products.  OT to continue efforts as appropriate.   ? ?Corynn Solberg D Daven Montz ?07/22/2021, 4:04 PM ?

## 2021-07-22 NOTE — Progress Notes (Signed)
Late note entry. ? ?Subjective: ?Intubated, sedated, confused. ? ?Objective: ?Vital signs in last 24 hours: ?Temp:  [98.8 ?F (37.1 ?C)-100.2 ?F (37.9 ?C)] 99.9 ?F (37.7 ?C) (03/15 1600) ?Pulse Rate:  [61-102] 73 (03/15 1956) ?Resp:  [22-31] 24 (03/15 1956) ?BP: (87-151)/(44-72) 108/51 (03/15 1900) ?SpO2:  [93 %-100 %] 94 % (03/15 1956) ?FiO2 (%):  [40 %] 40 % (03/15 1956) ?Weight:  [90.1 kg] 90.1 kg (03/15 0440) ? ?Intake/Output from previous day: ?03/14 0701 - 03/15 0700 ?In: 13159.6 [I.V.:2939.5; NG/GT:3720; IV Piggyback:500.1] ?Out: 16384 [TXMIW:80321] ?Intake/Output this shift: ?No intake/output data recorded. ? ?Physical Exam:  ?Abdomen: Soft, ND, NT ?GU: Foley clear, no hematuria ? ?Lab Results: ?Recent Labs  ?  07/22/21 ?2248 07/22/21 ?1240 07/22/21 ?1739  ?HGB 7.5* 7.5* 8.0*  ?HCT 24.7* 24.9* 26.2*  ? ?BMET ?Recent Labs  ?  07/21/21 ?1748 07/22/21 ?0343 07/22/21 ?0442  ?NA 148* 148* 147*  ?K 3.4* 3.5 3.4*  ?CL 112*  --  109  ?CO2 31  --  30  ?GLUCOSE 200*  --  161*  ?BUN 13  --  11  ?CREATININE 0.97  --  0.88  ?CALCIUM 7.2*  --  7.2*  ? ? ? ?Studies/Results: ?CT CHEST WO CONTRAST ? ?Result Date: 07/21/2021 ?CLINICAL DATA:  Pneumonia. Complication suspected. Respiratory distress. On ventilator. EXAM: CT CHEST WITHOUT CONTRAST TECHNIQUE: Multidetector CT imaging of the chest was performed following the standard protocol without IV contrast. RADIATION DOSE REDUCTION: This exam was performed according to the departmental dose-optimization program which includes automated exposure control, adjustment of the mA and/or kV according to patient size and/or use of iterative reconstruction technique. COMPARISON:  AP chest 07/21/2021, 07/20/2021, 07/18/2021, 07/08/2021; CTA chest 07/10/2021 FINDINGS: An endotracheal tube terminates approximately 3.3 cm above the carina. An enteric tube courses into the stomach with the distal tip excluded by collimation. Left subclavian approach central venous catheter tip overlies the  central superior vena cava. Cardiovascular: Heart size is moderately enlarged, unchanged. Dense coronary artery and aortic root calcifications are seen. No thoracic aortic aneurysm. Mild calcifications within the thoracic aorta. Note is made that extensive bilateral pulmonary emboli were seen on contrasted 07/10/2021 CT. Lack of IV contrast precludes evaluation of these prior emboli. The main pulmonary artery measures up to 3.3 cm in caliber again enlarged as can be seen with chronic pulmonary arterial hypertension. Mediastinum/Nodes: No axillary mediastinal or hilar pathologically enlarged lymph nodes by CT criteria. Subcentimeter short axis right paratracheal lymph nodes are noted. The visualized thyroid is grossly unremarkable. The esophagus follows a normal course. Lungs/Pleura: The central airways are patent. Interval increase in now moderate right and small left pleural effusions. Interval worsening in now moderate to high-grade patchy bilateral ground-glass opacities and mild bilateral consolidative opacities. No pneumothorax. Upper Abdomen: No acute abnormality. Musculoskeletal: Mild-to-moderate multilevel degenerative disc changes of the thoracic spine. IMPRESSION:: IMPRESSION: 1. Endotracheal tube in appropriate position. 2. Moderate cardiomegaly. 3. Interval increase in bilateral ground-glass lung opacities and mild consolidative opacities suspicious for multifocal infection. 4. Interval increase in now moderate right and small left pleural effusions. 5. Note is made of bilateral pulmonary emboli on the prior contrasted CT 07/10/2021. Lack of IV contrast on the current study precludes evaluation of the prior emboli. Aortic Atherosclerosis (ICD10-I70.0). Electronically Signed   By: Yvonne Kendall M.D.   On: 07/21/2021 20:30  ? ?DG CHEST PORT 1 VIEW ? ?Result Date: 07/22/2021 ?CLINICAL DATA:  Follow-up pneumothorax EXAM: PORTABLE CHEST 1 VIEW COMPARISON:  Chest radiograph performed earlier on  the same date at  12:27 p.m. FINDINGS: Endotracheal tube with distal tip in the midtrachea. Feeding tube coursing below the diaphragm with distal tip not included. Left approach central venous catheter with distal tip overlies the SVC. The heart is enlarged. Diffuse interstitial and bilateral airspace opacities, unchanged. Small right apical pneumothorax is unchanged. No acute osseous abnormality. IMPRESSION: 1.  Stable right apical pneumothorax. 2.  Lines and tubes as above and unchanged. 3.  Bilateral interstitial and airspace opacities, unchanged. Electronically Signed   By: Keane Police D.O.   On: 07/22/2021 17:41  ? ?DG CHEST PORT 1 VIEW ? ?Result Date: 07/22/2021 ?CLINICAL DATA:  Status post right thoracentesis EXAM: PORTABLE CHEST 1 VIEW COMPARISON:  Radiograph 07/22/2021 FINDINGS: Endotracheal tube tip overlies the midthoracic trachea. Left approach central venous catheter tip overlies the distal superior vena cava. Feeding tube tip passes below the diaphragm, tip excluded by collimation. Diffuse interstitial and bilateral airspace disease, ostium unchanged. Decreased right pleural effusion. There is a right-sided pneumothorax measuring up to 1.5 cm. Thoracic spondylosis. No acute osseous abnormality. IMPRESSION: Decreased right pleural effusion after thoracentesis. New small right-sided pneumothorax. Stable left pleural effusion. No significant change in diffuse interstitial and airspace opacities compatible with pneumonia and/or edema. These results were called by telephone at the time of interpretation on 07/22/2021 at 12:54 pm to provider [facility] , who verbally acknowledged these results. Electronically Signed   By: Maurine Simmering M.D.   On: 07/22/2021 12:56  ? ?DG Chest Port 1 View ? ?Result Date: 07/22/2021 ?CLINICAL DATA:  Pneumonia/ETT EXAM: PORTABLE CHEST 1 VIEW COMPARISON:  07/21/2021 FINDINGS: Endotracheal tube overlies the midthoracic trachea. Left extremity PICC tip overlies the superior cavoatrial junction.  Feeding tube tip overlies the stomach. Unchanged cardiomediastinal silhouette. Persistent diffuse interstitial and bilateral airspace disease, not significant changed from prior. Small bilateral pleural effusions. No visible pneumothorax. No acute osseous abnormality. IMPRESSION: Endotracheal tube tip overlies the midthoracic trachea. No significant change in interstitial and multifocal airspace disease bilaterally consistent with pneumonia and/or edema. Electronically Signed   By: Maurine Simmering M.D.   On: 07/22/2021 08:26  ? ?DG CHEST PORT 1 VIEW ? ?Result Date: 07/21/2021 ?CLINICAL DATA:  Intubation. EXAM: PORTABLE CHEST 1 VIEW COMPARISON:  07/20/2021 FINDINGS: The endotracheal tube is 3.5 cm above the carina. A feeding tube is coursing down the esophagus and into the stomach. The left subclavian central venous catheter is stable. Slight improved aeration following intubation but persistent interstitial and airspace process in the lungs along with bilateral pleural effusions. IMPRESSION: 1. Endotracheal tube in good position, 3.5 cm above the carina. 2. Slight improved aeration following intubation. 3. Persistent bilateral infiltrates and effusions. Electronically Signed   By: Marijo Sanes M.D.   On: 07/21/2021 15:20  ? ?CT HEMATURIA WORKUP ? ?Result Date: 07/22/2021 ?CLINICAL DATA:  Acute urinary retention, Foley catheter in place. Traumatic hematuria EXAM: CT ABDOMEN AND PELVIS WITHOUT AND WITH CONTRAST TECHNIQUE: Multidetector CT imaging of the abdomen and pelvis was performed following the standard protocol before and following the bolus administration of intravenous contrast. RADIATION DOSE REDUCTION: This exam was performed according to the departmental dose-optimization program which includes automated exposure control, adjustment of the mA and/or kV according to patient size and/or use of iterative reconstruction technique. CONTRAST:  119m OMNIPAQUE IOHEXOL 300 MG/ML  SOLN COMPARISON:  CT chest 07/21/2021,  renal ultrasound 07/17/2021 and CT abdomen pelvis 08/30/2016. FINDINGS: Note: Image quality in the abdomen is degraded by streak artifact from overlying support apparatuses. Lower chest:  Moderate to large

## 2021-07-22 NOTE — Progress Notes (Signed)
? ?NAME:  Jason Holt., MRN:  [id number], DOB:  1946/04/04, LOS: 15 ?ADMISSION DATE:  06/28/2021, CONSULTATION DATE:  2/28 ?REFERRING MD:  Wynetta Emery, CHIEF COMPLAINT:  Dyspnea  ? ?History of Present Illness:  ?76 yo male presented to [facility] with dyspnea for 3 days.  EMS called on 2/27 and he had SpO2 in the 80's on room air.  He declined option to come to ER then.  Symptoms got worse and he came to ER.  SpO2 in 70's on room air.  Found to have Rt sided pneumonia.  Started on IV fluids and antibiotics for sepsis, and Bipap with oxygen for respiratory failure.  Had persistent hypoxia and had CT angiogram showed acute saddle PE.  Transferred to [facility] for further management. ? ?Pertinent  Medical History  ?HTN, Depression, GERD ? ?Significant Hospital Events: ?Including procedures, antibiotic start and stop dates in addition to other pertinent events   ?2/28 admission, treatment for pneumonia with ceftriaxone/azithro, elevated troponin, treated with BIPAP ?3/1 TTE> LVEF 55-60%, mild LVH, D shaped septum, evidence of RV overload ?3/3 found to have saddle PE on CT angio, patchy ggo bilaterally; negative lower ext doppler, started on heparin ?3/4 moved to [facility] for persistent hypoxemia, remains on HHF ?3/5 no change in oxygen needs, given full dose TPA ?3/6 weaned O2 needs, tolerating diet. ?3/7 nephro consulted d/t rising cr. They felt mixed picture of Hypotension and Contrast dye injury.  Received Lasix IV. ?3/8 still on 50% heated high flow. Renal fxn worse.  Added cefepime.  For possible hcap, MRSA PCR again negative.  Bicarbonate supplementation started ?3/9: Renal function continues to worsen.  Urine output dropping.  Increasing bicarbonate, adding back gentle IV hydration.  No improvement in oxygen requirements ?3/10 on Precedex and BIPAP. BP boarderline hypotension, bradycardia all 2/2 dex. Increased edema on CXR. Place CVL for co-ox monitoring and CVP, creatinine hitting plateau and UOP picking up  ?3/13 Less  confused per nursing, continued  high oxygen demand ?3/14 [place] mtg with family -- changed from DNR to partial and wanted elective intubation with worsening resp failure ?3/15 CT hematuria -- R renal mass, pancreatic head/neck mass, bladder mass. Cirrhosis.   ? ?Interim History / Subjective:  ?Intubated yesterday ? ?This morning having a bad back spasm. Repositioned w the RN for side lying position  ? ?Objective   ?Blood pressure (!) 124/44, pulse 71, temperature 99.9 ?F (37.7 ?C), temperature source Axillary, resp. rate (!) 26, height '5\' 5"'$  (1.651 m), weight 90.1 kg, SpO2 100 %. ?   ?Vent Mode: PRVC ?FiO2 (%):  [40 %-100 %] 40 % ?Set Rate:  [24 bmp] 24 bmp ?Vt Set:  [490 mL] 490 mL ?PEEP:  [8 cmH20-12 cmH20] 8 cmH20 ?Pressure Support:  [8 cmH20] 8 cmH20 ?Plateau Pressure:  [15 cmH20-29 cmH20] 22 cmH20  ? ?Intake/Output Summary (Last 24 hours) at 07/22/2021 0920 ?Last data filed at 07/22/2021 0800 ?Gross per 24 hour  ?Intake 12637.93 ml  ?Output 10025 ml  ?Net 2612.93 ml  ? ?Filed Weights  ? 07/19/21 0344 07/20/21 0800 07/22/21 0440  ?Weight: 96.8 kg 96.8 kg 90.1 kg  ? ? ?Examination: ?General:  Chronically and critically ill appearing elderly M intubated lightly sedated NAD  ?Neuro:  awake alert following commands  ?HEENT: NCAT ETT secure anicteric sclera ?Cardiovascular:  rrr s1s2 cap refill brisk  ?Lungs: + rhonchi bilaterally, + crackles + diminished basilar sounds. Mechanically ventilated  ?Abdomen:  ventral Hernia soft. + bowel sounds  ?Musculoskeletal:  no acute joint  deformity no cyanosis or clubbing ?GU: foley with CBI and small amount of hematuria collecting in foley bag  ?Skin:  pale c/d/i ? ?Resolved Hospital Problem list   ?Lactic acidosis, Elevated trop d/t right heart strain  ?Hypotension: levo DC 3/13 ? ?Assessment & Plan:  ? ?Acute metabolic encephalopathy initially due to uremia, improving, but now confounded by CNS depressing meds needed for PAD  ?Chronic pain  ?Acute back spasm  ?P ?-RASS goal 0 to  -1 ?-delirium precautions ?-cont buprenorphine patch ?-seroquel ?-dexmed, fent  ?-adding PRN tizanidine ?-adding lido patch ?-frequent repositioning (does well laying on side like side-sleeping position)  ? ?Acute respiratory failure with hypoxemia, multifactorial ?-initially CAP + submassive PE s/p fibrinolytics, c/b HCAP + aspiration, bilateral pleural effusions R>L ?P ?-trial of intubation  ?-unasyn  ?-cont diuresis ?-may need thora for effusion depending on response to diuresis ?-AM CXR ? ?Right renal mass ?Pancreatic head/neck mass ?Bladder mass  ?-seen on CT hematuria workup 3/14  ?P ?-will need to address in [place] discussions & as critical illness stabilizes ?-appreciate palliative care following  ?-if workup/ exploration of tx options desired, will need onc consult  ? ?AKI (post-obstructive due to bladder mass, mild hydronephrosis + intrinsic due to contrast media) ?Hypernatremia, mild, improving ?Hypokalemia, mild  ?P ?-decr d5w from 75/hr to 50/hr  ?-cont FWF 466m q4hr  ?- I&O reflecting CBI ?-replace K  ? ?Acute urinary retention due to bladder mass, penile edema ?Hematuria due to bladder mass, R renal mass  ?P ?- CBI ongoing per uro ?- Finasteride ?- May need fulguration or prostatic artery embolization.  ? ?Cirrhosis ?P ?-outpt follow up ? ?Anemia of critical illness. ?- f/u CBC ?- transfuse for Hb < 7 ? ?Ventral hernia containing unobstructed small bowel and colon  ?P ?-op f/u  ? ? ? ? ?Best Practice (right click and "Reselect all SmartList Selections" daily)  ? ?Diet/type: Regular consistency (see orders) and NPO ?DVT prophylaxis: systemic heparin ?GI prophylaxis: N/A ?Lines: N/A ?Foley:  N/A ?Code Status:  limited  ?Last date of multidisciplinary goals of care discussion [3/14 - PCCM and PCM spoke with family, code status changed to partial -- no CPR-- but elected to pursue elective intubation with worsening resp status] ? ?Labs:  ? ?CMP Latest Ref Rng & Units 07/22/2021 07/22/2021 07/21/2021  ?Glucose  70 - 99 mg/dL 161(H) - 200(H)  ?BUN 8 - 23 mg/dL 11 - 13  ?Creatinine 0.61 - 1.24 mg/dL 0.88 - 0.97  ?Sodium 135 - 145 mmol/L 147(H) 148(H) 148(H)  ?Potassium 3.5 - 5.1 mmol/L 3.4(L) 3.5 3.4(L)  ?Chloride 98 - 111 mmol/L 109 - 112(H)  ?CO2 22 - 32 mmol/L 30 - 31  ?Calcium 8.9 - 10.3 mg/dL 7.2(L) - 7.2(L)  ?Total Protein 6.5 - 8.1 g/dL - - -  ?Total Bilirubin 0.3 - 1.2 mg/dL - - -  ?Alkaline Phos 38 - 126 U/L - - -  ?AST 15 - 41 U/L - - -  ?ALT 0 - 44 U/L - - -  ? ? ?CBC Latest Ref Rng & Units 07/22/2021 07/22/2021 07/21/2021  ?WBC 4.0 - 10.5 K/uL 14.5(H) - -  ?Hemoglobin 13.0 - 17.0 g/dL 7.5(L) 9.5(L) 7.8(L)  ?Hematocrit 39.0 - 52.0 % 24.7(L) 28.0(L) 23.0(L)  ?Platelets 150 - 400 K/uL 344 - -  ? ? ?ABG ?   ?Component Value Date/Time  ? PHART 7.420 07/22/2021 0343  ? PCO2ART 47.8 07/22/2021 0343  ? PO2ART 89 07/22/2021 0343  ? HCO3 30.8 (H) 07/22/2021 0343  ?  TCO2 32 07/22/2021 0343  ? ACIDBASEDEF 6.0 (H) 07/16/2021 1159  ? O2SAT 97 07/22/2021 0343  ? ? ?CBG (last 3)  ?Recent Labs  ?  07/21/21 ?2336 07/22/21 ?0326 07/22/21 ?8786  ?GLUCAP 150* 152* 152*  ? ? ?CRITICAL CARE ?Performed by: Cristal Generous ? ? ?Total critical care time: 45 minutes ? ?Critical care time was exclusive of separately billable procedures and treating other patients. ?Critical care was necessary to treat or prevent imminent or life-threatening deterioration. ? ?Critical care was time spent personally by me on the following activities: development of treatment plan with patient and/or surrogate as well as nursing, discussions with consultants, evaluation of patient's response to treatment, examination of patient, obtaining history from patient or surrogate, ordering and performing treatments and interventions, ordering and review of laboratory studies, ordering and review of radiographic studies, pulse oximetry and re-evaluation of patient's condition. ? ?Eliseo Gum MSN, AGACNP-BC ?[facility] ?Amion for pager   ?07/22/2021, 9:20 AM ? ? ? ? ? ? ? ? ? ? ?

## 2021-07-22 NOTE — Progress Notes (Signed)
PCCM interval progress note ? ?76yo M intubated for HCAP. Underwent thoracentesis earlier 3/15 for plerual effusion, sustained post procedural ptx and was ordered for 2 PRBC.  ? ?Follow up CXR this evening with small R apical ptx. Hgb after 1 PRBC 8 ? ?Plan ?-no acute intervention for ptx. Low threshold for repeat imaging / intervention if status changes ?-AM CXR ?-no further PRBC ?-AM CBC ? ? ?Eliseo Gum MSN, AGACNP-BC ?[facility] ?07/22/2021, 6:56 PM ? ?

## 2021-07-23 ENCOUNTER — Inpatient Hospital Stay (HOSPITAL_COMMUNITY): Payer: No Typology Code available for payment source

## 2021-07-23 DIAGNOSIS — Z7189 Other specified counseling: Secondary | ICD-10-CM | POA: Diagnosis not present

## 2021-07-23 DIAGNOSIS — Z515 Encounter for palliative care: Secondary | ICD-10-CM | POA: Diagnosis not present

## 2021-07-23 DIAGNOSIS — N179 Acute kidney failure, unspecified: Secondary | ICD-10-CM | POA: Diagnosis not present

## 2021-07-23 DIAGNOSIS — J9601 Acute respiratory failure with hypoxia: Secondary | ICD-10-CM | POA: Diagnosis not present

## 2021-07-23 DIAGNOSIS — J810 Acute pulmonary edema: Secondary | ICD-10-CM | POA: Diagnosis not present

## 2021-07-23 DIAGNOSIS — I2602 Saddle embolus of pulmonary artery with acute cor pulmonale: Secondary | ICD-10-CM | POA: Diagnosis not present

## 2021-07-23 LAB — CBC
HCT: 24.2 % — ABNORMAL LOW (ref 39.0–52.0)
Hemoglobin: 7.5 g/dL — ABNORMAL LOW (ref 13.0–17.0)
MCH: 28.5 pg (ref 26.0–34.0)
MCHC: 31 g/dL (ref 30.0–36.0)
MCV: 92 fL (ref 80.0–100.0)
Platelets: 281 10*3/uL (ref 150–400)
RBC: 2.63 MIL/uL — ABNORMAL LOW (ref 4.22–5.81)
RDW: 16.8 % — ABNORMAL HIGH (ref 11.5–15.5)
WBC: 12.2 10*3/uL — ABNORMAL HIGH (ref 4.0–10.5)
nRBC: 0.2 % (ref 0.0–0.2)

## 2021-07-23 LAB — COMPREHENSIVE METABOLIC PANEL
ALT: 8 U/L (ref 0–44)
AST: 14 U/L — ABNORMAL LOW (ref 15–41)
Albumin: 1.7 g/dL — ABNORMAL LOW (ref 3.5–5.0)
Alkaline Phosphatase: 79 U/L (ref 38–126)
Anion gap: 10 (ref 5–15)
BUN: 10 mg/dL (ref 8–23)
CO2: 30 mmol/L (ref 22–32)
Calcium: 7.1 mg/dL — ABNORMAL LOW (ref 8.9–10.3)
Chloride: 105 mmol/L (ref 98–111)
Creatinine, Ser: 0.8 mg/dL (ref 0.61–1.24)
GFR, Estimated: >60 mL/min (ref >60)
Glucose, Bld: 150 mg/dL — ABNORMAL HIGH (ref 70–99)
Potassium: 3.5 mmol/L (ref 3.5–5.1)
Sodium: 145 mmol/L (ref 135–145)
Total Bilirubin: 0.3 mg/dL (ref 0.3–1.2)
Total Protein: 5 g/dL — ABNORMAL LOW (ref 6.5–8.1)

## 2021-07-23 LAB — HEPARIN LEVEL (UNFRACTIONATED): Heparin Unfractionated: 0.45 IU/mL (ref 0.30–0.70)

## 2021-07-23 LAB — BPAM RBC
Blood Product Expiration Date: 202304052359
Blood Product Expiration Date: 202304052359
Blood Product Expiration Date: 202304052359
ISSUE DATE / TIME: 202303120723
ISSUE DATE / TIME: 202303151227
ISSUE DATE / TIME: 202303151227
Unit Type and Rh: 5100
Unit Type and Rh: 5100
Unit Type and Rh: 5100

## 2021-07-23 LAB — TYPE AND SCREEN
ABO/RH(D): O POS
Antibody Screen: NEGATIVE
Unit division: 0
Unit division: 0
Unit division: 0

## 2021-07-23 LAB — GLUCOSE, CAPILLARY
Glucose-Capillary: 129 mg/dL — ABNORMAL HIGH (ref 70–99)
Glucose-Capillary: 168 mg/dL — ABNORMAL HIGH (ref 70–99)
Glucose-Capillary: 173 mg/dL — ABNORMAL HIGH (ref 70–99)
Glucose-Capillary: 160 mg/dL — ABNORMAL HIGH (ref 70–99)
Glucose-Capillary: 154 mg/dL — ABNORMAL HIGH (ref 70–99)
Glucose-Capillary: 167 mg/dL — ABNORMAL HIGH (ref 70–99)

## 2021-07-23 LAB — PHOSPHORUS: Phosphorus: 3.2 mg/dL (ref 2.5–4.6)

## 2021-07-23 MED ORDER — FUROSEMIDE 10 MG/ML IJ SOLN
40.0000 mg | Freq: Two times a day (BID) | INTRAMUSCULAR | Status: AC
  Administered 2021-07-23 (×2): 40 mg via INTRAVENOUS
  Filled 2021-07-23 (×2): qty 4

## 2021-07-23 MED ORDER — POTASSIUM CHLORIDE 10 MEQ/100ML IV SOLN
10.0000 meq | INTRAVENOUS | Status: AC
  Administered 2021-07-23 (×3): 10 meq via INTRAVENOUS
  Filled 2021-07-23 (×3): qty 100

## 2021-07-23 MED ORDER — FREE WATER
200.0000 mL | Status: DC
  Administered 2021-07-23 – 2021-07-24 (×7): 200 mL

## 2021-07-23 NOTE — Plan of Care (Signed)
?  Problem: Clinical Measurements: ?Goal: Respiratory complications will improve ?Outcome: Progressing ?Note: Pt is currently weaning and tolerating it well. ?Goal: Cardiovascular complication will be avoided ?Outcome: Progressing ?  ?Problem: Nutrition: ?Goal: Adequate nutrition will be maintained ?Outcome: Progressing ?Note: Pt is tolerating tube feeds well at goal. ?  ?Problem: Activity: ?Goal: Risk for activity intolerance will decrease ?Outcome: Not Progressing ?Note: Pt on ventilator and critically ill. Unable to mobilize at this time. ?  ?

## 2021-07-23 NOTE — Progress Notes (Signed)
?Subjective: ?Intubated, sedated, nodding appropriately at me. ? ?Objective: ?Vital signs in last 24 hours: ?Temp:  [98.6 ?F (37 ?C)-101.4 ?F (38.6 ?C)] 98.6 ?F (37 ?C) (03/16 1600) ?Pulse Rate:  [56-79] 59 (03/16 1800) ?Resp:  [21-31] 24 (03/16 1800) ?BP: (102-132)/(45-98) 121/58 (03/16 1800) ?SpO2:  [88 %-99 %] 97 % (03/16 1800) ?FiO2 (%):  [40 %-50 %] 40 % (03/16 1600) ?Weight:  [90.1 kg] 90.1 kg (03/16 0500) ? ?Intake/Output from previous day: ?03/15 0701 - 03/16 0700 ?In: 7597.3 [I.V.:2912.1; NG/GT:3775; IV Piggyback:910.3] ?Out: 50093 [GHWEX:93716] ?Intake/Output this shift: ?Total I/O ?In: 3520.4 [I.V.:806.1; Other:1500; NG/GT:772; IV Piggyback:442.4] ?Out: 3940 [RCVEL:3810] ? ?Physical Exam:  ?Abdomen: Soft, ND, NT ?GU: Foley clear, no hematuria ? ?Lab Results: ?Recent Labs  ?  07/22/21 ?1240 07/22/21 ?1739 07/23/21 ?0615  ?HGB 7.5* 8.0* 7.5*  ?HCT 24.9* 26.2* 24.2*  ? ?BMET ?Recent Labs  ?  07/22/21 ?0442 07/23/21 ?0615  ?NA 147* 145  ?K 3.4* 3.5  ?CL 109 105  ?CO2 30 30  ?GLUCOSE 161* 150*  ?BUN 11 10  ?CREATININE 0.88 0.80  ?CALCIUM 7.2* 7.1*  ? ? ? ?Studies/Results: ?CT CHEST WO CONTRAST ? ?Result Date: 07/21/2021 ?CLINICAL DATA:  Pneumonia. Complication suspected. Respiratory distress. On ventilator. EXAM: CT CHEST WITHOUT CONTRAST TECHNIQUE: Multidetector CT imaging of the chest was performed following the standard protocol without IV contrast. RADIATION DOSE REDUCTION: This exam was performed according to the departmental dose-optimization program which includes automated exposure control, adjustment of the mA and/or kV according to patient size and/or use of iterative reconstruction technique. COMPARISON:  AP chest 07/21/2021, 07/20/2021, 07/18/2021, 07/08/2021; CTA chest 07/10/2021 FINDINGS: An endotracheal tube terminates approximately 3.3 cm above the carina. An enteric tube courses into the stomach with the distal tip excluded by collimation. Left subclavian approach central venous catheter tip  overlies the central superior vena cava. Cardiovascular: Heart size is moderately enlarged, unchanged. Dense coronary artery and aortic root calcifications are seen. No thoracic aortic aneurysm. Mild calcifications within the thoracic aorta. Note is made that extensive bilateral pulmonary emboli were seen on contrasted 07/10/2021 CT. Lack of IV contrast precludes evaluation of these prior emboli. The main pulmonary artery measures up to 3.3 cm in caliber again enlarged as can be seen with chronic pulmonary arterial hypertension. Mediastinum/Nodes: No axillary mediastinal or hilar pathologically enlarged lymph nodes by CT criteria. Subcentimeter short axis right paratracheal lymph nodes are noted. The visualized thyroid is grossly unremarkable. The esophagus follows a normal course. Lungs/Pleura: The central airways are patent. Interval increase in now moderate right and small left pleural effusions. Interval worsening in now moderate to high-grade patchy bilateral ground-glass opacities and mild bilateral consolidative opacities. No pneumothorax. Upper Abdomen: No acute abnormality. Musculoskeletal: Mild-to-moderate multilevel degenerative disc changes of the thoracic spine. IMPRESSION:: IMPRESSION: 1. Endotracheal tube in appropriate position. 2. Moderate cardiomegaly. 3. Interval increase in bilateral ground-glass lung opacities and mild consolidative opacities suspicious for multifocal infection. 4. Interval increase in now moderate right and small left pleural effusions. 5. Note is made of bilateral pulmonary emboli on the prior contrasted CT 07/10/2021. Lack of IV contrast on the current study precludes evaluation of the prior emboli. Aortic Atherosclerosis (ICD10-I70.0). Electronically Signed   By: Yvonne Kendall M.D.   On: 07/21/2021 20:30  ? ?DG CHEST PORT 1 VIEW ? ?Result Date: 07/23/2021 ?CLINICAL DATA:  76 year old male with respiratory failure. Complicated pneumonia. Recent right pneumothorax with small  associated right pneumothorax. EXAM: PORTABLE CHEST 1 VIEW COMPARISON:  CT Chest, Abdomen, and Pelvis  07/21/2021. Portable chest 07/22/2021 and earlier. FINDINGS: Portable AP semi upright view at 0617 hours. Stable lines and tubes. Stable lung volumes and mediastinal contours with Patchy and confluent bilateral pulmonary opacity seen to be a combination of pleural fluid and multifocal confluent peribronchial opacity with occasional peribronchial consolidation on the CT 2 days ago. Stable ventilation since that time. Superimposed apical right pneumothorax to the level of the 3rd posterior and lateral rib appears stable to slightly smaller since yesterday. Negative visible bowel gas. No acute osseous abnormality identified. IMPRESSION: 1. Stable lines and tubes. 2. Stable to slightly smaller right apical pneumothorax since yesterday. 3. Otherwise stable ventilation since 07/21/2021 with combined multifocal bilateral bronchopneumonia and pleural effusions. Electronically Signed   By: Genevie Ann M.D.   On: 07/23/2021 08:26  ? ?DG CHEST PORT 1 VIEW ? ?Result Date: 07/22/2021 ?CLINICAL DATA:  Follow-up pneumothorax EXAM: PORTABLE CHEST 1 VIEW COMPARISON:  Chest radiograph performed earlier on the same date at 12:27 p.m. FINDINGS: Endotracheal tube with distal tip in the midtrachea. Feeding tube coursing below the diaphragm with distal tip not included. Left approach central venous catheter with distal tip overlies the SVC. The heart is enlarged. Diffuse interstitial and bilateral airspace opacities, unchanged. Small right apical pneumothorax is unchanged. No acute osseous abnormality. IMPRESSION: 1.  Stable right apical pneumothorax. 2.  Lines and tubes as above and unchanged. 3.  Bilateral interstitial and airspace opacities, unchanged. Electronically Signed   By: Keane Police D.O.   On: 07/22/2021 17:41  ? ?DG CHEST PORT 1 VIEW ? ?Result Date: 07/22/2021 ?CLINICAL DATA:  Status post right thoracentesis EXAM: PORTABLE CHEST 1  VIEW COMPARISON:  Radiograph 07/22/2021 FINDINGS: Endotracheal tube tip overlies the midthoracic trachea. Left approach central venous catheter tip overlies the distal superior vena cava. Feeding tube tip passes below the diaphragm, tip excluded by collimation. Diffuse interstitial and bilateral airspace disease, ostium unchanged. Decreased right pleural effusion. There is a right-sided pneumothorax measuring up to 1.5 cm. Thoracic spondylosis. No acute osseous abnormality. IMPRESSION: Decreased right pleural effusion after thoracentesis. New small right-sided pneumothorax. Stable left pleural effusion. No significant change in diffuse interstitial and airspace opacities compatible with pneumonia and/or edema. These results were called by telephone at the time of interpretation on 07/22/2021 at 12:54 pm to provider [facility] , who verbally acknowledged these results. Electronically Signed   By: Maurine Simmering M.D.   On: 07/22/2021 12:56  ? ?DG Chest Port 1 View ? ?Result Date: 07/22/2021 ?CLINICAL DATA:  Pneumonia/ETT EXAM: PORTABLE CHEST 1 VIEW COMPARISON:  07/21/2021 FINDINGS: Endotracheal tube overlies the midthoracic trachea. Left extremity PICC tip overlies the superior cavoatrial junction. Feeding tube tip overlies the stomach. Unchanged cardiomediastinal silhouette. Persistent diffuse interstitial and bilateral airspace disease, not significant changed from prior. Small bilateral pleural effusions. No visible pneumothorax. No acute osseous abnormality. IMPRESSION: Endotracheal tube tip overlies the midthoracic trachea. No significant change in interstitial and multifocal airspace disease bilaterally consistent with pneumonia and/or edema. Electronically Signed   By: Maurine Simmering M.D.   On: 07/22/2021 08:26  ? ?CT HEMATURIA WORKUP ? ?Result Date: 07/22/2021 ?CLINICAL DATA:  Acute urinary retention, Foley catheter in place. Traumatic hematuria EXAM: CT ABDOMEN AND PELVIS WITHOUT AND WITH CONTRAST TECHNIQUE:  Multidetector CT imaging of the abdomen and pelvis was performed following the standard protocol before and following the bolus administration of intravenous contrast. RADIATION DOSE REDUCTION: This exam was

## 2021-07-23 NOTE — Progress Notes (Signed)
ANTICOAGULATION CONSULT NOTE ? ?Pharmacy Consult for heparin ?Indication: pulmonary embolus ? ?Allergies  ?Allergen Reactions  ? Influenza Virus Vacc Split Pf Hives  ? Dye Fdc Red [Red Dye]   ? ? ?Patient Measurements: ?Height: '5\' 5"'$  (165.1 cm) ?Weight: 90.1 kg (198 lb 10.2 oz) ?IBW/kg (Calculated) : 61.5 ?HEPARIN DW (KG): 82.9  ? ?Labs: ?Recent Labs  ?  07/21/21 ?7628 07/21/21 ?3151 07/21/21 ?1534 07/21/21 ?1548 07/21/21 ?1727 07/21/21 ?1748 07/22/21 ?0343 07/22/21 ?0442 07/22/21 ?1240 07/22/21 ?1739 07/23/21 ?0615  ?HGB 7.5*  --    < > 7.7*   < >  --    < > 7.5* 7.5* 8.0* 7.5*  ?HCT 24.6*  --    < > 25.1*   < >  --    < > 24.7* 24.9* 26.2* 24.2*  ?PLT 320  --   --  326  --   --   --  344  --   --  281  ?HEPARINUNFRC 0.45 0.52  --   --   --   --   --  0.54  --   --  0.45  ?CREATININE 0.96  --   --   --   --  0.97  --  0.88  --   --   --   ? < > = values in this interval not displayed.  ? ? ? ?Estimated Creatinine Clearance: 72.5 mL/min (by C-G formula based on SCr of 0.88 mg/dL). ? ? ?Assessment: ?3 YOM admitted for SOB, found to have acute PE with evidence of RHS (RV/LV ratio = 1.3) consistent with at least submassive PE. Patient transferred from [facility] to [facility] for possible IR intervention vs. thrombolytic. Patient is s/p systemic tPA on 3/5 and heparin was resumed.  ? ?New hematuria with clots noted on 3/11, CBI initiated. Hemoglobin trended down, s/p transfusion 3/12 - stable today, plt wnl. ? ?Heparin level therapeutic for lower goal 0.3-0.5 on heparin at 1500 units/hr. Hematuria resolved and CBI clamped 3/15, plan to resume if recurs per Urology. ? ?Goal of Therapy:  ?Heparin level 0.3-0.5 units/mL with new hematuria ?Monitor platelets by anticoagulation protocol: Yes ?  ?Plan:  ?Continue heparin at 1500 units/hr ?Recheck heparin level with AM labs, daily CBC ?Watch Hgb closely in setting of hematuria (improving) ? ? ?Arturo Morton, PharmD, BCPS ?Please check AMION for all [facility] contact numbers ?Clinical  Pharmacist ?07/23/2021 7:23 AM ?

## 2021-07-23 NOTE — Progress Notes (Signed)
?                                                   ?Daily Progress Note  ? ?Patient Name: Jason Holt.       Date: 07/23/2021 ?DOB: 03-Dec-1945  Age: 76 y.o. MRN#: [id number] ?Attending Physician: Jason Kindle, MD ?Primary Care Physician: [facility], [facility] ?Admit Date: 07/05/2021 ? ?Reason for Consultation/Follow-up: Establishing goals of care ? ?Subjective: ?Remains on ventilator, family member at bedside ?Some pain following thoracentesis yesterday ? ?Length of Stay: 16 ? ?Current Medications: ?Scheduled Meds:  ?? sodium chloride   Intravenous Once  ?? Chlorhexidine Gluconate Cloth  6 each Topical Q0600  ?? docusate  100 mg Per Tube BID  ?? feeding supplement (PROSource TF)  45 mL Per Tube BID  ?? fentaNYL (SUBLIMAZE) injection  25 mcg Intravenous Once  ?? finasteride  5 mg Oral Daily  ?? free water  200 mL Per Tube Q4H  ?? furosemide  40 mg Intravenous Q12H  ?? lidocaine  2 patch Transdermal Q24H  ?? lidocaine  1 application. Urethral Once  ?? mouth rinse  15 mL Mouth Rinse 10 times per day  ?? pantoprazole sodium  40 mg Per Tube Daily  ?? polyethylene glycol  17 g Per Tube Daily  ?? QUEtiapine  50 mg Per Tube QHS  ? ? ?Continuous Infusions: ?? sodium chloride 10 mL/hr at 07/19/21 1121  ?? ampicillin-sulbactam (UNASYN) IV Stopped (07/23/21 1335)  ?? dexmedetomidine (PRECEDEX) IV infusion 1.2 mcg/kg/hr (07/23/21 1400)  ?? feeding supplement (VITAL 1.5 CAL) 1,000 mL (07/23/21 1152)  ?? fentaNYL infusion INTRAVENOUS 150 mcg/hr (07/23/21 1410)  ?? heparin 1,500 Units/hr (07/23/21 1400)  ?? sodium chloride irrigation    ?? sodium chloride irrigation    ? ? ?PRN Meds: ?sodium chloride, acetaminophen **OR** acetaminophen, fentaNYL, Gerhardt's butt cream, hydrALAZINE, ipratropium-albuterol, loperamide HCl, [DISCONTINUED] ondansetron **OR** ondansetron (ZOFRAN) IV,  tiZANidine ? ?Physical Exam ?Constitutional:   ?   General: He is not in acute distress. ?   Appearance: He is ill-appearing.  ?Pulmonary:  ?   Comments: Remains on ventilator ?         ? ?Vital Signs: BP (!) 117/51   Pulse (!) 56   Temp 99.7 ?F (37.6 ?C) (Axillary)   Resp (!) 24   Ht '5\' 5"'$  (1.651 m)   Wt 90.1 kg   SpO2 96%   BMI 33.05 kg/m?  ?SpO2: SpO2: 96 % ?O2 Device: O2 Device: Ventilator ?O2 Flow Rate: O2 Flow Rate (L/min): 25 L/min ? ?Intake/output summary:  ?Intake/Output Summary (Last 24 hours) at 07/23/2021 1431 ?Last data filed at 07/23/2021 1400 ?Gross per 24 hour  ?Intake 8397.91 ml  ?Output 6740 ml  ?Net 1657.91 ml  ? ?LBM: Last BM Date : 07/21/21 ?Baseline Weight: Weight: 100.7 kg ?Most recent weight: Weight: 90.1 kg ? ?Patient Active Problem List  ? Diagnosis Date Noted  ?? Community acquired pneumonia   ?? Acute pulmonary edema (HCC)   ?? Anemia 07/17/2021  ?? Hypotension due to drugs 07/17/2021  ?? Acute kidney failure ([place])   ?? Goals of care, counseling/discussion   ?? Palliative care by specialist   ?? Acute metabolic encephalopathy 74/82/7078  ?? 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 Atelectasis 07/15/2021  ?? Normal anion gap metabolic acidosis 67/54/4920  ?? Tremor 07/15/2021  ?? Chronic  pain 07/15/2021  ?? Opioid dependence ([place]) 07/15/2021  ?? Acute pulmonary embolism ([place]) 07/10/2021  ?? Leukocytosis 07/09/2021  ?? Acute hypoxemic respiratory failure ([place]) 06/16/2021  ?? AKI (acute kidney injury) ([place]) 07/05/2021  ?? Pressure injury of skin 07/06/2021  ?? AMS (altered mental status) 06/08/2019  ?? Depression 06/08/2019  ?? Essential hypertension 06/08/2019  ?? GERD (gastroesophageal reflux disease) 08/15/2013  ? ? ?Palliative Care Assessment & Plan  ? ?HPI: ?76 y.o. male  with past medical history of chronic low back pain, hypertension, GERD, and chronic knee pain admitted on 07/01/2021 for low oxygen.  He was initially diagnosed with pneumonia and sepsis and also required BiPAP for  several days.  He was transferred to [facility] March 4 and found to have a saddle PE.  On March 7 he was found to have rising creatinine and kidney function has continued to worsen.  On March 9 his mental status became more altered.  PMT consulted to discuss goals of care. ? ?Assessment: ?Received update from bedside RN as well as critical care NP. ?Patient did not tolerate spontaneous breathing trial this morning. ?Family has been made aware of concern of malignancy. ?Spoke with patient's 2 daughters-briefly reviewed their conversation with critical care NP.  Family is requesting further conversations to discuss goals of care.  We reviewed the need to set expectations and a plan for his care moving forward. ? ?Family meeting scheduled for tomorrow March 17 at 3:30 PM. ? ?Recommendations/Plan: ?Family meeting tomorrow afternoon ?Maintain partial code-no CPR ? ?Care plan was discussed with patient's 2 daughters, bedside RN, Jason Limerick NP ? ?Thank you for allowing the Palliative Medicine Team to assist in the care of this patient. ? ?*Please note that this is a verbal dictation therefore any spelling or grammatical errors are due to the "[place] [employer]" system interpretation. ? ?Jason Burrow, DNP, AGNP-C ?Palliative Medicine Team ?Team Phone # [phone]  ?Pager [phone] ? ?

## 2021-07-23 NOTE — Progress Notes (Signed)
? ?NAME:  Jason Remer., MRN:  [id number], DOB:  12-03-1945, LOS: 69 ?ADMISSION DATE:  07/04/2021, CONSULTATION DATE:  2/28 ?REFERRING MD:  Wynetta Emery, CHIEF COMPLAINT:  Dyspnea  ? ?History of Present Illness:  ?76 yo male presented to [facility] with dyspnea for 3 days.  EMS called on 2/27 and he had SpO2 in the 80's on room air.  He declined option to come to ER then.  Symptoms got worse and he came to ER.  SpO2 in 70's on room air.  Found to have Rt sided pneumonia.  Started on IV fluids and antibiotics for sepsis, and Bipap with oxygen for respiratory failure.  Had persistent hypoxia and had CT angiogram showed acute saddle PE.  Transferred to [facility] for further management. ? ?Pertinent  Medical History  ?HTN, Depression, GERD ? ?Significant Hospital Events: ?Including procedures, antibiotic start and stop dates in addition to other pertinent events   ?2/28 admission, treatment for pneumonia with ceftriaxone/azithro, elevated troponin, treated with BIPAP ?3/1 TTE> LVEF 55-60%, mild LVH, D shaped septum, evidence of RV overload ?3/3 found to have saddle PE on CT angio, patchy ggo bilaterally; negative lower ext doppler, started on heparin ?3/4 moved to [facility] for persistent hypoxemia, remains on HHF ?3/5 no change in oxygen needs, given full dose TPA ?3/6 weaned O2 needs, tolerating diet. ?3/7 nephro consulted d/t rising cr. They felt mixed picture of Hypotension and Contrast dye injury.  Received Lasix IV. ?3/8 still on 50% heated high flow. Renal fxn worse.  Added cefepime.  For possible hcap, MRSA PCR again negative.  Bicarbonate supplementation started ?3/9: Renal function continues to worsen.  Urine output dropping.  Increasing bicarbonate, adding back gentle IV hydration.  No improvement in oxygen requirements ?3/10 on Precedex and BIPAP. BP boarderline hypotension, bradycardia all 2/2 dex. Increased edema on CXR. Place CVL for co-ox monitoring and CVP, creatinine hitting plateau and UOP picking up  ?3/13 Less  confused per nursing, continued  high oxygen demand ?3/14 [place] mtg with family -- changed from DNR to partial and wanted elective intubation with worsening resp failure ?3/15 CT hematuria -- R renal mass, pancreatic head/neck mass, bladder mass. Cirrhosis.  Had a thora for R pleural effusion, complicated by bleeding and post-procedural ptx. 1 PRBC  ? ?Interim History / Subjective:  ? ?Got 1 PRBC after thora  ?CXR last night with stable appearing ptx ? ?CXR this morning looks like reduction in sz of ptx. Worse bilateral infiltrates  ? ?Overnight has new fever.  ? ?Was kept on 254mg fent and 1 dexmed overnight with RASS -2.  ? ?Objective   ?Blood pressure (!) 132/59, pulse (!) 58, temperature 100.2 ?F (37.9 ?C), temperature source Axillary, resp. rate (!) 24, height '5\' 5"'$  (1.651 m), weight 90.1 kg, SpO2 96 %. ?   ?Vent Mode: PRVC ?FiO2 (%):  [40 %-50 %] 40 % ?Set Rate:  [24 bmp] 24 bmp ?Vt Set:  [490 mL] 490 mL ?PEEP:  [5 cmH20-8 cmH20] 5 cmH20 ?Plateau Pressure:  [19 cmH20-26 cmH20] 26 cmH20  ? ?Intake/Output Summary (Last 24 hours) at 07/23/2021 1030 ?Last data filed at 07/23/2021 0900 ?Gross per 24 hour  ?Intake 7442.46 ml  ?Output 10400 ml  ?Net -2957.54 ml  ? ?Filed Weights  ? 07/20/21 0800 07/22/21 0440 07/23/21 0500  ?Weight: 96.8 kg 90.1 kg 90.1 kg  ? ? ?Examination: ?General: Elderly critically ill M intubated lightly sedated NAD  ?Neuro:  Awake alert PERRL No focal deficits  ?HEENT: NCAT pink mm  ETT secure with blood tinged secretions. Glasses  ?Cardiovascular:  rrr s1s2 no rgm  ?Lungs: Crackles bilaterally. Even and unlabored on PSV ?Abdomen:  soft ndnt  ?Musculoskeletal:  no acute joint deformity no cyanosis or clubbing ?GU: foley, no hematuria  ?Skin:  c/d/w. Scattered ecchymosis  ? ?Resolved Hospital Problem list   ?Lactic acidosis, Elevated trop d/t right heart strain  ?Hypotension: levo DC 3/13 ? ?Assessment & Plan:  ? ?Acute metabolic encephalopathy ?Chronic pain ?Acute back spasm  ?-encephalopathy  initially due to AKI with uremia which improved. Now confounded by CNS depressing meds, infection  ?P ?-delirium precautions ?-goal RASS 0 to -1 ?-cont buprenorphine patch ?-seroquel ?-dexmed, fent  ?-PRN tizanidine, lido patch ?-frequent repositioning (does well laying on side like side-sleeping position)  ? ?Acute respiratory failure with hypoxemia: ?Iatrogenic post-procedural ptx on right,  ?CAP, submassive PE, HCAP / aspiration PNA, Pleural effusions ?P ?-trial of intubation -- discussed with pt and family 3/16 the ideal of optimizing for 1 -way extubation and consideration of a time trial  ?-WUA/SBT qD ?-unasyn  ?-cont diuresis -- increasing to lasix '40mg'$  BID ?-heparin gtt  ? ?Right renal mass ?Pancreatic head/neck mass ?Bladder mass  ?-bladder mass known since 2018, renal mass seen on RUQ [place] 3/20 pancreatic mass seen on CT hematuria workup 3/14  ?P ?-will need to address in [place] discussions as critical illness stabilizes  ?-Not felt to be a surgical candidate per uro  ?-defer onc consult as it seems most prudent for focus on extubation and palliative consult for [place] first  ? ?AKI(post-obstructive due to bladder mass, mild hydronephrosis + intrinsic due to contrast media) ?Hypernatremia, improved ?Hypokalemia, mild  ?P ?-reduce FWF from 465m q4 to 2029mq4 ?-dc dextrose gtt  ?- I&O reflecting CBI ?-replace lytes as needed (will give K 3/16 with plan for BID diuresis)  ?  ?Acute urinary retention  ?Hematuria, improving ?P ?- CBI clamped per uro  ?- Finasteride ? ?Cirrhosis ?P ?-outpt follow up ? ?Anemia -- multifactorial ?-ABLA ?-Anemia of chronic illness ?-Anemia of critical disease  ?P ?- f/u CBC ?- transfuse for Hb < 7 ? ?Sedation related hypotension, improving ?-NE as needed for MAP >65  ? ?Ventral hernia containing unobstructed small bowel and colon  ?P ?-op f/u  ? ?Goals of Care ?-difficult situation for this patient with chronically and acutely/critically ill patient  ?-I shared the findings of the  patient's CT hematuria study with him (bladder mass, renal mass, pancreatic mass --though bladder mass was found in 2018 and renal mass seen on RUQ [place]) and my concern for malignancy.  ?-I recommended aggressively working toward weaning off vent in coming day(s), with 1-way extubation and consideration of time trial. Discussed with pt (who is intubated but awake, nodding/writing to communicate) as well as family.  I did not firmly establish a time-trial date but all parties feel this is an appropriate consideration ?-Palliative care plans to set up mtg to discuss further ? ? ?Best Practice (right click and "Reselect all SmartList Selections" daily)  ? ?Diet/type: Regular consistency (see orders) and NPO ?DVT prophylaxis: systemic heparin ?GI prophylaxis: N/A ?Lines: N/A ?Foley:  N/A ?Code Status:  limited  ?Last date of multidisciplinary goals of care discussion [3/16 discussed with daughters via phone]  ? ?Labs:  ? ?CMP Latest Ref Rng & Units 07/23/2021 07/22/2021 07/22/2021  ?Glucose 70 - 99 mg/dL 150(H) 161(H) -  ?BUN 8 - 23 mg/dL 10 11 -  ?Creatinine 0.61 - 1.24 mg/dL 0.80  0.88 -  ?Sodium 135 - 145 mmol/L 145 147(H) 148(H)  ?Potassium 3.5 - 5.1 mmol/L 3.5 3.4(L) 3.5  ?Chloride 98 - 111 mmol/L 105 109 -  ?CO2 22 - 32 mmol/L 30 30 -  ?Calcium 8.9 - 10.3 mg/dL 7.1(L) 7.2(L) -  ?Total Protein 6.5 - 8.1 g/dL 5.0(L) - -  ?Total Bilirubin 0.3 - 1.2 mg/dL 0.3 - -  ?Alkaline Phos 38 - 126 U/L 79 - -  ?AST 15 - 41 U/L 14(L) - -  ?ALT 0 - 44 U/L 8 - -  ? ? ?CBC Latest Ref Rng & Units 07/23/2021 07/22/2021 07/22/2021  ?WBC 4.0 - 10.5 K/uL 12.2(H) - -  ?Hemoglobin 13.0 - 17.0 g/dL 7.5(L) 8.0(L) 7.5(L)  ?Hematocrit 39.0 - 52.0 % 24.2(L) 26.2(L) 24.9(L)  ?Platelets 150 - 400 K/uL 281 - -  ? ? ?ABG ?   ?Component Value Date/Time  ? PHART 7.420 07/22/2021 0343  ? PCO2ART 47.8 07/22/2021 0343  ? PO2ART 89 07/22/2021 0343  ? HCO3 30.8 (H) 07/22/2021 0343  ? TCO2 32 07/22/2021 0343  ? ACIDBASEDEF 6.0 (H) 07/16/2021 1159  ?  O2SAT 97 07/22/2021 0343  ? ? ?CBG (last 3)  ?Recent Labs  ?  07/22/21 ?2331 07/23/21 ?8381 07/23/21 ?8403  ?GLUCAP 138* 129* 168*  ? ? ?CRITICAL CARE ?Performed by: Cristal Generous ? ? ?Total critical care time: 52 min

## 2021-07-23 NOTE — Progress Notes (Signed)
OT Cancellation Note ? ?Patient Details ?Name: Jason Holt. ?MRN: [id number] ?DOB: 05-13-1945 ? ? ?Cancelled Treatment:    Reason Eval/Treat Not Completed: Other (comment);Medical issues which prohibited therapy (Reason Eval/Treat Not Completed: Medical issues which prohibited therapy; RN reports pt still with pain from procedure yesterday still needing lot of medication and mentally fatigued from news of recent scans.  Will check back another day.) ? ?Jason Holt,Jason Holt ?07/23/2021, 2:39 PM ?Jason Holt, OT/L  ? ?Acute OT Clinical Specialist ?Acute Rehabilitation Services ?Pager [phone] ?Office [phone]  ?

## 2021-07-23 NOTE — Progress Notes (Signed)
PT Cancellation Note ? ?Patient Details ?Name: Jason Holt. ?MRN: [id number] ?DOB: 16-Jan-1946 ? ? ?Cancelled Treatment:    Reason Eval/Treat Not Completed: Medical issues which prohibited therapy; RN reports pt still with pain from procedure yesterday still needing lot of medication and mentally fatigued from news of recent scans.  Will check back another day. ? ? ?Reginia Naas ?07/23/2021, 10:49 AM ?Magda Kiel, PT ?Acute Rehabilitation Services ?DHDIX:[phone] ?[phone] ?07/23/2021 ? ?

## 2021-07-24 ENCOUNTER — Inpatient Hospital Stay (HOSPITAL_COMMUNITY): Payer: No Typology Code available for payment source

## 2021-07-24 DIAGNOSIS — J9601 Acute respiratory failure with hypoxia: Secondary | ICD-10-CM | POA: Diagnosis not present

## 2021-07-24 DIAGNOSIS — Z515 Encounter for palliative care: Secondary | ICD-10-CM | POA: Diagnosis not present

## 2021-07-24 DIAGNOSIS — I2602 Saddle embolus of pulmonary artery with acute cor pulmonale: Secondary | ICD-10-CM | POA: Diagnosis not present

## 2021-07-24 DIAGNOSIS — Z7189 Other specified counseling: Secondary | ICD-10-CM | POA: Diagnosis not present

## 2021-07-24 DIAGNOSIS — N179 Acute kidney failure, unspecified: Secondary | ICD-10-CM | POA: Diagnosis not present

## 2021-07-24 LAB — COMPREHENSIVE METABOLIC PANEL
ALT: 9 U/L (ref 0–44)
AST: 16 U/L (ref 15–41)
Albumin: 1.8 g/dL — ABNORMAL LOW (ref 3.5–5.0)
Alkaline Phosphatase: 75 U/L (ref 38–126)
Anion gap: 8 (ref 5–15)
BUN: 10 mg/dL (ref 8–23)
CO2: 30 mmol/L (ref 22–32)
Calcium: 7.5 mg/dL — ABNORMAL LOW (ref 8.9–10.3)
Chloride: 107 mmol/L (ref 98–111)
Creatinine, Ser: 0.75 mg/dL (ref 0.61–1.24)
GFR, Estimated: >60 mL/min (ref >60)
Glucose, Bld: 165 mg/dL — ABNORMAL HIGH (ref 70–99)
Potassium: 3.6 mmol/L (ref 3.5–5.1)
Sodium: 145 mmol/L (ref 135–145)
Total Bilirubin: 0.3 mg/dL (ref 0.3–1.2)
Total Protein: 5.2 g/dL — ABNORMAL LOW (ref 6.5–8.1)

## 2021-07-24 LAB — GLUCOSE, CAPILLARY
Glucose-Capillary: 161 mg/dL — ABNORMAL HIGH (ref 70–99)
Glucose-Capillary: 153 mg/dL — ABNORMAL HIGH (ref 70–99)
Glucose-Capillary: 156 mg/dL — ABNORMAL HIGH (ref 70–99)

## 2021-07-24 LAB — CULTURE, RESPIRATORY W GRAM STAIN: Culture: NORMAL

## 2021-07-24 LAB — CBC
HCT: 25.8 % — ABNORMAL LOW (ref 39.0–52.0)
Hemoglobin: 8.1 g/dL — ABNORMAL LOW (ref 13.0–17.0)
MCH: 28.8 pg (ref 26.0–34.0)
MCHC: 31.4 g/dL (ref 30.0–36.0)
MCV: 91.8 fL (ref 80.0–100.0)
Platelets: 275 10*3/uL (ref 150–400)
RBC: 2.81 MIL/uL — ABNORMAL LOW (ref 4.22–5.81)
RDW: 16.8 % — ABNORMAL HIGH (ref 11.5–15.5)
WBC: 10.2 10*3/uL (ref 4.0–10.5)
nRBC: 0.3 % — ABNORMAL HIGH (ref 0.0–0.2)

## 2021-07-24 LAB — HEPARIN LEVEL (UNFRACTIONATED): Heparin Unfractionated: 0.45 IU/mL (ref 0.30–0.70)

## 2021-07-24 MED ORDER — FENTANYL BOLUS VIA INFUSION
100.0000 ug | INTRAVENOUS | Status: DC | PRN
  Administered 2021-07-24 (×2): 100 ug via INTRAVENOUS
  Filled 2021-07-24: qty 100

## 2021-07-24 MED ORDER — GLYCOPYRROLATE 0.2 MG/ML IJ SOLN: 0.2000 mg | INTRAMUSCULAR | Status: DC | PRN

## 2021-07-24 MED ORDER — MIDAZOLAM HCL 2 MG/2ML IJ SOLN
1.0000 mg | INTRAMUSCULAR | Status: DC | PRN
  Administered 2021-07-24: 1 mg via INTRAVENOUS
  Filled 2021-07-24: qty 2

## 2021-07-24 MED ORDER — ACETAMINOPHEN 325 MG PO TABS: 650.0000 mg | ORAL_TABLET | Freq: Four times a day (QID) | ORAL | Status: DC | PRN

## 2021-07-24 MED ORDER — ACETAMINOPHEN 650 MG RE SUPP: 650.0000 mg | Freq: Four times a day (QID) | RECTAL | Status: DC | PRN

## 2021-07-24 MED ORDER — MIDAZOLAM-SODIUM CHLORIDE 100-0.9 MG/100ML-% IV SOLN
0.0000 mg/h | INTRAVENOUS | Status: DC
  Administered 2021-07-24: 2 mg/h via INTRAVENOUS
  Filled 2021-07-24: qty 100

## 2021-07-24 MED ORDER — FENTANYL CITRATE PF 50 MCG/ML IJ SOSY: 50.0000 ug | PREFILLED_SYRINGE | INTRAMUSCULAR | Status: DC | PRN

## 2021-07-24 MED ORDER — DIPHENHYDRAMINE HCL 50 MG/ML IJ SOLN: 25.0000 mg | INTRAMUSCULAR | Status: DC | PRN

## 2021-07-24 MED ORDER — FUROSEMIDE 10 MG/ML IJ SOLN
40.0000 mg | Freq: Two times a day (BID) | INTRAMUSCULAR | Status: DC
  Administered 2021-07-24: 40 mg via INTRAVENOUS
  Filled 2021-07-24: qty 4

## 2021-07-24 MED ORDER — POLYVINYL ALCOHOL 1.4 % OP SOLN: 1.0000 [drp] | Freq: Four times a day (QID) | OPHTHALMIC | Status: DC | PRN

## 2021-07-24 MED ORDER — FENTANYL 2500MCG IN NS 250ML (10MCG/ML) PREMIX INFUSION
0.0000 ug/h | INTRAVENOUS | Status: DC
  Administered 2021-07-24: 400 ug/h via INTRAVENOUS
  Filled 2021-07-24: qty 250

## 2021-07-24 MED ORDER — TIZANIDINE HCL 4 MG PO TABS
4.0000 mg | ORAL_TABLET | Freq: Four times a day (QID) | ORAL | Status: DC | PRN
  Administered 2021-07-24: 4 mg
  Filled 2021-07-24 (×2): qty 1

## 2021-07-24 MED ORDER — GLYCOPYRROLATE 1 MG PO TABS: 1.0000 mg | ORAL_TABLET | ORAL | Status: DC | PRN

## 2021-07-24 MED ORDER — DEXTROSE 5 % IV SOLN: INTRAVENOUS | Status: DC

## 2021-07-24 MED ORDER — MIDAZOLAM HCL 2 MG/2ML IJ SOLN
2.0000 mg | INTRAMUSCULAR | Status: DC | PRN
  Administered 2021-07-24: 4 mg via INTRAVENOUS
  Filled 2021-07-24: qty 4

## 2021-07-24 MED ORDER — GLYCOPYRROLATE 0.2 MG/ML IJ SOLN
0.2000 mg | INTRAMUSCULAR | Status: DC | PRN
  Administered 2021-07-24 (×2): 0.2 mg via INTRAVENOUS
  Filled 2021-07-24 (×2): qty 1

## 2021-07-24 MED ORDER — MIDAZOLAM BOLUS VIA INFUSION (WITHDRAWAL LIFE SUSTAINING TX)
2.0000 mg | INTRAVENOUS | Status: DC | PRN
  Administered 2021-07-24 (×2): 2 mg via INTRAVENOUS
  Filled 2021-07-24: qty 2

## 2021-07-24 MED ORDER — POTASSIUM CHLORIDE 20 MEQ PO PACK
60.0000 meq | PACK | Freq: Every day | ORAL | Status: AC
  Administered 2021-07-24: 60 meq
  Filled 2021-07-24: qty 3

## 2021-07-24 MED ORDER — MORPHINE SULFATE (PF) 2 MG/ML IV SOLN
2.0000 mg | INTRAVENOUS | Status: DC | PRN
  Administered 2021-07-24: 2 mg via INTRAVENOUS
  Filled 2021-07-24: qty 1

## 2021-07-24 MED ORDER — DEXMEDETOMIDINE HCL IN NACL 400 MCG/100ML IV SOLN
0.4000 ug/kg/h | INTRAVENOUS | Status: DC
  Administered 2021-07-24 (×2): 1.2 ug/kg/h via INTRAVENOUS
  Filled 2021-07-24 (×2): qty 100

## 2021-07-24 NOTE — Progress Notes (Signed)
? ?NAME:  Jason Holt., MRN:  [id number], DOB:  05/03/1946, LOS: 43 ?ADMISSION DATE:  06/14/2021, CONSULTATION DATE:  2/28 ?REFERRING MD:  Wynetta Emery, CHIEF COMPLAINT:  Dyspnea  ? ?History of Present Illness:  ?76 yo male presented to [facility] with dyspnea for 3 days.  EMS called on 2/27 and he had SpO2 in the 80's on room air.  He declined option to come to ER then.  Symptoms got worse and he came to ER.  SpO2 in 70's on room air.  Found to have Rt sided pneumonia.  Started on IV fluids and antibiotics for sepsis, and Bipap with oxygen for respiratory failure.  Had persistent hypoxia and had CT angiogram showed acute saddle PE.  Transferred to [facility] for further management. ? ?Pertinent  Medical History  ?HTN, Depression, GERD ? ?Significant Hospital Events: ?Including procedures, antibiotic start and stop dates in addition to other pertinent events   ?2/28 admission, treatment for pneumonia with ceftriaxone/azithro, elevated troponin, treated with BIPAP ?3/1 TTE> LVEF 55-60%, mild LVH, D shaped septum, evidence of RV overload ?3/3 found to have saddle PE on CT angio, patchy ggo bilaterally; negative lower ext doppler, started on heparin ?3/4 [facility] to [facility] for [facility] hypoxemia, remains on HHF ?3/5 no change in oxygen needs, given full dose TPA ?3/6 weaned O2 needs, tolerating diet. ?3/7 nephro consulted d/t rising cr. They felt mixed picture of Hypotension and Contrast dye injury.  Received Lasix IV. ?3/8 still on 50% heated high flow. Renal fxn worse.  Added cefepime.  For possible hcap, MRSA PCR again negative.  Bicarbonate supplementation started ?3/9: Renal function continues to worsen.  Urine output dropping.  Increasing bicarbonate, adding back gentle IV hydration.  No improvement in oxygen requirements ?3/10 on Precedex and BIPAP. BP boarderline hypotension, bradycardia all 2/2 dex. Increased edema on CXR. Place CVL for co-ox monitoring and CVP, creatinine hitting plateau and UOP picking up  ?3/13 Less  confused per nursing, continued  high oxygen demand ?3/14 [place] mtg with family -- changed from DNR to partial and wanted elective intubation with worsening resp failure ?3/15 CT hematuria -- R renal mass, pancreatic head/neck mass, bladder mass. Cirrhosis.  Had a thora for R pleural effusion, complicated by bleeding and post-procedural ptx. 1 PRBC  ?3/16 diuresing. Introduced idea of time trial on vent / 1-way extubation  ?3/17 plan for family meeting with palliative care to discuss [place]  ? ?Interim History / Subjective:  ? ?CXR with persistent R apical ptx ?Bilateral opacities -- look a little worse today  ? ?Is awake this morning and phonates "thank you. I love you" around ETT  ? ?Failed SBT with tachypnea  ? ?Objective   ?Blood pressure (!) 126/58, pulse 61, temperature 99.8 ?F (37.7 ?C), temperature source Axillary, resp. rate (!) 28, height 5' 5" (1.651 m), weight 90.1 kg, SpO2 96 %. ?   ?Vent Mode: PRVC ?FiO2 (%):  [40 %] 40 % ?Set Rate:  [24 bmp] 24 bmp ?Vt Set:  [490 mL] 490 mL ?PEEP:  [5 cmH20] 5 cmH20 ?Plateau Pressure:  [22 cmH20-25 cmH20] 23 cmH20  ? ?Intake/Output Summary (Last 24 hours) at 07/24/2021 0929 ?Last data filed at 07/24/2021 0900 ?Gross per 24 hour  ?Intake 4093.01 ml  ?Output 5490 ml  ?Net -1396.99 ml  ? ?Filed Weights  ? 07/20/21 0800 07/22/21 0440 07/23/21 0500  ?Weight: 96.8 kg 90.1 kg 90.1 kg  ? ? ?Examination: ?General: Chronically and critically ill appearing elderly M intubated lightly sedated NAD  ?  Neuro:  Awake alert. Following commands. PERRL.  ?HEENT: NCAT. Glasses. ETT secure. Tan oral secretions but scant secretions from ETT  ?Cardiovascular: rrr cap refill brisk  ?Lungs: Coarse breath sounds bilaterally. Scattered crackles  ?Abdomen:  Ventral hernia, soft, ndnt  ?Musculoskeletal:  no acute joint deformity  ?GU: foley. Small amount of blood tinged output  ?Skin:  c/d/w scattered ecchymosis  ? ?Resolved Hospital Problem list   ?Lactic acidosis, Elevated trop d/t right heart strain   ?Hypotension: levo DC 3/13 ? ?Assessment & Plan:  ? ?Acute metabolic encephalopathy ?Chronic pain ?Acute back spasms  ?-encephalopathy initially due to AKI with uremia which improved. Now confounded by CNS depressing meds, infection  ?P ?-delirium precautions ?-goal RASS 0 to -1 ?-cont buprenorphine patch ?-seroquel ?-dexmed, fent  ?-PRN tizanidine, lido patch. Tizanidine has been very helpful but not lasting long enough. Will incr fq of tizanidine 3/17 to q6PRN  ?-frequent repositioning  ? ?Acute respiratory failure with hypoxemia (multifactorial): ?-CAP ?-Submassive PE s/p tpa ?-HCAP/ aspiration PNA ?-bilateral pleural effusions s/p R sided thora ?-post-procedural R apical ptx  ?-? Chemical pneumonitis confounding as well related to thora  ?P ?-plan for family meeting 3/17 with palliative care to discuss [place]. Would like to continue discussing concept of time trial on vent & 1-way extubation  ?-WUA/SBT ?-cont unasyn ?-repeat BID lasix 3/17 ?-pleural fluid from thora not processed due to volume of blood in sample  ?-heparin gtt  ? ?R renal mass  ?Bladder mass ?Pancreatic head/neck mass  ?-bladder mass known since 2018, renal mass seen on RUQ [place] 3/10, pancreatic mass seen on CT hematuria workup 3/14  ?-findings are concerning for malignancies (CT read as pancreatic mass c/f andeno, bladder mass c/f urotherial carcinoma, renal mass c/f renal cell carcinoma)  ?P ?-will need to address in [place] discussions as critical illness stabilizes  ?-defer onc consult as it seems most prudent for focus on extubation and palliative consult for [place] first  ? ?AKI (post-obstructive due to bladder mass, mild hydronephrosis + intrinsic due to contrast media) - improved  ?Hypokalemia, mild ?Hyponatremia, improved  ?P ?-dc FWF ?-replace lytes as needed (will give K 3/17 with plan for diuresis)  ? ?Acute urinary retention ?Hematuria, improving ?P ?- CBI clamped per uro  ?- appreciate uro following  ?- Finasteride ? ?Cirrhosis  ?P ?-outpt  follow up pending course / [place]  ? ?Anemia, multifactorial - stable  ?-ABLA, Anemia of chronic disease anemia of critical illness ?-1 PRBC 3/15 after traumatic thora  ?P ?- follow CBC  ?- transfuse if < 7 ? ?Ventral hernia containing unobstructed small bowel and colon ?P ?-outpt follow up  ? ?Goals of Care  ?-difficult situation for this pt --has been critically ill essentially since presenting to hospital with myriad of resp and hemodynamic concerns + renal + uro concerns  ?-ultimately intubated 3/14 and unfortunately hasnt made great improvements on vent ?-Also of note are CT findings which are concerning for malignancy(/ies). Bladder mass (first seen in 2018) R renal mass (found on RUQ this admission) and then pancreatic mass (found on CT recently this admission)  ?-I have shared these findings with the patient & family.  ?-Plan for family meeting with palliative care 3/17 afternoon. I think working toward a 1-way extubation with a time limited trial of MV is appropriate.  ?-Code status is partial at this time -- MV only  ? ? ?Best Practice (right click and "Reselect all SmartList Selections" daily)  ? ?Diet/type: Regular consistency (see orders) and  NPO ?DVT prophylaxis: systemic heparin ?GI prophylaxis: N/A ?Lines: N/A ?Foley:  N/A ?Code Status:  limited -- no CPR ?Last date of multidisciplinary goals of care discussion [3/16 discussed with daughters via phone]  ? ?Labs:  ? ?CMP Latest Ref Rng & Units 07/24/2021 07/23/2021 07/22/2021  ?Glucose 70 - 99 mg/dL 165(H) 150(H) 161(H)  ?BUN 8 - 23 mg/dL _0 ?Creatinine 0.61 - 1.24 mg/dL 0.75 0.80 0.88  ?Sodium 135 - 145 mmol/L 145 145 147(H)  ?Potassium 3.5 - 5.1 mmol/L 3.6 3.5 3.4(L)  ?Chloride 98 - 111 mmol/L 107 105 109  ?CO2 22 - 32 mmol/L _1 ?Calcium 8.9 - 10.3 mg/dL 7.5(L) 7.1(L) 7.2(L)  ?Total Protein 6.5 - 8.1 g/dL 5.2(L) 5.0(L) -  ?Total Bilirubin 0.3 - 1.2 mg/dL 0.3 0.3 -  ?Alkaline Phos 38 - 126 U/L 75 79 -  ?AST 15 - 41 U/L 16 14(L) -  ?ALT 0 - 44  U/L 9 8 -  ? ? ?CBC Latest Ref Rng & Units 07/24/2021 07/23/2021 07/22/2021  ?WBC 4.0 - 10.5 K/uL 10.2 12.2(H) -  ?Hemoglobin 13.0 - 17.0 g/dL 8.1(L) 7.5(L) 8.0(L)  ?Hematocrit 39.0 - 52.0 % 25.8(L) 24.2(L) 26

## 2021-07-24 NOTE — Progress Notes (Signed)
PT Cancellation Note ? ?Patient Details ?Name: Jason Holt. ?MRN: [id number] ?DOB: 11-17-1945 ? ? ?Cancelled Treatment:    Reason Eval/Treat Not Completed: Fatigue/lethargy limiting ability to participate; patient politely shook his head when asked if he wanted to work on leg exercises in the bed, he had worked with OT earlier on arm exercises, but indicated too fatigued.  Will attempt another day. ? ? ?Reginia Naas ?07/24/2021, 3:58 PM ?Magda Kiel, PT ?Acute Rehabilitation Services ?HUTML:[phone] ?Office:[phone] ?07/24/2021 ? ?

## 2021-07-24 NOTE — Progress Notes (Signed)
Nutrition Follow-up ? ?DOCUMENTATION CODES:  ? ?Obesity unspecified ? ?INTERVENTION:  ? ?Tube feeding via Cortrak: ?Vital 1.5 at 55 ml/h (1320 ml per day) ?Prosource TF 45 ml BID ? ?Provides 2060 kcal, 111 gm protein, 1003 ml free water daily ? ? ?NUTRITION DIAGNOSIS:  ? ?Inadequate oral intake related to inability to eat as evidenced by NPO status. ?Ongoing.  ? ?GOAL:  ? ?Patient will meet greater than or equal to 90% of their needs ?Met with TF at goal.  ? ?MONITOR:  ? ?TF tolerance, Labs, Skin, I & O's, Weight trends ? ?REASON FOR ASSESSMENT:  ? ?Consult (Cortrak NGT placement) ?  ? ?ASSESSMENT:  ? ?76 year old male with PMH of hypertension and GERD admitted for respiratory failure initially treated for pneumonia and then found to have bilateral pulmonary emboli. He was given TPA on 3/5 with subjective improvement in his breathing but he remains on HFNC/BiPAP. ? ?Pt discussed during ICU rounds and with RN.  ?Plan for palliative care meeting today.  ? ?3/13 cortrak replaced; tip gastric  ?  ?Patient is currently intubated on ventilator support ?MV: 11.8 L/min ?Temp (24hrs), Avg:99.4 ?F (37.4 ?C), Min:98.1 ?F (36.7 ?C), Max:100.3 ?F (37.9 ?C) ? ?Medications reviewed and include: colace, lasix, protonix, miralax ?Precedex ?Fentanyl  ? ?Labs reviewed:  ?CBG's: 153-173 ? ?UOP: 5840 ml  ? ?Diet Order:   ?Diet Order   ? ?       ?  Diet NPO time specified  Diet effective now       ?  ? ?  ?  ? ?  ? ? ?EDUCATION NEEDS:  ? ?Not appropriate for education at this time ? ?Skin:  Skin Assessment: Skin Integrity Issues: ?Skin Integrity Issues:: Stage II ?Stage II: buttocks ? ?Last BM:  3/16 ? ?Height:  ? ?Ht Readings from Last 1 Encounters:  ?07/20/21 _0  (1.651 m)  ? ? ?Weight:  ? ?Wt Readings from Last 1 Encounters:  ?07/23/21 90.1 kg  ? ? ?Ideal Body Weight:  61.8 kg ? ?BMI:  Body mass index is 33.05 kg/m?. ? ?Estimated Nutritional Needs:  ? ?Kcal:  2000-2200 ? ?Protein:  110-120 grams ? ?Fluid:  >/= 2 L/day ? ?Lockie Pares.,  RD, LDN, CNSC ?See AMiON for contact information  ? ?

## 2021-07-24 NOTE — Progress Notes (Signed)
Pt extubated with family at bedside, pt bolused with pain meds and versed, pt is comfortable and family have no concerns. Pt continues on fentanyl drip and precedex drip. ?

## 2021-07-24 NOTE — Progress Notes (Addendum)
?                                                   ?Daily Progress Note  ? ?Patient Name: Jason Holt.       Date: 07/24/2021 ?DOB: July 05, 1945  Age: 76 y.o. MRN#: [id number] ?Attending Physician: Juanito Doom, MD ?Primary Care Physician: [facility] ?Admit Date: 06/28/2021 ? ?Reason for Consultation/Follow-up: Establishing goals of care ? ?Subjective: ?Remains on ventilator - RN reports increased agitation and need for increased medication for comfort/vent compliance throughout day ? ?Length of Stay: 17 ? ?Current Medications: ?Scheduled Meds:  ? ? ?Continuous Infusions: ?? dexmedetomidine (PRECEDEX) IV infusion    ?? dextrose    ?? fentaNYL infusion INTRAVENOUS    ? ? ?PRN Meds: ?acetaminophen **OR** acetaminophen, diphenhydrAMINE, fentaNYL, fentaNYL (SUBLIMAZE) injection, glycopyrrolate **OR** glycopyrrolate **OR** glycopyrrolate, midazolam, [DISCONTINUED] ondansetron **OR** ondansetron (ZOFRAN) IV, polyvinyl alcohol ? ?Physical Exam ?Constitutional:   ?   General: He is not in acute distress. ?   Appearance: He is ill-appearing.  ?   Comments: Alert to voice  ?Pulmonary:  ?   Comments: Remains on ventilator - poorly tolerated SBT ?Skin: ?   General: Skin is warm and dry.  ?         ? ?Vital Signs: BP (!) 126/54   Pulse (!) 56   Temp 99.1 ?F (37.3 ?C) (Axillary)   Resp (!) 27   Ht _0  (1.651 m)   Wt 90.1 kg   SpO2 97%   BMI 33.05 kg/m?  ?SpO2: SpO2: 97 % ?O2 Device: O2 Device: Ventilator ?O2 Flow Rate: O2 Flow Rate (L/min): 25 L/min ? ?Intake/output summary:  ?Intake/Output Summary (Last 24 hours) at 07/24/2021 1643 ?Last data filed at 07/24/2021 1400 ?Gross per 24 hour  ?Intake 2898.15 ml  ?Output 3450 ml  ?Net -551.85 ml  ? ?LBM: Last BM Date : 07/21/21 ?Baseline Weight: Weight: 100.7 kg ?Most recent weight: Weight: 90.1 kg ? ? ? ? ?Patient  Active Problem List  ? Diagnosis Date Noted  ?? Community acquired pneumonia   ?? Acute pulmonary edema (HCC)   ?? Anemia 07/17/2021  ?? Hypotension due to drugs 07/17/2021  ?? Acute kidney failure ([place])   ?? Goals of care, counseling/discussion   ?? Palliative care by specialist   ?? Acute metabolic encephalopathy 34/74/2595  ?? 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 Atelectasis 07/15/2021  ?? Normal anion gap metabolic acidosis 63/87/5643  ?? Tremor 07/15/2021  ?? Chronic pain 07/15/2021  ?? Opioid dependence ([place]) 07/15/2021  ?? Acute pulmonary embolism ([place]) 07/10/2021  ?? Leukocytosis 07/09/2021  ?? Acute hypoxemic respiratory failure ([place]) 06/29/2021  ?? AKI (acute kidney injury) ([place]) 06/13/2021  ?? Pressure injury of skin 07/06/2021  ?? AMS (altered mental status) 06/08/2019  ?? Depression 06/08/2019  ?? Essential hypertension 06/08/2019  ?? GERD (gastroesophageal reflux disease) 08/15/2013  ? ? ?Palliative Care Assessment & Plan  ? ?HPI: ?76 y.o. male  with past medical history of chronic low back pain, hypertension, GERD, and chronic knee pain admitted on 07/06/2021 for low oxygen.  He was initially diagnosed with pneumonia and sepsis and also required BiPAP for several days.  He was transferred to [facility] March 4 and found to have a saddle PE.  On March 7 he was found to have rising creatinine and kidney  function has continued to worsen.  On March 9 his mental status became more altered.  PMT consulted to discuss goals of care. ? ?Assessment: ?Met with patient's 2 daughters and 2 grandsons along with Dr. Lake Bells and Shirlee Limerick NP. Patient's condition was reviewed and poor prognosis discussed. Reviewed patient's previously stated wishes to not remain on ventilator. Patient later included in conversation and communicated that he would like to be made comfortable. After extensive discussion about reasoning for recommendation of comfort care and the process of transitioning to comfort measures only and  liberating from the ventilator, decision was made to proceed with comfort measures only. Remain with family during extubation - requested medication administration from RN as determined by patient's symptoms. Emotional support provided.  ? ?Recommendations/Plan: ?Comfort measures only ?One way extubation ?Anticipate hospital death ? ? ?Care plan was discussed with family, RN, Dr. Clydia Llano ? ?Thank you for allowing the Palliative Medicine Team to assist in the care of this patient. ? ?*Please note that this is a verbal dictation therefore any spelling or grammatical errors are due to the "[place] One" system interpretation. ? ?110 minutes ? ?Juel Burrow, DNP, AGNP-C ?Palliative Medicine Team ?Team Phone # [phone]  ?Pager [phone] ? ?

## 2021-07-24 NOTE — Progress Notes (Signed)
Occupational Therapy Treatment ?Patient Details ?Name: Jason Holt. ?MRN: [id number] ?DOB: 02/03/1946 ?Today's Date: 07/24/2021 ? ? ?History of present illness 76 year old gentleman who presented to [facility] 2/28 with pneumonia and sepsis; transfered to [facility] [place] 3/4 for intervention of a large saddle pulmonary embolus s/p TPA on 07/12/21.  Had AKI also due to hypotension and contrast.  PMHx: Acid reflux, Chronic knee pain, Depression, Hypertension, Ruptured lumbar disc ?  ?OT comments ? Patient with increased alertness this session.  Able to tolerate chair position in bed.  Participated with bilateral upper body exercise as described below with cues, demo and guidance with active assist.  OT also worked on neck rotation and ext/flex of his neck seated.  Patient was able to complete oral care seated with setup and Min A to guide foam to his mouth.  OT will continue to follow in the acute setting, OT to order tilt bed, and hopefully begin to progress bed mobility and assist with transition to post acute level below.    ? ?Recommendations for follow up therapy are one component of a multi-disciplinary discharge planning process, led by the attending physician.  Recommendations may be updated based on patient status, additional functional criteria and insurance authorization. ?   ?Follow Up Recommendations ? OT at Long-term acute care hospital  ?  ?Assistance Recommended at Discharge Frequent or constant Supervision/Assistance  ?Patient can return home with the following ? Two people to help with walking and/or transfers;Two people to help with bathing/dressing/bathroom;Assistance with feeding;Direct supervision/assist for medications management;Direct supervision/assist for financial management;Assist for transportation;Help with stairs or ramp for entrance ?  ?Equipment Recommendations ? BSC/3in1;Wheelchair (measurements OT);Wheelchair cushion (measurements OT)  ?  ?Recommendations for Other Services   ? ?   ?Precautions / Restrictions Precautions ?Precautions: Fall ?Precaution Comments: Vent, NG tube, foley, multiple lines ?Restrictions ?Weight Bearing Restrictions: No  ? ? ?  ? ?Mobility Bed Mobility ?  ?Bed Mobility: Rolling ?  ?  ?Supine to sit: Max assist ?  ?  ?  ?Patient Response: Restless ? ?Transfers ?  ?  ?  ?  ?  ?  ?  ?  ?  ?  ?  ?  ?Balance   ?  ?  ?  ?  ?  ?  ?  ?  ?  ?  ?  ?  ?  ?  ?  ?  ?  ?  ?   ? ?ADL either performed or assessed with clinical judgement  ? ?ADL   ?  ?  ?Grooming: Oral care;Minimal assistance ?  ?  ?  ?  ?  ?  ?  ?Lower Body Dressing: Total assistance ?  ?  ?  ?  ?  ?  ?  ?  ?  ?  ? ?Extremity/Trunk Assessment Upper Extremity Assessment ?Upper Extremity Assessment: Overall WFL for tasks assessed ?  ?Lower Extremity Assessment ?Lower Extremity Assessment: Defer to PT evaluation ?  ?Cervical / Trunk Assessment ?Cervical / Trunk Assessment: Kyphotic ?  ? ?   ?  ?  ?   ?  ?   ?  ? ?Cognition Arousal/Alertness: Awake/alert ?Behavior During Therapy: Restless ?Overall Cognitive Status: Difficult to assess ?  ?  ?  ?  ?  ?  ?  ?  ?  ?  ?  ?  ?  ?  ?  ?  ?General Comments: giving thumbs up/down pretty consistently. ?  ?  ?   ?Exercises General Exercises -  Upper Extremity ?Shoulder Flexion: AAROM, 10 reps, Seated ?Elbow Flexion: AAROM, Seated ?Elbow Extension: AAROM, Seated ?Wrist Flexion: AROM, Seated ?Wrist Extension: AROM, Seated ?Digit Composite Flexion: AROM, Seated ?Composite Extension: AROM, Seated ?Other Exercises ?Other Exercises: neck rotation and extension/flexion seated 10 reps AROM ? ?  ?Shoulder Instructions   ? ? ?  ?General Comments    ? ? ?Pertinent Vitals/ Pain       Pain Assessment ?Pain Assessment: Faces ?Faces Pain Scale: Hurts a little bit ?Pain Location: generalized ?Pain Descriptors / Indicators: Restless ?Pain Intervention(s): Monitored during session, RN gave pain meds during session ? ?   ?  ?  ?  ?  ?  ?  ?  ?  ?  ?  ?  ?  ?  ?  ?  ?  ?  ?  ? ?  ?    ?  ?  ?  ?    ? ?Frequency ? Min 2X/week  ? ? ? ? ?  ?Progress Toward Goals ? ?OT Goals(current goals can now be found in the care plan section) ?   ? ?Acute Rehab OT Goals ?OT Goal Formulation: Patient unable to participate in goal setting ?Time For Goal Achievement: 07/29/21 ?Potential to Achieve Goals: Fair  ?Plan Discharge plan remains appropriate   ? ?Co-evaluation ? ? ?   ?  ?  ?  ?  ? ?  ?AM-PAC OT "6 Clicks" Daily Activity     ?Outcome Measure ? ? Help from another person eating meals?: Total ?Help from another person taking care of personal grooming?: A Little ?Help from another person toileting, which includes using toliet, bedpan, or urinal?: Total ?Help from another person bathing (including washing, rinsing, drying)?: A Lot ?Help from another person to put on and taking off regular upper body clothing?: Total ?Help from another person to put on and taking off regular lower body clothing?: Total ?6 Click Score: 9 ? ?  ?End of Session   ? ?OT Visit Diagnosis: Unsteadiness on feet (R26.81);Other abnormalities of gait and mobility (R26.89);Muscle weakness (generalized) (M62.81);Other symptoms and signs involving cognitive function;Dizziness and giddiness (R42);Pain ?  ?Activity Tolerance Patient tolerated treatment well ?  ?Patient Left in bed;with call bell/phone within reach ?  ?Nurse Communication Need for lift equipment ?  ? ?   ? ?Time: 4540-9811 ?OT Time Calculation (min): 16 min ? ?Charges: OT General Charges ?$OT Visit: 1 Visit ?OT Treatments ?$Self Care/Home Management : 8-22 mins ? ?07/24/2021 ? ?RP, OTR/L ? ?Acute Rehabilitation Services ? ?Office:  [phone] ? ? ?Dwain Huhn D Ikaika Showers ?07/24/2021, 10:02 AM ? ? ?

## 2021-07-24 NOTE — Plan of Care (Signed)
?  Interdisciplinary Goals of Care Family Meeting ? ? ?Date carried out:: 07/24/2021 ? ?Location of the meeting: Conference room ? ?Member's involved: Physician, Nurse Practitioner, Family Member or next of kin, and Palliative care team member ? ?Durable Power of Tour manager: Daughters Scientist, research (life sciences) and [place]   ? ?Discussion: We discussed goals of care for [name]. .  76 y/o male with severe acute respiratory failure with hypoxemia due to bilateral submassive PE and HCAP.  His condition has continued to worsen since admission to [facility] 3 weeks ago.  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  Given his frail state and continued progression of his respiratory failure there is no conceivable way that he could survive this illness.    ?We noted that initially on admission he indicated that he did not want to go on life support, but he changed his mind when his daughters showed up.  So after further consideration they feel he would not want to suffer further.   ?We also met with the patient and Richardson Landry tells [place] that he agrees with focusing on comfort measures and withdrawing life support. ? ?Code status: Full DNR ? ?Disposition: In-patient comfort care ? ? ?Time spent for the meeting: 40 minutes ? ?Roselie Awkward ?07/24/2021, 4:30 PM ? ?

## 2021-07-24 NOTE — Plan of Care (Signed)

## 2021-07-24 NOTE — Progress Notes (Signed)
ANTICOAGULATION CONSULT NOTE ? ?Pharmacy Consult for heparin ?Indication: pulmonary embolus ? ?Allergies  ?Allergen Reactions  ? Influenza Virus Vacc Split Pf Hives  ? Dye Fdc Red [Red Dye]   ? ? ?Patient Measurements: ?Height: '5\' 5"'$  (165.1 cm) ?Weight: 90.1 kg (198 lb 10.2 oz) ?IBW/kg (Calculated) : 61.5 ?HEPARIN DW (KG): 82.9  ? ?Labs: ?Recent Labs  ?  07/22/21 ?0442 07/22/21 ?1240 07/22/21 ?1739 07/23/21 ?0615 07/24/21 ?0629  ?HGB 7.5*   < > 8.0* 7.5* 8.1*  ?HCT 24.7*   < > 26.2* 24.2* 25.8*  ?PLT 344  --   --  281 275  ?HEPARINUNFRC 0.54  --   --  0.45 0.45  ?CREATININE 0.88  --   --  0.80 0.75  ? < > = values in this interval not displayed.  ? ? ? ?Estimated Creatinine Clearance: 79.7 mL/min (by C-G formula based on SCr of 0.75 mg/dL). ? ? ?Assessment: ?70 YOM admitted for SOB, found to have acute PE with evidence of RHS (RV/LV ratio = 1.3) consistent with at least submassive PE. Patient transferred from [facility] to [facility] for possible IR intervention vs. thrombolytic. Patient is s/p systemic tPA on 3/5 and heparin was resumed.  ? ?New hematuria with clots noted on 3/11, CBI initiated. Hemoglobin trended down, s/p transfusion 3/12 - stable today, plt wnl. ? ?Heparin level remains therapeutic at 0.45 for lower goal 0.3-0.5 on heparin at 1500 units/hr. Hematuria resolved and CBI clamped 3/15, plan to resume if recurs per Urology. ? ?Goal of Therapy:  ?Heparin level 0.3-0.5 units/mL with new hematuria ?Monitor platelets by anticoagulation protocol: Yes ?  ?Plan:  ?Continue heparin at 1500 units/hr ?Recheck heparin level with AM labs, daily CBC ?Watch Hgb closely in setting of hematuria (improving) ? ? ?Arturo Morton, PharmD, BCPS ?Please check AMION for all [place] contact numbers ?Clinical Pharmacist ?07/24/2021 9:13 AM ?

## 2021-07-24 NOTE — Progress Notes (Signed)
Patient was compassionately extubated to room  air at 17:01 with family, RN & NP at the bedside.  ?

## 2021-08-08 NOTE — Progress Notes (Signed)
RN wasted 100 mL of Versed and 110 mL of Fentanyl in Stericycle with Lalla Brothers, RN.  ?

## 2021-08-08 NOTE — Death Summary Note (Signed)
?DEATH SUMMARY  ? ?Patient Details  ?Name: Jason Holt. ?MRN: [id number] ?DOB: [birth year] ? ?Admission/Discharge Information  ? ?Admit Date:  07/27/21  ?Date of Death: Date of Death: 2021/08/14  ?Time of Death: Time of Death: 25  ?Length of Stay: 18  ?Referring Physician: [facility]  ? ?Reason(s) for Hospitalization  ?Dyspnea ? ?Diagnoses  ?Preliminary cause of death:  ?Subassive pulmonary embolism ?Secondary Diagnoses (including complications and co-morbidities):  ?Principal Problem: ?  Acute pulmonary embolism ([place]) ?Active Problems: ?  GERD (gastroesophageal reflux disease) ?  Depression ?  Essential hypertension ?  Acute hypoxemic respiratory failure ([place]) ?  AKI (acute kidney injury) ([place]) ?  Pressure injury of skin ?  Leukocytosis ?  Pulmonary infarct [facility]) ?  Atelectasis ?  Normal anion gap metabolic acidosis ?  Tremor ?  Chronic pain ?  Opioid dependence ([place]) ?  Acute metabolic encephalopathy ?  Anemia ?  Hypotension due to drugs ?  Acute kidney failure ([place]) ?  Goals of care, counseling/discussion ?  Palliative care by specialist ?  Community acquired pneumonia ?  Acute pulmonary edema ([facility]) ? ? ?Brief Hospital Course (including significant findings, care, treatment, and services provided and events leading to death)  ?76 yo male presented to [facility] with dyspnea for 3 days.  EMS called on 2/27 and he had SpO2 in the 80's on room air.  He declined option to come to ER then.  Symptoms got worse and he came to ER.  SpO2 in 70's on room air.  Found to have Rt sided pneumonia.  Started on IV fluids and antibiotics for sepsis, and Bipap with oxygen for respiratory failure.  Had persistent hypoxia and had CT angiogram showed acute saddle PE.  Transferred to [facility] for further management. ? ? ?Significant Hospital Events: ?Including procedures, antibiotic start and stop dates in addition to other pertinent events   ?27-Jul-2022 admission, treatment for pneumonia with ceftriaxone/azithro, elevated  troponin, treated with BIPAP ?3/1 TTE> LVEF 55-60%, mild LVH, D shaped septum, evidence of RV overload ?3/3 found to have saddle PE on CT angio, patchy ggo bilaterally; negative lower ext doppler, started on heparin ?3/4 moved to [facility] for persistent hypoxemia, remains on HHF ?3/5 no change in oxygen needs, given full dose TPA ?3/6 weaned O2 needs, tolerating diet. ?3/7 nephro consulted d/t rising cr. They felt mixed picture of Hypotension and Contrast dye injury.  Received Lasix IV. ?3/8 still on 50% heated high flow. Renal fxn worse.  Added cefepime.  For possible hcap, MRSA PCR again negative.  Bicarbonate supplementation started ?3/9: Renal function continues to worsen.  Urine output dropping.  Increasing bicarbonate, adding back gentle IV hydration.  No improvement in oxygen requirements ?3/10 on Precedex and BIPAP. BP boarderline hypotension, bradycardia all 2/2 dex. Increased edema on CXR. Place CVL for co-ox monitoring and CVP, creatinine hitting plateau and UOP picking up  ?3/13 Less confused per nursing, continued  high oxygen demand ?3/14 [place] mtg with family -- changed from DNR to partial and wanted elective intubation with worsening resp failure ?3/15 CT hematuria -- R renal mass, pancreatic head/neck mass, bladder mass. Cirrhosis.  Had a thora for R pleural effusion, complicated by bleeding and post-procedural ptx. 1 PRBC  ?3/16 diuresing. Introduced idea of time trial on vent / 1-way extubation  ?3/17 plan for family meeting with palliative care to discuss [place]  ? ?On 3/17 after discussion with the patient and his family we withdrew care per patient request.  He died  on comfort measures. ? ?Pertinent Labs and Studies  ?Significant Diagnostic Studies ?CT CHEST WO CONTRAST ? ?Result Date: 07/21/2021 ?CLINICAL DATA:  Pneumonia. Complication suspected. Respiratory distress. On ventilator. EXAM: CT CHEST WITHOUT CONTRAST TECHNIQUE: Multidetector CT imaging of the chest was performed following the standard  protocol without IV contrast. RADIATION DOSE REDUCTION: This exam was performed according to the departmental dose-optimization program which includes automated exposure control, adjustment of the mA and/or kV according to patient size and/or use of iterative reconstruction technique. COMPARISON:  AP chest 07/21/2021, 07/20/2021, 07/18/2021, 07/08/2021; CTA chest 07/10/2021 FINDINGS: An endotracheal tube terminates approximately 3.3 cm above the carina. An enteric tube courses into the stomach with the distal tip excluded by collimation. Left subclavian approach central venous catheter tip overlies the central superior vena cava. Cardiovascular: Heart size is moderately enlarged, unchanged. Dense coronary artery and aortic root calcifications are seen. No thoracic aortic aneurysm. Mild calcifications within the thoracic aorta. Note is made that extensive bilateral pulmonary emboli were seen on contrasted 07/10/2021 CT. Lack of IV contrast precludes evaluation of these prior emboli. The main pulmonary artery measures up to 3.3 cm in caliber again enlarged as can be seen with chronic pulmonary arterial hypertension. Mediastinum/Nodes: No axillary mediastinal or hilar pathologically enlarged lymph nodes by CT criteria. Subcentimeter short axis right paratracheal lymph nodes are noted. The visualized thyroid is grossly unremarkable. The esophagus follows a normal course. Lungs/Pleura: The central airways are patent. Interval increase in now moderate right and small left pleural effusions. Interval worsening in now moderate to high-grade patchy bilateral ground-glass opacities and mild bilateral consolidative opacities. No pneumothorax. Upper Abdomen: No acute abnormality. Musculoskeletal: Mild-to-moderate multilevel degenerative disc changes of the thoracic spine. IMPRESSION:: IMPRESSION: 1. Endotracheal tube in appropriate position. 2. Moderate cardiomegaly. 3. Interval increase in bilateral ground-glass lung opacities  and mild consolidative opacities suspicious for multifocal infection. 4. Interval increase in now moderate right and small left pleural effusions. 5. Note is made of bilateral pulmonary emboli on the prior contrasted CT 07/10/2021. Lack of IV contrast on the current study precludes evaluation of the prior emboli. Aortic Atherosclerosis (ICD10-I70.0). Electronically Signed   By: Yvonne Kendall M.D.   On: 07/21/2021 20:30  ? ?CT Angio Chest Pulmonary Embolism (PE) W or WO Contrast ? ?Result Date: 07/10/2021 ?CLINICAL DATA:  Severe sepsis, hypoxemia, tachypnea EXAM: CT ANGIOGRAPHY CHEST WITH CONTRAST TECHNIQUE: Multidetector CT imaging of the chest was performed using the standard protocol during bolus administration of intravenous contrast. Multiplanar CT image reconstructions and MIPs were obtained to evaluate the vascular anatomy. RADIATION DOSE REDUCTION: This exam was performed according to the departmental dose-optimization program which includes automated exposure control, adjustment of the mA and/or kV according to patient size and/or use of iterative reconstruction technique. CONTRAST:  4m OMNIPAQUE IOHEXOL 350 MG/ML SOLN COMPARISON:  None. FINDINGS: Cardiovascular: Satisfactory opacification of the pulmonary arteries to the segmental level. Nonocclusive pulmonary emboli involving the bifurcation of the main pulmonary artery extending into the left upper and lower lobar, segmental and subsegmental pulmonary arteries. Small pulmonary embolus in the right upper lobar pulmonary artery extending into the segmental and subsegmental branches. Small pulmonary artery emboli in the right lower lobe segmental branches. Normal heart size. No pericardial effusion. RV/LV ratio 1.3. Mediastinum/Nodes: No enlarged mediastinal, hilar, or axillary lymph nodes. Thyroid gland, trachea, and esophagus demonstrate no significant findings. Lungs/Pleura: Bilateral perihilar interstitial thickening and ground-glass opacities concerning  for pulmonary edema. Small right pleural effusion. Trace left pleural effusion. No pneumothorax. Upper Abdomen: No acute  abnormality. Reflux of contrast into the IVC and hepatic veins. Musculoskeletal: No acut

## 2021-08-08 DEATH — deceased
